# Patient Record
Sex: Female | Born: 1955
Health system: Southern US, Community
[De-identification: ages and names within clinical notes are randomized; demographics above are authoritative.]

## PROBLEM LIST (undated history)

## (undated) DIAGNOSIS — N200 Calculus of kidney: Secondary | ICD-10-CM

## (undated) DIAGNOSIS — E785 Hyperlipidemia, unspecified: Secondary | ICD-10-CM

## (undated) DIAGNOSIS — I10 Essential (primary) hypertension: Secondary | ICD-10-CM

## (undated) DIAGNOSIS — G8929 Other chronic pain: Secondary | ICD-10-CM

## (undated) DIAGNOSIS — F329 Major depressive disorder, single episode, unspecified: Secondary | ICD-10-CM

## (undated) DIAGNOSIS — G4733 Obstructive sleep apnea (adult) (pediatric): Secondary | ICD-10-CM

## (undated) DIAGNOSIS — I4892 Unspecified atrial flutter: Secondary | ICD-10-CM

## (undated) DIAGNOSIS — M5136 Other intervertebral disc degeneration, lumbar region: Secondary | ICD-10-CM

## (undated) HISTORY — DX: Other intervertebral disc degeneration, lumbar region: M51.36

## (undated) HISTORY — DX: Hyperlipidemia, unspecified: E78.5

## (undated) HISTORY — DX: Essential (primary) hypertension: I10

## (undated) HISTORY — DX: Calculus of kidney: N20.0

## (undated) HISTORY — PX: APPENDECTOMY: SHX54

## (undated) HISTORY — PX: FOOT SURGERY: SHX648

## (undated) HISTORY — PX: ABDOMINAL HYSTERECTOMY: SHX81

## (undated) HISTORY — PX: KNEE ARTHROSCOPY: SUR90

## (undated) HISTORY — DX: Obstructive sleep apnea (adult) (pediatric): G47.33

## (undated) HISTORY — DX: Major depressive disorder, single episode, unspecified: F32.9

## (undated) HISTORY — PX: SHOULDER SURGERY: SHX246

## (undated) HISTORY — DX: Other chronic pain: G89.29

## (undated) HISTORY — DX: Unspecified atrial flutter: I48.92

## (undated) HISTORY — PX: CHOLECYSTECTOMY: SHX55

## (undated) HISTORY — DX: Morbid (severe) obesity due to excess calories: E66.01

---

## 2009-12-05 ENCOUNTER — Ambulatory Visit: Payer: Self-pay | Admitting: Family Medicine

## 2009-12-05 DIAGNOSIS — E559 Vitamin D deficiency, unspecified: Secondary | ICD-10-CM | POA: Insufficient documentation

## 2009-12-05 DIAGNOSIS — N951 Menopausal and female climacteric states: Secondary | ICD-10-CM | POA: Insufficient documentation

## 2009-12-05 DIAGNOSIS — E669 Obesity, unspecified: Secondary | ICD-10-CM | POA: Insufficient documentation

## 2009-12-05 DIAGNOSIS — R5381 Other malaise: Secondary | ICD-10-CM | POA: Insufficient documentation

## 2009-12-05 DIAGNOSIS — R5383 Other fatigue: Secondary | ICD-10-CM | POA: Insufficient documentation

## 2009-12-06 LAB — CONVERTED CEMR LAB
AST: 25 units/L (ref 0–37)
Albumin: 4.2 g/dL (ref 3.5–5.2)
BUN: 19 mg/dL (ref 6–23)
Calcium: 10.7 mg/dL — ABNORMAL HIGH (ref 8.4–10.5)
Chloride: 104 meq/L (ref 96–112)
Cholesterol: 197 mg/dL (ref 0–200)
Creatinine, Ser: 0.91 mg/dL (ref 0.40–1.20)
Glucose, Bld: 107 mg/dL — ABNORMAL HIGH (ref 70–99)
HCT: 44.4 % (ref 36.0–46.0)
HDL: 40 mg/dL (ref >39)
Hemoglobin: 14.4 g/dL (ref 12.0–15.0)
MCHC: 32.4 g/dL (ref 30.0–36.0)
RBC: 4.67 M/uL (ref 3.87–5.11)
RDW: 13.1 % (ref 11.5–15.5)
TSH: 2.794 microintl units/mL (ref 0.350–4.500)
Total CHOL/HDL Ratio: 4.9
Triglycerides: 141 mg/dL (ref <150)

## 2009-12-11 ENCOUNTER — Ambulatory Visit: Payer: Self-pay | Admitting: Family Medicine

## 2009-12-11 DIAGNOSIS — E538 Deficiency of other specified B group vitamins: Secondary | ICD-10-CM | POA: Insufficient documentation

## 2009-12-13 ENCOUNTER — Encounter: Payer: Self-pay | Admitting: Family Medicine

## 2009-12-14 ENCOUNTER — Encounter: Payer: Self-pay | Admitting: Family Medicine

## 2009-12-28 ENCOUNTER — Encounter: Payer: Self-pay | Admitting: Family Medicine

## 2010-01-10 ENCOUNTER — Ambulatory Visit: Payer: Self-pay | Admitting: Family Medicine

## 2010-01-17 ENCOUNTER — Ambulatory Visit
Admission: RE | Admit: 2010-01-17 | Discharge: 2010-01-17 | Payer: Self-pay | Source: Home / Self Care | Attending: Family Medicine | Admitting: Family Medicine

## 2010-01-17 ENCOUNTER — Encounter: Payer: Self-pay | Admitting: Family Medicine

## 2010-01-17 DIAGNOSIS — Z780 Asymptomatic menopausal state: Secondary | ICD-10-CM | POA: Insufficient documentation

## 2010-01-18 ENCOUNTER — Encounter: Payer: Self-pay | Admitting: Family Medicine

## 2010-01-29 DIAGNOSIS — E210 Primary hyperparathyroidism: Secondary | ICD-10-CM | POA: Insufficient documentation

## 2010-01-29 LAB — CONVERTED CEMR LAB
Calcium, Total (PTH): 10.5 mg/dL (ref 8.4–10.5)
PTH: 120.4 pg/mL — ABNORMAL HIGH (ref 14.0–72.0)

## 2010-02-06 ENCOUNTER — Encounter: Payer: Self-pay | Admitting: Family Medicine

## 2010-02-12 ENCOUNTER — Ambulatory Visit
Admission: RE | Admit: 2010-02-12 | Discharge: 2010-02-12 | Payer: Self-pay | Source: Home / Self Care | Attending: Family Medicine | Admitting: Family Medicine

## 2010-02-12 ENCOUNTER — Encounter: Payer: Self-pay | Admitting: Endocrinology

## 2010-02-12 ENCOUNTER — Ambulatory Visit
Admission: RE | Admit: 2010-02-12 | Discharge: 2010-02-12 | Payer: Self-pay | Source: Home / Self Care | Attending: Endocrinology | Admitting: Endocrinology

## 2010-02-12 LAB — CONVERTED CEMR LAB
Calcium, Total (PTH): 10.9 mg/dL — ABNORMAL HIGH (ref 8.4–10.5)
PTH: 163.8 pg/mL — ABNORMAL HIGH (ref 14.0–72.0)
Vit D, 25-Hydroxy: 24 ng/mL — ABNORMAL LOW (ref 30–89)

## 2010-02-13 NOTE — Assessment & Plan Note (Signed)
Summary: NOV menopausal syndrome   Vital Signs:  Patient profile:   55 year old female Height:      67.5 inches Weight:      254 pounds BMI:     39.34 O2 Sat:      96 % on Room air Temp:     98.3 degrees F oral Pulse rate:   80 / minute BP sitting:   141 / 91  (left arm) Cuff size:   large  Vitals Entered By: Payton Spark CMA (December 05, 2009 10:26 AM)  O2 Flow:  Room air CC: New to est.    Primary Care Provider:  Seymour Bars DO  CC:  New to est. .  History of Present Illness: 55 yo WF presents for NOV.  She is getting over a cold currently but o/w feels well.  She has been struggling with her weight since her hysterectomy with oophorectomy at age 55 for fibroids.  She has a fair diet and has tried 'diets' in the past and lost wt but every time, has regained.  She is currently not exercising.  Her mood has been up and down following a move here this year from [place] with her 3 children.  She has been on Estradiol x 5 yrs but continues to have hot flashes.  She ran out about 2 mos ago.  She is due for fasting labs, a mammogram, DEXA, etc.  Current Medications (verified): 1)  Estradiol 1 Mg Tabs (Estradiol) .... Take 1 Tab By Mouth Once Daily  Allergies (verified): 1)  ! Aleve (Naproxen Sodium) 2)  ! Diflucan (Fluconazole)  Past History:  Past Medical History: obesity kidney stones  Past Surgical History: appendectomy 05/29/76 arthroscopic knee surgery TAH with BSO for fibroids at age 55 cholecystectomy RTC repair (L ) 05-30-2007  Family History: mother died in her 52s, CHF, DM, CVA, HTN father died in his 75s ?  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  Has 3 daughters. Never smoked. Denies ETOH. No exercise.  Fair diet.  Review of Systems       no fevers/sweats/weakness, unexplained wt loss/gain, no change in vision, no difficulty hearing, ringing in ears, no hay fever/allergies, no CP/discomfort, no  palpitations, no breast lump/nipple discharge, no cough/wheeze, no blood in stool, no N/V/D, no nocturia, no leaking urine, no unusual vag bleeding, no vaginal/penile discharge, no muscle/joint pain, no rash, no new/changing mole, no HA, no memory loss, no anxiety, no sleep problem, no depression, no unexplained lumps, no easy bruising/bleeding, no concern with sexual function   Physical Exam  General:  alert, well-developed, well-nourished, and well-hydrated.  obese Head:  normocephalic and atraumatic.   Eyes:  conjunctiva clear Mouth:  pharynx pink and moist.  o/p injected Neck:  no masses.   Lungs:  Normal respiratory effort, chest expands symmetrically. Lungs are clear to auscultation, no crackles or wheezes. Heart:  Normal rate and regular rhythm. S1 and S2 normal without gallop, murmur, click, rub or other extra sounds. Skin:  color normal.   Cervical Nodes:  No lymphadenopathy noted Psych:  good eye contact, not anxious appearing, and flat affect.     Impression & Recommendations:  Problem # 1:  MENOPAUSAL SYNDROME (ICD-627.2) On HRT x 5 yrs following a TAH with BSO for fibroids.  Off Estradiol x 2 mos.  We discussed options and I think she would do better with bioidentical hormones.  Info given re: Med Environmental education officer.  Will update her labs today and will schedule a CPE in 6 wks and plan to do DEXA/ Mammo/ Colonoscopy/ immunizations if needed/ EKG.    Her updated medication list for this problem includes:    Estradiol 1 Mg Tabs (Estradiol) .[name]... Take 1 tab by mouth once daily  Complete Medication List: 1)  Estradiol 1 Mg Tabs (Estradiol) .... Take 1 tab by mouth once daily  Other Orders: T-CBC No Diff (16109-60454) T-TSH (09811-91478) T-Lipid Profile (29562-13086) T-Comprehensive Metabolic Panel [phone]) T-Vitamin B12 [id number]) T-Vitamin D (25-Hydroxy) (02725-36644)  Patient Instructions: 1)  Fasting labs today. 2)  Will call you w/ results  tomorrow. 3)  Check out bioidentical hormone testing/ replacement at [facility] in [place]. 4)  Call me if you have any questions. 5)  Set up a PHYSICAL in 6 wks.   Orders Added: 1)  T-CBC No Diff [[id number]] 2)  T-TSH [03474-25956] 3)  T-Lipid Profile [38756-43329] 4)  T-Comprehensive Metabolic Panel [80053-22900] 5)  T-Vitamin B12 [82607-23330] 6)  T-Vitamin D (25-Hydroxy) [51884-16606] 7)  New Patient Level III [30160]

## 2010-02-13 NOTE — Assessment & Plan Note (Signed)
Summary: B12 shot  Nurse Visit   Vitals Entered By: Payton Spark CMA (December 11, 2009 9:58 AM)  Allergies: 1)  ! Aleve (Naproxen Sodium) 2)  ! Diflucan (Fluconazole)  Medication Administration  Injection # 1:    Medication: Vit B12 1000 mcg    Diagnosis: UNSPECIFIED VITAMIN D DEFICIENCY (ICD-268.9)    Route: IM    Site: R deltoid    Exp Date: 09/2011    Lot #: [id number]    Patient tolerated injection without complications    Given by: Payton Spark CMA (December 11, 2009 9:58 AM)  Orders Added: 1)  Vit B12 1000 mcg [J3420] 2)  Admin of Therapeutic Inj  intramuscular or subcutaneous [96372] Prescriptions: ESTRADIOL 1 MG TABS (ESTRADIOL) Take 1 tab by mouth once daily  #30 x 1   Entered by:   Payton Spark CMA   Authorized by:   Seymour Bars DO   Signed by:   Payton Spark CMA on 12/11/2009   Method used:   Electronically to        [facility] [phone]* (retail)       [address] [place], [place]  [address]       Ph: [phone]       Fax: [phone]   RxID:   [id number]    Medication Administration  Injection # 1:    Medication: Vit B12 1000 mcg    Diagnosis: UNSPECIFIED VITAMIN D DEFICIENCY (ICD-268.9)    Route: IM    Site: R deltoid    Exp Date: 09/2011    Lot #: [id number]    Patient tolerated injection without complications    Given by: Payton Spark CMA (December 11, 2009 9:58 AM)  Orders Added: 1)  Vit B12 1000 mcg [J3420] 2)  Admin of Therapeutic Inj  intramuscular or subcutaneous [27253]

## 2010-02-15 NOTE — Medication Information (Signed)
Summary: Estradiol & Progesterone/[facility]  Estradiol & Progesterone/[facility]   Imported By: Lanelle Bal 01/12/2010 12:14:20  _____________________________________________________________________  External Attachment:    Type:   Image     Comment:   External Document

## 2010-02-15 NOTE — Assessment & Plan Note (Signed)
Summary: B-12 SHO  Nurse Visit   Vitals Entered By: Payton Spark CMA (January 10, 2010 10:06 AM)  Allergies: 1)  ! Aleve (Naproxen Sodium) 2)  ! Diflucan (Fluconazole)  Medication Administration  Injection # 1:    Medication: Vit B12 1000 mcg    Diagnosis: B12 DEFICIENCY (ICD-266.2)    Route: IM    Site: L deltoid    Exp Date: 08/2011    Lot #: [id number]    Patient tolerated injection without complications    Given by: Payton Spark CMA (January 10, 2010 10:07 AM)  Orders Added: 1)  Vit B12 1000 mcg [J3420] 2)  Admin of Therapeutic Inj  intramuscular or subcutaneous [96372]   Medication Administration  Injection # 1:    Medication: Vit B12 1000 mcg    Diagnosis: B12 DEFICIENCY (ICD-266.2)    Route: IM    Site: L deltoid    Exp Date: 08/2011    Lot #: [id number]    Patient tolerated injection without complications    Given by: Payton Spark CMA (January 10, 2010 10:07 AM)  Orders Added: 1)  Vit B12 1000 mcg [J3420] 2)  Admin of Therapeutic Inj  intramuscular or subcutaneous [[id number]]

## 2010-02-15 NOTE — Miscellaneous (Signed)
Summary: colonoscopy  Clinical Lists Changes  Observations: Added new observation of COLONRECACT: Further recommendations pending biopsy results.   (02/01/2010 12:55) Added new observation of COLONOSCOPY: Location:  [facility].    polyps in the sigmoid colon ( polypectomy) internal hemorrhoids o/w normal to distal 5 cm of TI  (02/01/2010 12:55)      Colonoscopy  Procedure date:  02/01/2010  Findings:      Location:  [facility].    polyps in the sigmoid colon ( polypectomy) internal hemorrhoids o/w normal to distal 5 cm of TI   Comments:      Further recommendations pending biopsy results.     Colonoscopy  Procedure date:  02/01/2010  Findings:      Location:  [facility].    polyps in the sigmoid colon ( polypectomy) internal hemorrhoids o/w normal to distal 5 cm of TI   Comments:      Further recommendations pending biopsy results.

## 2010-02-15 NOTE — Letter (Signed)
Summary: New Patient Info/[facility]  New Patient Info/[facility]   Imported By: Lanelle Bal 01/12/2010 12:11:11  _____________________________________________________________________  External Attachment:    Type:   Image     Comment:   External Document

## 2010-02-15 NOTE — Assessment & Plan Note (Signed)
Summary: CPE   Vital Signs:  Patient profile:   55 year old female Menstrual status:  hysterectomy Height:      67.5 inches Weight:      257 pounds BMI:     39.80 O2 Sat:      100 % on Room air Pulse rate:   67 / minute BP sitting:   150 / 77  (left arm) Cuff size:   large  Vitals Entered By: Payton Spark CMA (January 17, 2010 10:21 AM)  O2 Flow:  Room air CC: CPE      Menstrual Status hysterectomy   Primary Care Provider:  Seymour Bars DO  CC:  CPE .  History of Present Illness: 55 yo WF presents for CPE.    She is s/p TAH with oophorectomy for fibroids.  Recently replaced her oral estradiol withh compounded topical estrogen and progesterone creams thru [facility].  She still c/o fatigue.  She started B12 injections 2 mos ago for low B12.  Her TSH was normal.  Her Vit D was low.  Cholesterol OK other than a low HDL and she had a mildly elevated calcium, due to recheck today with an intact PTH.  She is due for her mammogram, DEXA and a screening colonoscopy. She had fasting labs in Nov. Denies fam hx of premature CVD, breast or colon cancer.  She continues to struggle with her weight along with daytime sleepiness, snoring and no exercise.    Current Medications (verified): 1)  Vitamin D 50,000 Iu Capsules .Marland KitchenMarland Kitchen. 1 Capsule By Mouth Once A Week 2)  Estradiol Cream .... Use As Directed 3)  Progesterone Cream .... Use As Directed  Allergies (verified): 1)  ! Aleve (Naproxen Sodium) 2)  ! Diflucan (Fluconazole)  Past History:  Past Medical History: Reviewed history from 12/05/2009 and no changes required. obesity kidney stones  Past Surgical History: Reviewed history from 12/05/2009 and no changes required. appendectomy 1978 arthroscopic knee surgery TAH with BSO for fibroids at age 67 cholecystectomy RTC repair (L ) 2009  Family History: Reviewed history from 12/05/2009 and no changes required. mother died in her 68s, CHF, DM, CVA, HTN father died in  his 74s ?  brother died in his 53s- DM 2 brothers Alive  Social History: Reviewed history from 12/05/2009 and no changes required. 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  Has 3 daughters. Never smoked. Denies ETOH. No exercise.  Fair diet.  Review of Systems       The patient complains of weight gain and headaches.  The patient denies anorexia, fever, weight loss, vision loss, decreased hearing, hoarseness, chest pain, syncope, dyspnea on exertion, peripheral edema, prolonged cough, hemoptysis, abdominal pain, melena, hematochezia, severe indigestion/heartburn, hematuria, incontinence, genital sores, muscle weakness, suspicious skin lesions, transient blindness, difficulty walking, depression, unusual weight change, abnormal bleeding, enlarged lymph nodes, angioedema, breast masses, and testicular masses.    Physical Exam  General:  alert, well-developed, well-nourished, and well-hydrated.  obese Head:  normocephalic and atraumatic.   Eyes:  pupils equal, pupils round, and pupils reactive to light.  wears glasses Ears:  no external deformities.   Nose:  no nasal discharge.   Mouth:  good dentition and pharynx pink and moist.  low hanging uvula with mild underbite Neck:  supple and no masses.   Breasts:  No mass, nodules, thickening, tenderness, bulging, retraction, inflamation, nipple discharge or skin changes noted.   Lungs:  Normal respiratory effort, chest expands symmetrically. Lungs are clear to auscultation, no crackles or wheezes.  Heart:  Normal rate and regular rhythm. S1 and S2 normal without gallop, murmur, click, rub or other extra sounds. Abdomen:  Bowel sounds positive,abdomen soft and non-tender without masses, organomegaly or hernias noted. Pulses:  2+ radial and pedal pulses no AA bruits Extremities:  no LE edema Skin:  color normal and no suspicious lesions.   Cervical Nodes:  No lymphadenopathy noted Psych:  good eye contact, not anxious appearing, and not depressed appearing.      Impression & Recommendations:  Problem # 1:  HEALTH MAINTENANCE EXAM (ICD-V70.0) Keeping healthy checklist for women reviewed. BP high -- she will check at home and call me with home readings. Update mammogram and DEXA. Update Tdap and flu shot today. Repeat Calcium with iPTH (hypercalcemia on labs in Nov). BMI high at 39.8 c/w class II obesity.  Given obesity, fatigue --> will screen for OSA with a sleep study. Continue B12 injections + RX Vit D + MVI daily.  Repeat B12 and Vit D levels at 2 mos f/u visit. Work on [employer], regular exercise, wt loss.   Complete Medication List: 1)  Vitamin D 50,000 Iu Capsules  .[name].. 1 capsule by mouth once a week 2)  Estradiol Cream  .... Use as directed 3)  Progesterone Cream  .... Use as directed  Other Orders: Sleep Study (Sleep Study) Gastroenterology Referral (GI) T-Mammography Bilateral Screening (16109) T-Dual DXA Bone Density/ Axial (60454) T-DXA Bone Density/ Appendicular (09811)  Patient Instructions: 1)  Will set up Sleep study and colonoscopy. 2)  Tetanus vaccine updated today. 3)  Repeat calcium level today. 4)  Will call you w/ result tomorrow. 5)  Update mammogram and DEXA downstairs.   6)  Return for follow up fatigue/ WT/ BP in 2 mos.   Orders Added: 1)  Sleep Study [Sleep Study] 2)  Gastroenterology Referral [GI] 3)  T-Mammography Bilateral Screening [77057] 4)  T-Dual DXA Bone Density/ Axial [77080] 5)  T-DXA Bone Density/ Appendicular [77081] 6)  Est. Patient age 28-64 [64]  Appended Document: CPE     Allergies: 1)  ! Aleve (Naproxen Sodium) 2)  ! Diflucan (Fluconazole)   Complete Medication List: 1)  Vitamin D 50,000 Iu Capsules  .[name].. 1 capsule by mouth once a week 2)  Estradiol Cream  .... Use as directed 3)  Progesterone Cream  .... Use as directed  Other Orders: Tdap => 38yrs IM (91478) Admin 1st Vaccine (29562) Admin 1st Vaccine (13086) Flu Vaccine 88yrs + [id number])   Orders Added: 1)   Tdap => 30yrs IM [90715] 2)  Admin 1st Vaccine [90471] 3)  Admin 1st Vaccine [90471] 4)  Flu Vaccine 51yrs + [96295]   Immunizations Administered:  Tetanus Vaccine:    Vaccine Type: Tdap    Site: right deltoid    Dose: 0.5 ml    Route: IM    Given by: Payton Spark CMA    Exp. Date: 11/03/2011    Lot #: [id number]    VIS given: 12/02/07 version given January 17, 2010.   Immunizations Administered:  Tetanus Vaccine:    Vaccine Type: Tdap    Site: right deltoid    Dose: 0.5 ml    Route: IM    Given by: Payton Spark CMA    Exp. Date: 11/03/2011    Lot #: [id number]    VIS given: 12/02/07 version given January 17, 2010. Flu Vaccine Consent Questions     Do you have a history of severe allergic reactions to this vaccine? no  Any prior history of allergic reactions to egg and/or gelatin? no    Do you have a sensitivity to the preservative Thimersol? no    Do you have a past history of Guillan-Barre Syndrome? no    Do you currently have an acute febrile illness? no    Have you ever had a severe reaction to latex? no    Vaccine information given and explained to patient? yes    Are you currently pregnant? no    Lot Number:[id number]   Exp Date:07/14/2010   Site Given  Left Deltoid [id number]    VIS given: 12/02/07 version given January 17, 2010.     Marland Kitchenlbflu

## 2010-02-19 ENCOUNTER — Encounter (HOSPITAL_BASED_OUTPATIENT_CLINIC_OR_DEPARTMENT_OTHER): Payer: Self-pay

## 2010-02-19 ENCOUNTER — Encounter: Payer: Self-pay | Admitting: Family Medicine

## 2010-02-21 NOTE — Assessment & Plan Note (Signed)
Summary: B-2 SHOT-VEW  Nurse Visit   Vitals Entered By: Payton Spark CMA (February 12, 2010 10:00 AM)  Allergies: 1)  ! Aleve (Naproxen Sodium) 2)  ! Diflucan (Fluconazole)  Medication Administration  Injection # 1:    Medication: Vit B12 1000 mcg    Diagnosis: B12 DEFICIENCY (ICD-266.2)    Route: IM    Site: R deltoid    Exp Date: 10/2011    Lot #: [id number]    Patient tolerated injection without complications    Given by: Payton Spark CMA (February 12, 2010 10:00 AM)  Orders Added: 1)  Vit B12 1000 mcg [J3420] 2)  Admin of Therapeutic Inj  intramuscular or subcutaneous [96372]   Medication Administration  Injection # 1:    Medication: Vit B12 1000 mcg    Diagnosis: B12 DEFICIENCY (ICD-266.2)    Route: IM    Site: R deltoid    Exp Date: 10/2011    Lot #: [id number]    Patient tolerated injection without complications    Given by: Payton Spark CMA (February 12, 2010 10:00 AM)  Orders Added: 1)  Vit B12 1000 mcg [J3420] 2)  Admin of Therapeutic Inj  intramuscular or subcutaneous [66440]

## 2010-03-01 NOTE — Assessment & Plan Note (Signed)
Summary: NEW [facility]-HYPERPARATHY-HYPERCALCEMIA-JENNIFER/DR BOWEN-#-PKG-STC   Vital Signs:  Patient profile:   55 year old female Menstrual status:  hysterectomy Height:      67.5 inches (171.45 cm) Weight:      259.13 pounds (117.79 kg) BMI:     40.13 O2 Sat:      93 % on Room air Temp:     98.6 degrees F (37.00 degrees C) oral Pulse rate:   67 / minute BP sitting:   114 / 80  (left arm) Cuff size:   large  Vitals Entered By: Brenton Grills CMA Duncan Dull) (February 12, 2010 3:45 PM)  O2 Flow:  Room air CC: New Endo Consult/Hyperparathyroid/Hypercalcemia/aj Is Patient Diabetic? No   Referring Provider:  Seymour Bars DO Primary Provider:  Seymour Bars DO  CC:  New Endo Consult/Hyperparathyroid/Hypercalcemia/aj.  History of Present Illness: pt states few weeks of moderate gerd sxs in  the chest, and assoc fatigue.  she was noted to have low b-12, low vit-d, high pth, and hypercalcemia.   she had urolithiasis 4 years--she has eswl (in [place]).   no records are available, but pt believes her ca++ was normal as recently as 1 year ago.    Current Medications (verified): 1)  Vitamin D 50,000 Iu Capsules .Marland KitchenMarland Kitchen. 1 Capsule By Mouth Once A Week 2)  Estradiol Cream .... Use As Directed 3)  Progesterone Cream .... Use As Directed 4)  Vitamin D 1000 Unit Tabs (Cholecalciferol) .[name].. 1 Tablet By Mouth Once Daily 5)  Cyanocobalamin 1000 Mcg/ml Soln (Cyanocobalamin) .[name].. 1 Injection Im Once Monthly  Allergies (verified): 1)  ! Aleve (Naproxen Sodium) 2)  ! Diflucan (Fluconazole)  Past History:  Past Medical History: PRIMARY HYPERPARATHYROIDISM (ICD-252.01) HEALTH MAINTENANCE EXAM (ICD-V70.0) OTHER SCREENING MAMMOGRAM (ICD-V76.12) POSTMENOPAUSAL STATUS (ICD-V49.81) SPECIAL SCREENING FOR MALIGNANT NEOPLASMS COLON (ICD-V76.51) B12 DEFICIENCY (ICD-266.2) HYPERCALCEMIA (ICD-275.42) OBESITY, UNSPECIFIED (ICD-278.00) MENOPAUSAL SYNDROME (ICD-627.2) OTH&UNSPEC ENDOCRN NUTRIT METAB&IMMUNITY D/O  (ICD-V77.99) SCREENING FOR LIPOID DISORDERS (ICD-V77.91) UNSPECIFIED VITAMIN D DEFICIENCY (ICD-268.9) FATIGUE (ICD-780.79)  Past Surgical History: appendectomy 1978 arthroscopic knee surgery-1997 TAH with BSO for fibroids at age 14 cholecystectomy-2007 RTC repair (L ) 2009 Hysterectomy-2003  Family History: Reviewed history from 12/05/2009 and no changes required. mother died in her 38s, CHF, DM, CVA, HTN father died in his 72s ?  brother died in his 46s- DM 2 brothers Alive.   neg for parathyroid probs.  Social History: Reviewed history from 12/05/2009 and no changes required. 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 Never smoked. Denies ETOH. No exercise.  Fair diet.  Review of Systems  The patient denies headaches.         denies weight loss, galactorrhea, hematuria, memory loss, numbness, abdominal pain, urinary frequency, hypoglycemia, skin rash, visual loss, sob, constipation, rhinorrhea,and depression.  she reports snoring (sleep study is scheduled).  she has no menses since tah at age 26.  she has a few diffuse arthralgias, easy bruising, and muscle weakness.   Physical Exam  General:  obese.  no distress  Head:  head: no deformity eyes: no periorbital swelling, no proptosis external nose and ears are normal mouth: no lesion seen Neck:  Supple without thyroid enlargement or tenderness.  Lungs:  Clear to auscultation bilaterally. Normal respiratory effort.  Heart:  Regular rate and rhythm without murmurs or gallops noted. Normal S1,S2.   Abdomen:  abdomen is soft, nontender.  no hepatosplenomegaly.   not distended.  no hernia  Msk:  muscle bulk and strength are grossly normal.  no obvious joint  swelling.  gait is normal and steady  Extremities:  no deformity.  no ulcer on the feet.  feet are of normal color and temp.   trace right pedal edema and trace left pedal edema.   Neurologic:  cn 2-12 grossly intact.   readily moves all 4's.   sensation is  intact to touch on the feet  Skin:  normal texture and temp.  no rash.  not diaphoretic  Cervical Nodes:  No significant adenopathy.  Psych:  Alert and cooperative; normal mood and affect; normal attention span and concentration.     Impression & Recommendations:  Problem # 1:  PRIMARY HYPERPARATHYROIDISM (ICD-252.01) in view of young age and urolithiasis, she needs surgery.  Problem # 2:  UNSPECIFIED VITAMIN D DEFICIENCY (ICD-268.9) she should continue supplement, despite the fact that it could exac hypecalcemia.  Problem # 3:  gerd sxs could be exac by #1  Medications Added to Medication List This Visit: 1)  Vitamin D 1000 Unit Tabs (Cholecalciferol) .[name].. 1 tablet by mouth once daily 2)  Cyanocobalamin 1000 Mcg/ml Soln (Cyanocobalamin) .[name].. 1 injection im once monthly  Other Orders: T-Parathyroid Hormone, Intact w/ Calcium (95284-13244) T-Vitamin D (25-Hydroxy) (01027-25366) Surgical Referral (Surgery) Consultation Level IV (44034)  Patient Instructions: 1)  blood tests are being ordered for you today.  please call [phone] to hear your test results. 2)  refer to surgery.  you will be called with a day and time for an appointment. 3)  (update: i left message on phone-tree:  rx as we discussed.  continue vit-d supplement)   Orders Added: 1)  T-Parathyroid Hormone, Intact w/ Calcium [38756-43329] 2)  T-Vitamin D (25-Hydroxy) [51884-16606] 3)  Surgical Referral [Surgery] 4)  Consultation Level IV [30160]

## 2010-03-07 NOTE — Letter (Signed)
Summary: Letter with Path Results/[facility]  Letter with Path Results/[facility]   Imported By: Lanelle Bal 02/27/2010 12:41:30  _____________________________________________________________________  External Attachment:    Type:   Image     Comment:   External Document

## 2010-03-12 ENCOUNTER — Ambulatory Visit: Payer: Self-pay | Admitting: Family Medicine

## 2010-03-12 ENCOUNTER — Ambulatory Visit (INDEPENDENT_AMBULATORY_CARE_PROVIDER_SITE_OTHER): Payer: Commercial Managed Care - PPO

## 2010-03-12 ENCOUNTER — Encounter: Payer: Self-pay | Admitting: Family Medicine

## 2010-03-12 DIAGNOSIS — E538 Deficiency of other specified B group vitamins: Secondary | ICD-10-CM

## 2010-03-16 ENCOUNTER — Ambulatory Visit: Payer: Self-pay | Admitting: Family Medicine

## 2010-03-16 ENCOUNTER — Encounter: Payer: Self-pay | Admitting: Endocrinology

## 2010-03-16 ENCOUNTER — Other Ambulatory Visit (HOSPITAL_COMMUNITY): Payer: Self-pay | Admitting: Surgery

## 2010-03-16 DIAGNOSIS — E213 Hyperparathyroidism, unspecified: Secondary | ICD-10-CM

## 2010-03-22 NOTE — Assessment & Plan Note (Signed)
Summary: B12/dt  Nurse Visit   Vitals Entered By: Payton Spark CMA (March 12, 2010 10:16 AM)  Allergies: 1)  ! Aleve (Naproxen Sodium) 2)  ! Diflucan (Fluconazole)  Medication Administration  Injection # 1:    Medication: Vit B12 1000 mcg    Diagnosis: B12 DEFICIENCY (ICD-266.2)    Route: IM    Site: R deltoid    Patient tolerated injection without complications    Given by: Payton Spark CMA (March 12, 2010 10:17 AM)  Orders Added: 1)  Vit B12 1000 mcg [J3420] 2)  Admin of Therapeutic Inj  intramuscular or subcutaneous [96372]   Medication Administration  Injection # 1:    Medication: Vit B12 1000 mcg    Diagnosis: B12 DEFICIENCY (ICD-266.2)    Route: IM    Site: R deltoid    Patient tolerated injection without complications    Given by: Payton Spark CMA (March 12, 2010 10:17 AM)  Orders Added: 1)  Vit B12 1000 mcg [J3420] 2)  Admin of Therapeutic Inj  intramuscular or subcutaneous [16109]

## 2010-03-22 NOTE — Letter (Signed)
Summary: Patient Cancellation/[facility]  Patient Cancellation/[facility]   Imported By: Lanelle Bal 03/15/2010 12:36:13  _____________________________________________________________________  External Attachment:    Type:   Image     Comment:   External Document

## 2010-03-23 ENCOUNTER — Encounter: Payer: Self-pay | Admitting: Family Medicine

## 2010-03-23 ENCOUNTER — Other Ambulatory Visit: Payer: Self-pay | Admitting: Family Medicine

## 2010-03-23 ENCOUNTER — Ambulatory Visit (INDEPENDENT_AMBULATORY_CARE_PROVIDER_SITE_OTHER): Payer: Commercial Managed Care - PPO | Admitting: Family Medicine

## 2010-03-23 DIAGNOSIS — E210 Primary hyperparathyroidism: Secondary | ICD-10-CM

## 2010-03-23 DIAGNOSIS — E0590 Thyrotoxicosis, unspecified without thyrotoxic crisis or storm: Secondary | ICD-10-CM

## 2010-03-23 DIAGNOSIS — Z1231 Encounter for screening mammogram for malignant neoplasm of breast: Secondary | ICD-10-CM

## 2010-03-23 DIAGNOSIS — E669 Obesity, unspecified: Secondary | ICD-10-CM

## 2010-03-27 NOTE — Letter (Signed)
Summary: Dr Gerrit Friends Visit Note   Dr Gerrit Friends Visit Note   Imported By: Kassie Mends 03/23/2010 09:36:36  _____________________________________________________________________  External Attachment:    Type:   Image     Comment:   External Document

## 2010-03-27 NOTE — Assessment & Plan Note (Signed)
Summary: f/u hyperparathyroidism/ DEXA   Vital Signs:  Patient profile:   55 year old female Menstrual status:  hysterectomy Height:      67.5 inches Weight:      259 pounds BMI:     40.11 O2 Sat:      100 % on Room air Pulse rate:   62 / minute BP sitting:   128 / 78  (left arm) Cuff size:   large  Vitals Entered By: Payton Spark CMA (March 23, 2010 10:28 AM)  O2 Flow:  Room air CC: F/U fatigue, weight and BP   Primary Care Provider:  Seymour Bars DO  CC:  F/U fatigue and weight and BP.  History of Present Illness: 55 yo WF presents for f/u visit.  She has seen Dr Everardo All and then Dr Gerrit Friends for findings of primary hyperparathyroidism.  She continues to feel tired.  Her BP looks good.  She is set up for a nuclear test next wk and she did complete a 24 hr urine for calcium.  She is due for bone density scan.  Needs to RF her RX vitamin D.  She has yet to start exercise due to low energy level.  Has started to make dietary changes.     Current Medications (verified): 1)  Vitamin D 50,000 Iu Capsules .Marland KitchenMarland Kitchen. 1 Capsule By Mouth Once A Week 2)  Estradiol Cream .... Use As Directed 3)  Progesterone Cream .... Use As Directed 4)  Vitamin D 1000 Unit Tabs (Cholecalciferol) .[name].. 1 Tablet By Mouth Once Daily 5)  Cyanocobalamin 1000 Mcg/ml Soln (Cyanocobalamin) .[name].. 1 Injection Im Once Monthly  Allergies (verified): 1)  ! Aleve (Naproxen Sodium) 2)  ! Diflucan (Fluconazole)  Past History:  Past Medical History: PRIMARY HYPERPARATHYROIDISM (ICD-252.01) POSTMENOPAUSAL STATUS (ICD-V49.81) B12 DEFICIENCY (ICD-266.2) HYPERCALCEMIA (ICD-275.42) OBESITY, UNSPECIFIED (ICD-278.00) UNSPECIFIED VITAMIN D DEFICIENCY (ICD-268.9) FATIGUE (ICD-780.79)  Past Surgical History: Reviewed history from 02/12/2010 and no changes required. appendectomy 1978 arthroscopic knee surgery-1997 TAH with BSO for fibroids at age 55 cholecystectomy-2007 RTC repair (L ) 2009 Hysterectomy-2003  Social  History: Reviewed history from 12/05/2009 and no changes required. SAHM. Married to [place].  Has 3 daughters. Never smoked. Denies ETOH. No exercise.  Fair diet.  Review of Systems      See HPI  Physical Exam  General:  alert, well-developed, well-nourished, well-hydrated, and overweight-appearing.   Head:  normocephalic and atraumatic.   Mouth:  pharynx pink and moist.   Neck:  no masses and no thyromegaly.   Lungs:  Normal respiratory effort, chest expands symmetrically. Lungs are clear to auscultation, no crackles or wheezes. Heart:  Normal rate and regular rhythm. S1 and S2 normal without gallop, murmur, click, rub or other extra sounds. Msk:  no joint swelling, no joint warmth, and no redness over joints.   Extremities:  trace LE edema bilat Neurologic:  no tremor Skin:  color normal.   Cervical Nodes:  No lymphadenopathy noted Psych:  good eye contact, not anxious appearing, and not depressed appearing.     Impression & Recommendations:  Problem # 1:  PRIMARY HYPERPARATHYROIDISM (ICD-252.01) REviewed notes from Dr Everardo All and Dr Gerrit Friends.  Awaiting results from her 24 hr urine for calcium and is scheduled for a nuclear test next wk.  Will go ahead and order her DEXA scan and renew her RX vit D.  Plan to recheck her Vit D and B12 level at f/u visit. Orders: T-DXA Bone Density/ Appendicular ([address]) T-Dual DXA Bone Density/ Axial ([address])  Problem # 2:  OBESITY, UNSPECIFIED (ICD-278.00) Will see her back in 2-3 mos and hopefully once her energy level has improv3ed, she is more motivated to work on diet and exercise.  Complete Medication List: 1)  Vitamin D 50,000 Iu Capsules  .[name].. 1 capsule by mouth once a week 2)  Estradiol Cream  .... Use as directed 3)  Progesterone Cream  .... Use as directed 4)  Vitamin D 1000 Unit Tabs (Cholecalciferol) .[name].. 1 tablet by mouth once daily 5)  Cyanocobalamin 1000 Mcg/ml Soln (Cyanocobalamin) .[name].. 1 injection im once monthly  Other  Orders: T-Mammography Bilateral Screening (45409)  Patient Instructions: 1)  Will update your mammogram and DEXA downstairs. 2)  Stay on once a wk RX vitamin D. 3)  Return for f/u in 3 mos. Prescriptions: VITAMIN D 50,000 IU CAPSULES 1 capsule by mouth once a week  #12 x 0   Entered and Authorized by:   Seymour Bars DO   Signed by:   Seymour Bars DO on 03/23/2010   Method used:   Printed then faxed to ...       [facility] [address] [facility] [phone]* (retail)       [address] [place], [place]  [address]       Ph: [phone]       Fax: [phone]   RxID:   [id number]    Orders Added: 1)  T-Mammography Bilateral Screening [77057] 2)  T-DXA Bone Density/ Appendicular [77081] 3)  T-Dual DXA Bone Density/ Axial [77080] 4)  Est. Patient Level III [64403]

## 2010-03-28 ENCOUNTER — Ambulatory Visit (HOSPITAL_COMMUNITY): Payer: Commercial Managed Care - PPO

## 2010-03-28 ENCOUNTER — Encounter (HOSPITAL_COMMUNITY)
Admission: RE | Admit: 2010-03-28 | Discharge: 2010-03-28 | Disposition: A | Discharge status: Home / Self Care | Payer: Commercial Managed Care - PPO | Source: Ambulatory Visit | Attending: Surgery | Admitting: Surgery

## 2010-03-28 ENCOUNTER — Encounter (HOSPITAL_COMMUNITY): Payer: Self-pay

## 2010-03-28 DIAGNOSIS — E213 Hyperparathyroidism, unspecified: Secondary | ICD-10-CM

## 2010-03-28 MED ORDER — TECHNETIUM TC 99M SESTAMIBI - CARDIOLITE
24.8000 | Freq: Once | INTRAVENOUS | Status: AC | PRN
  Administered 2010-03-28: 08:00:00 24.8 via INTRAVENOUS

## 2010-04-03 ENCOUNTER — Ambulatory Visit
Admission: RE | Admit: 2010-04-03 | Discharge: 2010-04-03 | Disposition: A | Discharge status: Home / Self Care | Payer: Commercial Managed Care - PPO | Source: Ambulatory Visit | Attending: Family Medicine | Admitting: Family Medicine

## 2010-04-03 DIAGNOSIS — Z1231 Encounter for screening mammogram for malignant neoplasm of breast: Secondary | ICD-10-CM

## 2010-04-03 DIAGNOSIS — E0590 Thyrotoxicosis, unspecified without thyrotoxic crisis or storm: Secondary | ICD-10-CM

## 2010-04-09 ENCOUNTER — Ambulatory Visit (INDEPENDENT_AMBULATORY_CARE_PROVIDER_SITE_OTHER): Payer: 59 | Admitting: Family Medicine

## 2010-04-09 DIAGNOSIS — E538 Deficiency of other specified B group vitamins: Secondary | ICD-10-CM

## 2010-04-09 MED ORDER — CYANOCOBALAMIN 1000 MCG/ML IJ SOLN
1000.0000 ug | INTRAMUSCULAR | Status: DC
  Administered 2010-04-09: 1000 ug via INTRAMUSCULAR

## 2010-04-11 ENCOUNTER — Encounter (HOSPITAL_COMMUNITY): Payer: 59 | Attending: Surgery

## 2010-04-11 ENCOUNTER — Other Ambulatory Visit: Payer: Self-pay | Admitting: Surgery

## 2010-04-11 DIAGNOSIS — Z01812 Encounter for preprocedural laboratory examination: Secondary | ICD-10-CM | POA: Insufficient documentation

## 2010-04-11 DIAGNOSIS — Z01810 Encounter for preprocedural cardiovascular examination: Secondary | ICD-10-CM | POA: Insufficient documentation

## 2010-04-11 LAB — BASIC METABOLIC PANEL
BUN: 11 mg/dL (ref 6–23)
CO2: 28 mEq/L (ref 19–32)
Calcium: 10.5 mg/dL (ref 8.4–10.5)
Chloride: 105 mEq/L (ref 96–112)
Creatinine, Ser: 0.77 mg/dL (ref 0.4–1.2)
GFR calc Af Amer: >60 mL/min (ref >60)
GFR calc non Af Amer: >60 mL/min (ref >60)
Glucose, Bld: 102 mg/dL — ABNORMAL HIGH (ref 70–99)
Potassium: 4.3 mEq/L (ref 3.5–5.1)
Sodium: 140 mEq/L (ref 135–145)

## 2010-04-11 LAB — URINALYSIS, ROUTINE W REFLEX MICROSCOPIC
Bilirubin Urine: NEGATIVE
Glucose, UA: NEGATIVE mg/dL
Hgb urine dipstick: NEGATIVE
Ketones, ur: NEGATIVE mg/dL
Nitrite: NEGATIVE
Protein, ur: NEGATIVE mg/dL
Specific Gravity, Urine: 1.026 (ref 1.005–1.030)
Urobilinogen, UA: 0.2 mg/dL (ref 0.0–1.0)
pH: 5.5 (ref 5.0–8.0)

## 2010-04-11 LAB — CBC
MCHC: 31.7 g/dL (ref 30.0–36.0)
MCV: 93.9 fL (ref 78.0–100.0)
Platelets: 244 10*3/uL (ref 150–400)
RDW: 12.4 % (ref 11.5–15.5)
WBC: 7.7 10*3/uL (ref 4.0–10.5)

## 2010-04-11 LAB — DIFFERENTIAL
Basophils Absolute: 0 10*3/uL (ref 0.0–0.1)
Eosinophils Absolute: 0.1 10*3/uL (ref 0.0–0.7)
Eosinophils Relative: 1 % (ref 0–5)
Monocytes Absolute: 0.4 10*3/uL (ref 0.1–1.0)

## 2010-04-11 LAB — PROTIME-INR: INR: 0.98 (ref 0.00–1.49)

## 2010-04-11 LAB — SURGICAL PCR SCREEN: MRSA, PCR: NEGATIVE

## 2010-04-15 HISTORY — PX: PARATHYROIDECTOMY: SHX19

## 2010-04-17 ENCOUNTER — Other Ambulatory Visit: Payer: Self-pay | Admitting: Surgery

## 2010-04-17 ENCOUNTER — Ambulatory Visit (HOSPITAL_COMMUNITY)
Admission: RE | Admit: 2010-04-17 | Discharge: 2010-04-17 | Disposition: A | Discharge status: Home / Self Care | Payer: 59 | Source: Ambulatory Visit | Attending: Surgery | Admitting: Surgery

## 2010-04-17 DIAGNOSIS — Z9089 Acquired absence of other organs: Secondary | ICD-10-CM | POA: Insufficient documentation

## 2010-04-17 DIAGNOSIS — E210 Primary hyperparathyroidism: Secondary | ICD-10-CM | POA: Insufficient documentation

## 2010-04-17 DIAGNOSIS — Z90710 Acquired absence of both cervix and uterus: Secondary | ICD-10-CM | POA: Insufficient documentation

## 2010-04-17 DIAGNOSIS — Z79899 Other long term (current) drug therapy: Secondary | ICD-10-CM | POA: Insufficient documentation

## 2010-05-04 NOTE — Op Note (Signed)
NAMESTEPHONIE, Alicia Davies                 ACCOUNT NO.:  [id number]  MEDICAL RECORD NO.:  [id number]           PATIENT TYPE:  O  LOCATION:  DAYL                         FACILITY:  [facility]  PHYSICIAN:  Velora Heckler, MD      DATE OF BIRTH:  1955/03/26  DATE OF PROCEDURE:  04/17/2010                               OPERATIVE REPORT   PREOPERATIVE DIAGNOSIS:  Primary hyperparathyroidism.  POSTOPERATIVE DIAGNOSIS:  Primary hyperparathyroidism.  PROCEDURE:  Right superior minimally invasive parathyroidectomy.  SURGEON:  Velora Heckler, MD, FACS  ANESTHESIA:  General.  ESTIMATED BLOOD LOSS:  Minimal.  PREPARATION:  ChloraPrep.  COMPLICATIONS:  None.  INDICATIONS:  [name] is a 55 year old white female from [place], [place].  The patient had been evaluated by her primary care physician and noted to have hypercalcemia.  Calcium level was 10.9 and an intact PTH level was found to be 163.8.  She underwent nuclear medicine sestamibi scanning at my request.  This demonstrated increased activity in the right superior position consistent with parathyroid adenoma.  The patient now comes to surgery for parathyroidectomy.  DESCRIPTION OF PROCEDURE:  Procedure was done in OR #3 at the [facility].  The patient was brought to the operating room, placed in supine position on the operating room table.  Following administration of general anesthesia, the patient was positioned and then prepped and draped in the usual strict aseptic fashion.  After ascertaining that an adequate level of anesthesia had been achieved, a right neck incision was made a #15 blade.  Dissection was carried through subcutaneous tissues and platysma.  External jugular vein was divided between hemostats and ligated with 2-0 silk ties.  Skin flaps were elevated circumferentially.  A Weitlaner retractor was placed for exposure.  Strap muscles were incised in the midline and reflected to the right  exposing the right thyroid lobe.  Lobe was gently mobilized and venous tributaries divided between Ligaclips.  Posterior to the right thyroid lobe alongside the lateral edge of the esophagus was an abnormally enlarged parathyroid gland.  This was in the superior position.  It was gently dissected off of the esophagus and the thyroid capsule.  Vascular tributaries were divided between small Ligaclips. Gland was completely excised.  It measured approximately 2 cm in length. It was submitted to pathology fresh where frozen section biopsy by Dr. Laureen Ochs confirmed parathyroid tissue consistent with parathyroid adenoma.  Neck was irrigated with warm saline.  Good hemostasis was achieved. Surgicel was placed in the operative field.  Strap muscles were reapproximated in the midline with interrupted 3-0 Vicryl sutures. Platysma was closed with interrupted 3-0 Vicryl sutures.  Skin was anesthetized with local anesthetic.  Skin edges were reapproximated with a running 4-0 Monocryl subcuticular suture.  Wound was washed and dried and Benzoin and Steri-Strips were applied.  Sterile dressings were applied.  The patient was awakened from anesthesia and brought to the recovery room.  The patient tolerated the procedure well.   Velora Heckler, MD, FACS     TMG/MEDQ  D:  04/17/2010  T:  04/17/2010  Job:  [id number]  cc:   Sean A. Everardo All, MD [address] [place] [address]  Seymour Bars, D.O. [facility]. [address] [place], [place] [address]  Electronically Signed by Darnell Level MD on 05/04/2010 06:55:46 AM

## 2010-05-10 ENCOUNTER — Ambulatory Visit (INDEPENDENT_AMBULATORY_CARE_PROVIDER_SITE_OTHER): Payer: 59 | Admitting: Family Medicine

## 2010-05-10 DIAGNOSIS — E538 Deficiency of other specified B group vitamins: Secondary | ICD-10-CM

## 2010-05-10 MED ORDER — CYANOCOBALAMIN 1000 MCG/ML IJ SOLN
1000.0000 ug | INTRAMUSCULAR | Status: DC
  Administered 2010-05-10: 1000 ug via INTRAMUSCULAR

## 2010-06-21 ENCOUNTER — Encounter: Payer: Self-pay | Admitting: Family Medicine

## 2010-06-21 DIAGNOSIS — E210 Primary hyperparathyroidism: Secondary | ICD-10-CM

## 2010-06-26 ENCOUNTER — Ambulatory Visit: Payer: Commercial Managed Care - PPO | Admitting: Family Medicine

## 2010-08-24 ENCOUNTER — Encounter: Payer: Self-pay | Admitting: Family Medicine

## 2010-08-24 ENCOUNTER — Ambulatory Visit (INDEPENDENT_AMBULATORY_CARE_PROVIDER_SITE_OTHER): Payer: 59 | Admitting: Family Medicine

## 2010-08-24 ENCOUNTER — Ambulatory Visit
Admission: RE | Admit: 2010-08-24 | Discharge: 2010-08-24 | Disposition: A | Discharge status: Home / Self Care | Payer: Federal, State, Local not specified - PPO | Source: Ambulatory Visit | Attending: Family Medicine | Admitting: Family Medicine

## 2010-08-24 ENCOUNTER — Telehealth: Payer: Self-pay | Admitting: Family Medicine

## 2010-08-24 VITALS — BP 138/87 | HR 79 | Ht 65.0 in | Wt 261.0 lb

## 2010-08-24 DIAGNOSIS — M25569 Pain in unspecified knee: Secondary | ICD-10-CM

## 2010-08-24 DIAGNOSIS — M25561 Pain in right knee: Secondary | ICD-10-CM

## 2010-08-24 NOTE — Progress Notes (Signed)
  Subjective:    Patient ID: Alicia Davies, female    DOB: 1955/01/24, 55 y.o.   MRN: [id number]  HPI  55 yo WF presents for a fall that occurred about 6 wks.  She slid on the garage floor and hit the lateral side of her R knee.  It is not getting better.  She feels that her R calf has gotten bigger.  She has pain with full flexion and with leg abduction.  She is limping.  She has night pain sometimes.  She is taking ibuprofen which helps some.  Use iced it at the begging.  She feels like it is going to give way.  She has never had an injury or surgery to the R knee.    BP 138/87  Pulse 79  Ht 5\' 5"  (1.651 m)  Wt 261 lb (118.389 kg)  BMI 43.43 kg/m2  SpO2 98%   Review of Systems  Musculoskeletal: Positive for joint swelling and gait problem.       Objective:   Physical Exam  Constitutional: She appears well-developed and well-nourished.  Cardiovascular: Normal rate, regular rhythm and normal heart sounds.   Pulmonary/Chest: Effort normal and breath sounds normal.  Musculoskeletal: She exhibits no edema.       Full passive R knee ROM, pain with full flexion.  Lateral > medial joint line tenderness.  No effusion or Bakers cyst.  No bruising or redness.  Tender over R IT Band.  Neg McMurrray test,  No laxity with varus / valgus testing.  Neg Lachmans.  Neurological: She has normal reflexes.  Skin: Skin is warm and dry.          Assessment & Plan:

## 2010-08-24 NOTE — Telephone Encounter (Signed)
Pt aware.

## 2010-08-24 NOTE — Telephone Encounter (Signed)
Pls let pt know that her knee xray is normal.  Fax copy of results to orthopedic specialists.

## 2010-08-24 NOTE — Patient Instructions (Signed)
Xray R knee downstairs today.  Will call you w/ results this afternoon.  ACE wrap for comfort.  Use Ice 15 min on and off 4 x a day. Take Advil 3 tabs with breakfast, lunch and dinner for the next wk.  Will get you back in with [facility]

## 2010-08-24 NOTE — Assessment & Plan Note (Signed)
r knee injury 6 wks ago, failing to improve with continued limp.  Concern for internal derrangement.  At risk for DJD given her obesity.  ACE wrapped today.  Start Ice and regular use of Advil for the next wk.  Xray today and set up f/u with [facility].

## 2010-11-05 ENCOUNTER — Inpatient Hospital Stay (INDEPENDENT_AMBULATORY_CARE_PROVIDER_SITE_OTHER)
Admission: RE | Admit: 2010-11-05 | Discharge: 2010-11-05 | Disposition: A | Discharge status: Home / Self Care | Payer: Federal, State, Local not specified - PPO | Source: Ambulatory Visit | Attending: Family Medicine | Admitting: Family Medicine

## 2010-11-05 ENCOUNTER — Encounter: Payer: Self-pay | Admitting: Family Medicine

## 2010-11-05 DIAGNOSIS — R1031 Right lower quadrant pain: Secondary | ICD-10-CM

## 2010-11-05 LAB — CONVERTED CEMR LAB
Bilirubin Urine: NEGATIVE
Blood in Urine, dipstick: NEGATIVE
Nitrite: NEGATIVE
Specific Gravity, Urine: >=1.03
pH: 5.5

## 2010-11-06 ENCOUNTER — Ambulatory Visit
Admission: RE | Admit: 2010-11-06 | Discharge: 2010-11-06 | Disposition: A | Discharge status: Home / Self Care | Payer: Federal, State, Local not specified - PPO | Source: Ambulatory Visit | Attending: Family Medicine | Admitting: Family Medicine

## 2010-11-06 ENCOUNTER — Other Ambulatory Visit: Payer: Self-pay | Admitting: Family Medicine

## 2010-11-06 DIAGNOSIS — M545 Low back pain, unspecified: Secondary | ICD-10-CM

## 2010-11-06 DIAGNOSIS — R1032 Left lower quadrant pain: Secondary | ICD-10-CM

## 2010-11-06 DIAGNOSIS — R350 Frequency of micturition: Secondary | ICD-10-CM

## 2010-11-06 MED ORDER — IOHEXOL 300 MG/ML  SOLN: 125.0000 mL | Freq: Once | INTRAMUSCULAR | Status: AC | PRN

## 2010-11-07 ENCOUNTER — Encounter: Payer: Self-pay | Admitting: Family Medicine

## 2010-11-14 ENCOUNTER — Inpatient Hospital Stay (INDEPENDENT_AMBULATORY_CARE_PROVIDER_SITE_OTHER)
Admission: RE | Admit: 2010-11-14 | Discharge: 2010-11-14 | Disposition: A | Discharge status: Home / Self Care | Payer: Federal, State, Local not specified - PPO | Source: Ambulatory Visit | Attending: Family Medicine | Admitting: Family Medicine

## 2010-11-14 ENCOUNTER — Encounter: Payer: Self-pay | Admitting: Family Medicine

## 2010-11-14 DIAGNOSIS — R1031 Right lower quadrant pain: Secondary | ICD-10-CM

## 2010-11-14 DIAGNOSIS — L259 Unspecified contact dermatitis, unspecified cause: Secondary | ICD-10-CM

## 2010-11-14 LAB — CONVERTED CEMR LAB
Bilirubin Urine: NEGATIVE
Glucose, Urine, Semiquant: NEGATIVE
Ketones, urine, test strip: NEGATIVE
Specific Gravity, Urine: 1.025
Urobilinogen, UA: 0.2

## 2010-12-17 NOTE — Progress Notes (Signed)
Summary: Fever/headache/rash in last week (rm 4)   Vital Signs:  Patient Profile:   55 Years Old Female CC:      fever, chills, HA, rash x last night Height:     67.5 inches (171.45 cm) O2 Sat:      98 % O2 treatment:    Room Air Temp:     98.5 degrees F oral Pulse rate:   90 / minute Resp:     18 per minute BP sitting:   105 / 75  (right arm) Cuff size:   large  Vitals Entered By: Lajean Saver RN (November 14, 2010 10:05 AM)                  Updated Prior Medication List: * VITAMIN D 50,000 IU CAPSULES 1 capsule by mouth once a week * ESTRADIOL CREAM Use as directed * PROGESTERONE CREAM Use as directed VITAMIN D 1000 UNIT TABS (CHOLECALCIFEROL) 1 tablet by mouth once daily CYANOCOBALAMIN 1000 MCG/ML SOLN (CYANOCOBALAMIN) 1 injection IM once monthly CIPROFLOXACIN HCL 750 MG TABS (CIPROFLOXACIN HCL) One by mouth two times a day METRONIDAZOLE 500 MG TABS (METRONIDAZOLE) One by mouth q6hr LORTAB 5 5-500 MG TABS (HYDROCODONE-ACETAMINOPHEN) One by mouth q4 to 6hr as needed pain  Current Allergies (reviewed today): ! ALEVE (NAPROXEN SODIUM) ! DIFLUCAN (FLUCONAZOLE)History of Present Illness Chief Complaint: fever, chills, HA, rash x last night History of Present Illness:  Subjective:  Patient reports that her abdominal discomfort has essentially resolved after one week of Flagyl and Augmentin.  Last night she developed chills and fever to 101.  She also developed a generalized non-pruritic rash.  Today she had a mild headache, mild dizziness, and mild nausea without vomiting.  No respiratory or GU symptoms.  She notes that she has an appt with gastroenterologist on November 8.  REVIEW OF SYSTEMS Constitutional Symptoms       Complains of fever and chills.     Denies night sweats, weight loss, weight gain, and fatigue.  Eyes       Denies change in vision, eye pain, eye discharge, glasses, contact lenses, and eye surgery. Ear/Nose/Throat/Mouth       Denies hearing loss/aids,  change in hearing, ear pain, ear discharge, dizziness, frequent runny nose, frequent nose bleeds, sinus problems, sore throat, hoarseness, and tooth pain or bleeding.  Respiratory       Denies dry cough, productive cough, wheezing, shortness of breath, asthma, bronchitis, and emphysema/COPD.  Cardiovascular       Denies murmurs, chest pain, and tires easily with exhertion.    Gastrointestinal       Denies stomach pain, nausea/vomiting, diarrhea, constipation, blood in bowel movements, and indigestion. Genitourniary       Denies painful urination, kidney stones, and loss of urinary control. Neurological       Complains of headaches.      Denies paralysis, seizures, and fainting/blackouts. Musculoskeletal       Denies muscle pain, joint pain, joint stiffness, decreased range of motion, redness, swelling, muscle weakness, and gout.  Skin       Denies bruising, unusual mles/lumps or sores, and hair/skin or nail changes.      Comments: arms, legs, abdomen Psych       Denies mood changes, temper/anger issues, anxiety/stress, speech problems, depression, and sleep problems. Other Comments: Patient developed symptoms last night. Her back pain for last visit is improving. Taken benadryl and tylenol @ 0630 this AM. Still taking cipro.    Past History:  Past Medical History: Reviewed history from 11/05/2010 and no changes required. PRIMARY HYPERPARATHYROIDISM (ICD-252.01) POSTMENOPAUSAL STATUS (ICD-V49.81) B12 DEFICIENCY (ICD-266.2) HYPERCALCEMIA (ICD-275.42) OBESITY, UNSPECIFIED (ICD-278.00) UNSPECIFIED VITAMIN D DEFICIENCY (ICD-268.9) FATIGUE (ICD-780.79) kidney stones  Past Surgical History: Reviewed history from 11/05/2010 and no changes required. appendectomy 1978 arthroscopic knee surgery-1997 TAH with BSO for fibroids at age 55 cholecystectomy-2007 RTC repair (L ) 2009 Hysterectomy-2003 lithotripsy  Family History: Reviewed history from 02/12/2010 and no changes  required. mother died in her 53s, CHF, DM, CVA, HTN father died in his 34s ?  brother died in his 65s- DM 2 brothers Alive.   neg for parathyroid probs.  Social History: Reviewed history from 12/05/2009 and no changes required. 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  Has 3 daughters. Never smoked. Denies ETOH. No exercise.  Fair diet.   Objective:  No acute distress; alert and oriented  Eyes:  Pupils are equal, round, and reactive to light and accomodation.  Extraocular movement is intact.  Conjunctivae are not inflamed.  Mouth:  No lesions Pharynx:  Normal  Neck:  Supple.  No adenopathy is present.  Lungs:  Clear to auscultation.  Breath sounds are equal.  Heart:  Regular rate and rhythm without murmurs, rubs, or gallops.  Abdomen:   Vague mild tenderness in the left lower quadrant without masses or hepatosplenomegaly.  Bowel sounds are present.  No CVA or flank tenderness.  Extremities:  No edema. Skin:  Diffuse irregular macular erythema on face, trunk, and extremities. CBC:  WBC 8.4 ; LY 11.3, MO 4.6, GR 84.1; Hgb 14.4 urinalysis (dipstick):  negative except  trace blood  Assessment  Assessed ABDOMINAL PAIN, RIGHT LOWER QUADRANT as improved - Donna Christen MD New Problems: DERMATITIS (ICD-692.9)  SUSPECT ADVERSE DRUG REACTION TO CIPRO OR FLAGYL  Plan New Medications/Changes: PREDNISONE 10 MG TABS (PREDNISONE) 2 PO BID for 3 days, then 1 BID for 2 days, then 1 daily for 2 days.  Take PC  #18 x 0, 11/14/2010, Donna Christen MD  New Orders: CBC w/Diff [[id number]] Urinalysis [CPT-81003] Est. Patient Level IV [09811] Planning Comments:   Recommend discontinuing antibiotics Begin tapering course of prednisone.  May take Benadryl as needed Return for worsening symptoms.   Follow-up with GI as scheduled.   The patient and/or caregiver has been counseled thoroughly with regard to medications prescribed including dosage, schedule, interactions, rationale for use, and possible side  effects and they verbalize understanding.  Diagnoses and expected course of recovery discussed and will return if not improved as expected or if the condition worsens. Patient and/or caregiver verbalized understanding.  Prescriptions: PREDNISONE 10 MG TABS (PREDNISONE) 2 PO BID for 3 days, then 1 BID for 2 days, then 1 daily for 2 days.  Take PC  #18 x 0   Entered and Authorized by:   Donna Christen MD   Signed by:   Donna Christen MD on 11/14/2010   Method used:   Print then Give to Patient   RxID:   [id number]   Orders Added: 1)  CBC w/Diff [[id number]] 2)  Urinalysis [CPT-81003] 3)  Est. Patient Level IV [84132]    Laboratory Results   Urine Tests  Date/Time Received: November 14, 2010 11:07 AM  Date/Time Reported: November 14, 2010 11:07 AM   Routine Urinalysis   Color: yellow Appearance: Cloudy Glucose: negative   (Normal Range: Negative) Bilirubin: negative   (Normal Range: Negative) Ketone: negative   (Normal Range: Negative) Spec. Gravity: 1.025   (Normal Range: 1.003-1.035) Blood: trace-intact   (Normal  Range: Negative) pH: 5.5   (Normal Range: 5.0-8.0) Protein: negative   (Normal Range: Negative) Urobilinogen: 0.2   (Normal Range: 0-1) Nitrite: negative   (Normal Range: Negative) Leukocyte Esterace: negative   (Normal Range: Negative)

## 2010-12-17 NOTE — Progress Notes (Signed)
Summary: low back pain (rm 3)   Vital Signs:  Patient Profile:   55 Years Old Female CC:      low back pain, left lower abd pain, urinary frequency Height:     67.5 inches (171.45 cm) Weight:      263 pounds O2 Sat:      100 % O2 treatment:    Room Air Temp:     97.5 degrees F oral Pulse rate:   71 / minute Resp:     16 per minute BP sitting:   137 / 81  (left arm) Cuff size:   large  Vitals Entered By: Lajean Saver RN (November 05, 2010 2:39 PM)                  Updated Prior Medication List: * VITAMIN D 50,000 IU CAPSULES 1 capsule by mouth once a week * ESTRADIOL CREAM Use as directed * PROGESTERONE CREAM Use as directed VITAMIN D 1000 UNIT TABS (CHOLECALCIFEROL) 1 tablet by mouth once daily CYANOCOBALAMIN 1000 MCG/ML SOLN (CYANOCOBALAMIN) 1 injection IM once monthly  Current Allergies (reviewed today): ! ALEVE (NAPROXEN SODIUM) ! DIFLUCAN (FLUCONAZOLE)History of Present Illness Chief Complaint: low back pain, left lower abd pain, urinary frequency History of Present Illness:  Subjective:  Patient complains of one week history of urinary frequency without dysuria, hesitancy, or urgency.  During the past two days she has had intermittent pain in her left lower quadrant, radiating to the left back.  She has had chills for 3 to 4 days, and sweats at night.  Her bowel movements have not been quite normal during the past 3 to 4 days, and somewhat loose today.  She has been mildly constipated.  No melena or hematochezia.  No nausea/vomiting.  No respiratory symptoms. She has a history of total hysterectomy. She had a colonoscopy 02/01/10 that revealed polyps in the sigmoid colon.  Pathology diagnosis:  benign hyperplastic polyp.  REVIEW OF SYSTEMS Constitutional Symptoms      Denies fever, chills, night sweats, weight loss, weight gain, and fatigue.  Eyes       Denies change in vision, eye pain, eye discharge, glasses, contact lenses, and eye  surgery. Ear/Nose/Throat/Mouth       Denies hearing loss/aids, change in hearing, ear pain, ear discharge, dizziness, frequent runny nose, frequent nose bleeds, sinus problems, sore throat, hoarseness, and tooth pain or bleeding.  Respiratory       Denies dry cough, productive cough, wheezing, shortness of breath, asthma, bronchitis, and emphysema/COPD.  Cardiovascular       Denies murmurs, chest pain, and tires easily with exhertion.    Gastrointestinal       Denies stomach pain, nausea/vomiting, diarrhea, constipation, blood in bowel movements, and indigestion. Genitourniary       Denies painful urination, kidney stones, and loss of urinary control.      Comments: urinary frequency Neurological       Denies paralysis, seizures, and fainting/blackouts. Musculoskeletal       Denies muscle pain, joint pain, joint stiffness, decreased range of motion, redness, swelling, muscle weakness, and gout.  Skin       Denies bruising, unusual mles/lumps or sores, and hair/skin or nail changes.  Psych       Denies mood changes, temper/anger issues, anxiety/stress, speech problems, depression, and sleep problems. Other Comments: Patient c/o urinary frequency x 6 days and lower left abdominal pain and lower back pain x 2 days.    Past History:  Past Medical  History: PRIMARY HYPERPARATHYROIDISM (ICD-252.01) POSTMENOPAUSAL STATUS (ICD-V49.81) B12 DEFICIENCY (ICD-266.2) HYPERCALCEMIA (ICD-275.42) OBESITY, UNSPECIFIED (ICD-278.00) UNSPECIFIED VITAMIN D DEFICIENCY (ICD-268.9) FATIGUE (ICD-780.79) kidney stones  Past Surgical History: appendectomy 1978 arthroscopic knee surgery-1997 TAH with BSO for fibroids at age 87 cholecystectomy-2007 RTC repair (L ) 2009 Hysterectomy-2003 lithotripsy  Family History: Reviewed history from 02/12/2010 and no changes required. mother died in her 77s, CHF, DM, CVA, HTN father died in his 4s ?  brother died in his 61s- DM 2 brothers Alive.   neg for  parathyroid probs.  Social History: Reviewed history from 12/05/2009 and no changes required. SAHM. Married to [place].  Has 3 daughters. Never smoked. Denies ETOH. No exercise.  Fair diet.   Objective:  No acute distress;alert and oriented  Eyes:  Pupils are equal, round, and reactive to light and accomodation.  Extraocular movement is intact.  Conjunctivae are not inflamed.  Mouth/pharynx:  moist mucous membranes  Neck:  Supple.  No adenopathy is present.   Lungs:  Clear to auscultation.  Breath sounds are equal.  Heart:  Regular rate and rhythm without murmurs, rubs, or gallops.  Abdomen: Tenderness in the left lower qudrant without masses or hepatosplenomegaly.  Bowel sounds are present.  No CVA or flank tenderness.  Back:  Nontender Extremities:  No edema.   Skin:  No rash urinalysis (dipstick): negative CBC:  WBC 10.6 ; LY 23.6, MO 4.1, GR 72.3; Hgb 14.1  Assessment New Problems: ABDOMINAL PAIN, RIGHT LOWER QUADRANT (ICD-789.03)  ? DIVERTICULITIS  Plan New Medications/Changes: LORTAB 5 5-500 MG TABS (HYDROCODONE-ACETAMINOPHEN) One by mouth q4 to 6hr as needed pain  #15 (fifteen) x 0, 11/05/2010, Donna Christen MD METRONIDAZOLE 500 MG TABS (METRONIDAZOLE) One by mouth q6hr  #28 x 0, 11/05/2010, Donna Christen MD CIPROFLOXACIN HCL 750 MG TABS (CIPROFLOXACIN HCL) One by mouth two times a day  #14 x 0, 11/05/2010, Donna Christen MD  New Orders: Urinalysis [81003-65000] T-Culture, Urine [[id number]] T-CT Abdomen/pelvis w [74177] CBC w/Diff [[id number]] New Patient Level V [99205] Planning Comments:   Chart reviewed.   Will empirically begin Cipro and Flagyl to cover possible diverticulitis.  Lortab for pain.  Begin clear liquid diet until tomorrow, then slowly advance. Schedule CT scan abdomen/pelvis with contrast.  Although urinalysis (dipstick) negative, will send urine culture. Follow-up with PCP If symptoms become significantly worse during the night,  proceed to  the local emergency room.   The patient and/or caregiver has been counseled thoroughly with regard to medications prescribed including dosage, schedule, interactions, rationale for use, and possible side effects and they verbalize understanding.  Diagnoses and expected course of recovery discussed and will return if not improved as expected or if the condition worsens. Patient and/or caregiver verbalized understanding.  Prescriptions: LORTAB 5 5-500 MG TABS (HYDROCODONE-ACETAMINOPHEN) One by mouth q4 to 6hr as needed pain  #15 (fifteen) x 0   Entered and Authorized by:   Donna Christen MD   Signed by:   Donna Christen MD on 11/05/2010   Method used:   Print then Give to Patient   RxID:   [id number] METRONIDAZOLE 500 MG TABS (METRONIDAZOLE) One by mouth q6hr  #28 x 0   Entered and Authorized by:   Donna Christen MD   Signed by:   Donna Christen MD on 11/05/2010   Method used:   Print then Give to Patient   RxID:   [id number] CIPROFLOXACIN HCL 750 MG TABS (CIPROFLOXACIN HCL) One by mouth two times a day  #14 x 0   Entered  and Authorized by:   Donna Christen MD   Signed by:   Donna Christen MD on 11/05/2010   Method used:   Print then Give to Patient   RxID:   [id number]   Orders Added: 1)  Urinalysis [81003-65000] 2)  T-Culture, Urine [[id number]] 3)  T-CT Abdomen/pelvis w [74177] 4)  CBC w/Diff [[id number]] 5)  New Patient Level V [99205]    Laboratory Results   Urine Tests  Date/Time Received: November 05, 2010 2:42 PM  Date/Time Reported: November 05, 2010 2:42 PM   Routine Urinalysis   Color: yellow Appearance: Clear Glucose: negative   (Normal Range: Negative) Bilirubin: negative   (Normal Range: Negative) Ketone: negative   (Normal Range: Negative) Spec. Gravity: >=1.030   (Normal Range: 1.003-1.035) Blood: negative   (Normal Range: Negative) pH: 5.5   (Normal Range: 5.0-8.0) Protein: negative   (Normal Range: Negative) Urobilinogen: 0.2   (Normal  Range: 0-1) Nitrite: negative   (Normal Range: Negative) Leukocyte Esterace: negative   (Normal Range: Negative)       Appended Document: low back pain (rm 3) Diagnosis Correction:  Should be:  "Abdominal Pain, Left Lower Quadrant"

## 2010-12-17 NOTE — Progress Notes (Signed)
Summary: Followup Call  Followup call to patient:  she feels better today; less abdominal discomfort. Discussed results of CT abdomen/pelvis:  ? epiploic appendagitis. Recommend that she follow-up with her gastroenterologist. Donna Christen MD  November 07, 2010 10:41 AM

## 2011-04-18 ENCOUNTER — Encounter: Payer: Self-pay | Admitting: *Deleted

## 2011-04-18 ENCOUNTER — Emergency Department
Admission: EM | Admit: 2011-04-18 | Discharge: 2011-04-18 | Disposition: A | Discharge status: Home / Self Care | Payer: Self-pay | Source: Home / Self Care | Attending: Emergency Medicine | Admitting: Emergency Medicine

## 2011-04-18 DIAGNOSIS — J069 Acute upper respiratory infection, unspecified: Secondary | ICD-10-CM

## 2011-04-18 DIAGNOSIS — R059 Cough, unspecified: Secondary | ICD-10-CM

## 2011-04-18 DIAGNOSIS — R05 Cough: Secondary | ICD-10-CM

## 2011-04-18 LAB — POCT RAPID STREP A (OFFICE): Rapid Strep A Screen: NEGATIVE

## 2011-04-18 MED ORDER — GUAIFENESIN-CODEINE 100-10 MG/5ML PO SYRP: 5.0000 mL | ORAL_SOLUTION | Freq: Four times a day (QID) | ORAL | Status: AC | PRN

## 2011-04-18 MED ORDER — AMOXICILLIN 875 MG PO TABS: 875.0000 mg | ORAL_TABLET | Freq: Two times a day (BID) | ORAL | Status: AC

## 2011-04-18 MED ORDER — AZITHROMYCIN 250 MG PO TABS: ORAL_TABLET | ORAL | Status: AC

## 2011-04-18 NOTE — ED Notes (Signed)
Pt c/o productive cough, runny nose, and HA x 3 wks. She has taken Mucinex and Motrin.

## 2011-04-18 NOTE — ED Provider Notes (Signed)
History     CSN: [id number]  Arrival date & time 04/18/11  1038   None     Chief Complaint  Patient presents with  . Cough    (Consider location/radiation/quality/duration/timing/severity/associated sxs/prior treatment) HPI Peta is a 56 y.o. female who complains of onset of cold symptoms for 2 weeks, improving then worsening last few days.  The symptoms are constant and mild-moderate in severity. No sore throat + cough No pleuritic pain No wheezing + nasal congestion + post-nasal drainage + sinus pain/pressure No chest congestion No itchy/red eyes No earache No hemoptysis No SOB + chills/sweats No fever + nausea/ vomiting (resolved last week) No abdominal pain No diarrhea No skin rashes + fatigue No myalgias + headache    Past Medical History  Diagnosis Date  . Hyperparathyroidism     Past Surgical History  Procedure Date  . Parathyroidectomy 04/2010    done by Dr Gerrit Friends    No family history on file.  History  Substance Use Topics  . Smoking status: Never Smoker   . Smokeless tobacco: Not on file  . Alcohol Use: Not on file    OB History    Grav Para Term Preterm Abortions TAB SAB Ect Mult Living                  Review of Systems  All other systems reviewed and are negative.    Allergies  Fluconazole and Naproxen sodium  Home Medications  No current outpatient prescriptions on file.  There were no vitals taken for this visit.  Physical Exam  Nursing note and vitals reviewed. Constitutional: She is oriented to person, place, and time. She appears well-developed and well-nourished.  HENT:  Head: Normocephalic and atraumatic.  Right Ear: Tympanic membrane, external ear and ear canal normal.  Left Ear: Tympanic membrane, external ear and ear canal normal.  Nose: Mucosal edema and rhinorrhea present.  Mouth/Throat: Posterior oropharyngeal erythema present. No oropharyngeal exudate or posterior oropharyngeal edema.  Eyes: No scleral  icterus.  Neck: Neck supple.  Cardiovascular: Regular rhythm and normal heart sounds.   Pulmonary/Chest: Effort normal and breath sounds normal. No respiratory distress.  Neurological: She is alert and oriented to person, place, and time.  Skin: Skin is warm and dry.  Psychiatric: She has a normal mood and affect. Her speech is normal.    ED Course  Procedures (including critical care time)  Labs Reviewed - No data to display No results found.   1. Acute upper respiratory infections of unspecified site   2. Cough       MDM  1)  Take the prescribed antibiotic as instructed.  Rapid strep negative. 2)  Use nasal saline solution (over the counter) at least 3 times a day. 3)  Use over the counter decongestants like Zyrtec-D every 12 hours as needed to help with congestion.  If you have hypertension, do not take medicines with sudafed.  4)  Can take tylenol every 6 hours or motrin every 8 hours for pain or fever. 5)  Follow up with your primary doctor if no improvement in 5-7 days, sooner if increasing pain, fever, or new symptoms.     Originally give her a Z-Pak, however she is concerned with the current warnings on it so we switched her to amoxicillin.  Marlaine Hind, MD 04/18/11 1122

## 2011-05-17 ENCOUNTER — Ambulatory Visit (INDEPENDENT_AMBULATORY_CARE_PROVIDER_SITE_OTHER): Payer: BC Managed Care – HMO | Admitting: Physician Assistant

## 2011-05-17 ENCOUNTER — Encounter: Payer: Self-pay | Admitting: Physician Assistant

## 2011-05-17 VITALS — BP 128/68 | HR 67 | Temp 97.7°F | Ht 69.0 in | Wt 259.0 lb

## 2011-05-17 DIAGNOSIS — F411 Generalized anxiety disorder: Secondary | ICD-10-CM

## 2011-05-17 DIAGNOSIS — F419 Anxiety disorder, unspecified: Secondary | ICD-10-CM

## 2011-05-17 MED ORDER — SERTRALINE HCL 50 MG PO TABS: 50.0000 mg | ORAL_TABLET | Freq: Every day | ORAL | Status: DC

## 2011-05-17 NOTE — Patient Instructions (Signed)
Will recheck vitamin D in one to 2 months since she just recently started back on vitamin D therapy. In 2 months she can call office and we can send lab ordered down to the lab and you can get labs drawn directly. Start on Zoloft 50mg   one half tab daily for 7 days then increase to 1 tab daily. Consider thinking positive and realistic thoughts when your mom begins to wonder and think negatively. Try to focus on others and giving of your time, instead of the death of her husband and worrying about your kids health. Recheck in 6 weeks. If he experience any worsening of depression please call office immediately and stop Zoloft.

## 2011-05-17 NOTE — Progress Notes (Signed)
  Subjective:    Patient ID: Alicia Davies, female    DOB: 1955-02-03, 56 y.o.   MRN: [id number]  HPI Patient presents to the clinic with worsening anxiety since the death of her husband.She denies depression but constantly worries about dying herself and leaving her 3 girls. She lays in bed at night thinking of all the bad things that might happen to her or her girls. She denies any panic attacks. She has never taken anything for depression or anxiety.    Review of Systems     Objective:   Physical Exam  Constitutional: She is oriented to person, place, and time. She appears well-developed and well-nourished.       Obese.  HENT:  Head: Normocephalic and atraumatic.  Cardiovascular: Normal rate, regular rhythm and normal heart sounds.   Pulmonary/Chest: Effort normal and breath sounds normal.  Neurological: She is alert and oriented to person, place, and time.  Skin: Skin is warm and dry.  Psychiatric:       Very emotional in the room and while talking.          Assessment & Plan:  Anxiety-GAD-7 was 9 and PHQ-9 was 3. Will recheck vitamin D in one to 2 months since she just recently started back on vitamin D therapy. In 2 months she can call office and we can send lab ordered down to the lab and you can get labs drawn directly. Start on Zoloft 50mg   one half tab daily for 7 days then increase to 1 tab daily. Consider thinking positive and realistic thoughts when your mom begins to wonder and think negatively. Try to focus on others and giving of your time, instead of the death of her husband and worrying about your kids health. Recheck in 6 weeks. If he experience any worsening of depression please call office immediately and stop Zoloft.

## 2011-06-14 ENCOUNTER — Other Ambulatory Visit: Payer: Self-pay | Admitting: Physician Assistant

## 2011-06-18 ENCOUNTER — Other Ambulatory Visit: Payer: Self-pay | Admitting: *Deleted

## 2011-06-18 MED ORDER — SERTRALINE HCL 50 MG PO TABS: 50.0000 mg | ORAL_TABLET | Freq: Every day | ORAL | Status: DC

## 2011-06-21 ENCOUNTER — Encounter: Payer: Self-pay | Admitting: Physician Assistant

## 2011-06-21 ENCOUNTER — Ambulatory Visit (INDEPENDENT_AMBULATORY_CARE_PROVIDER_SITE_OTHER): Payer: BC Managed Care – HMO | Admitting: Physician Assistant

## 2011-06-21 VITALS — BP 137/74 | HR 76 | Ht 69.0 in | Wt 265.0 lb

## 2011-06-21 DIAGNOSIS — Z79899 Other long term (current) drug therapy: Secondary | ICD-10-CM

## 2011-06-21 DIAGNOSIS — M25569 Pain in unspecified knee: Secondary | ICD-10-CM

## 2011-06-21 DIAGNOSIS — F419 Anxiety disorder, unspecified: Secondary | ICD-10-CM

## 2011-06-21 DIAGNOSIS — Z131 Encounter for screening for diabetes mellitus: Secondary | ICD-10-CM

## 2011-06-21 DIAGNOSIS — F411 Generalized anxiety disorder: Secondary | ICD-10-CM

## 2011-06-21 DIAGNOSIS — F3289 Other specified depressive episodes: Secondary | ICD-10-CM

## 2011-06-21 DIAGNOSIS — R635 Abnormal weight gain: Secondary | ICD-10-CM

## 2011-06-21 DIAGNOSIS — F329 Major depressive disorder, single episode, unspecified: Secondary | ICD-10-CM

## 2011-06-21 DIAGNOSIS — M25562 Pain in left knee: Secondary | ICD-10-CM

## 2011-06-21 DIAGNOSIS — Z13220 Encounter for screening for lipoid disorders: Secondary | ICD-10-CM

## 2011-06-21 DIAGNOSIS — F32A Depression, unspecified: Secondary | ICD-10-CM

## 2011-06-21 MED ORDER — HYDROCHLOROTHIAZIDE 12.5 MG PO CAPS: 12.5000 mg | ORAL_CAPSULE | Freq: Every day | ORAL | Status: DC

## 2011-06-21 MED ORDER — SERTRALINE HCL 50 MG PO TABS: 50.0000 mg | ORAL_TABLET | Freq: Every day | ORAL | Status: DC

## 2011-06-21 NOTE — Patient Instructions (Addendum)
Rest, ICE, Compression, Elevation. Motrin for pain and swelling can take up to 800mg  three times a day. Tylenol 1000mg  up to three times a day. Will call with MRI results. Consider a knee brace at drug store for stability. If not heard from [place] in 1 weeks definitely give [place] a call to check on it.   Refilled zoloft for 6 months. Will recheck then. Get lipid and CMP at your convince.   Meniscus Tear with Phase I Rehab The meniscus is a C-shaped cartilage structure, located in the knee joint between the thigh bone (femur) and the shinbone (tibia). Two menisci are located in each knee joint: the inner and outer meniscus. The meniscus acts as an adapter between the thigh bone and shinbone, allowing them to fit properly together. It also functions as a shock absorber, to reduce the stress placed on the knee joint and to help supply nutrients to the knee joint cartilage. As people age, the meniscus begins to harden and become more vulnerable to injury. Meniscus tears are a common injury, especially in older athletes. Inner meniscus tears are more common than outer meniscus tears.  SYMPTOMS   Pain in the knee, especially with standing or squatting with the affected leg.   Tenderness along the joint line.   Swelling in the knee joint (effusion), usually starting 1 to 2 days after injury.   Locking or catching of the knee joint, causing inability to straighten the knee completely.   Giving way or buckling of the knee.  CAUSES  A meniscus tear occurs when a force is placed on the meniscus that is greater than it can handle. Common causes of injury include:  Direct hit (trauma) to the knee.   Twisting, pivoting, or cutting (rapidly changing direction while running), kneeling or squatting.   Without injury, due to aging.  RISK INCREASES WITH:  Contact sports (football, rugby).   Sports in which cleats are used with pivoting (soccer, lacrosse) or sports in which good shoe grip and sudden change in  direction are required (racquetball, basketball, squash).   Previous knee injury.   Associated knee injury, particularly ligament injuries.   Poor strength and flexibility.  PREVENTION  Warm up and stretch properly before activity.   Maintain physical fitness:   Strength, flexibility, and endurance.   Cardiovascular fitness.   Protect the knee with a brace or elastic bandage.   Wear properly fitted protective equipment (proper cleats for the surface).  PROGNOSIS  Sometimes, meniscus tears heal on their own. However, definitive treatment requires surgery, followed by at least 6 weeks of recovery.  RELATED COMPLICATIONS   Recurring symptoms that result in a chronic problem.   Repeated knee injury, especially if sports are resumed too soon after injury or surgery.   Progression of the tear (the tear gets larger), if untreated.   Arthritis of the knee in later years (with or without surgery).   Complications of surgery, including infection, bleeding, injury to nerves (numbness, weakness, paralysis) continued pain, giving way, locking, nonhealing of meniscus (if repaired), need for further surgery, and knee stiffness (loss of motion).  TREATMENT  Treatment first involves the use of ice and medicine, to reduce pain and inflammation. You may find using crutches to walk more comfortable. However, it is okay to bear weight on the injured knee, if the pain will allow it. Surgery is often advised as a definitive treatment. Surgery is performed through an incision near the joint (arthroscopically). The torn piece of the meniscus is removed,  and if possible the joint cartilage is repaired. After surgery, the joint must be restrained. After restraint, it is important to perform strengthening and stretching exercises to help regain strength and a full range of motion. These exercises may be completed at home or with a therapist.  MEDICATION  If pain medicine is needed, nonsteroidal  anti-inflammatory medicines (aspirin and ibuprofen), or other minor pain relievers (acetaminophen), are often advised.   Do not take pain medicine for 7 days before surgery.   Prescription pain relievers may be given, if your caregiver thinks they are needed. Use only as directed and only as much as you need.  HEAT AND COLD  Cold treatment (icing) should be applied for 10 to 15 minutes every 2 to 3 hours for inflammation and pain, and immediately after activity that aggravates your symptoms. Use ice packs or an ice massage.   Heat treatment may be used before performing stretching and strengthening activities prescribed by your caregiver, physical therapist, or athletic trainer. Use a heat pack or a warm water soak.  SEEK MEDICAL CARE IF:   Symptoms get worse or do not improve in 2 weeks, despite treatment.   New, unexplained symptoms develop. (Drugs used in treatment may produce side effects.)  EXERCISES RANGE OF MOTION (ROM) AND STRETCHING EXERCISES - Meniscus Tear, Non-operative, Phase I These are some of the initial exercises with which you may start your rehabilitation program, until you see your caregiver again or until your symptoms are resolved. Remember:   These initial exercises are intended to be gentle. They will help you restore motion without increasing any swelling.   Completing these exercises allows less painful movement and prepares you for the more aggressive strengthening exercises in Phase II.   An effective stretch should be held for at least 30 seconds.   A stretch should never be painful. You should only feel a gentle lengthening or release in the stretched tissue.  RANGE OF MOTION - Knee Flexion, Active  Lie on your back with both knees straight. (If this causes back discomfort, bend your healthy knee, placing your foot flat on the floor.)   Slowly slide your heel back toward your buttocks until you feel a gentle stretch in the front of your knee or thigh.    Hold for __________ seconds. Slowly slide your heel back to the starting position.  Repeat __________ times. Complete this exercise __________ times per day.  RANGE OF MOTION - Knee Flexion and Extension, Active-Assisted  Sit on the edge of a table or chair with your thighs firmly supported. It may be helpful to place a folded towel under the end of your right / left thigh.   Flexion (bending): Place the ankle of your healthy leg on top of the other ankle. Use your healthy leg to gently bend your right / left knee until you feel a mild tension across the top of your knee.   Hold for __________ seconds.   Extension (straightening): Switch your ankles so your right / left leg is on top. Use your healthy leg to straighten your right / left knee until you feel a mild tension on the backside of your knee.   Hold for __________ seconds.  Repeat __________ times. Complete __________ times per day. STRETCH - Knee Flexion, Supine  Lie on the floor with your right / left heel and foot lightly touching the wall. (Place both feet on the wall if you do not use a door frame.)   Without using any  effort, allow gravity to slide your foot down the wall slowly until you feel a gentle stretch in the front of your right / left knee.   Hold this stretch for __________ seconds. Then return the leg to the starting position, using your healthy leg for help, if needed.  Repeat __________ times. Complete this stretch __________ times per day.  STRETCH - Knee Extension Sitting  Sit with your right / left leg/heel propped on another chair, coffee table, or foot stool.   Allow your leg muscles to relax, letting gravity straighten out your knee.*   You should feel a stretch behind your right / left knee. Hold this position for __________ seconds.  Repeat __________ times. Complete this stretch __________ times per day.  *Your physician, physical therapist or athletic trainer may instruct you place a __________  weight on your thigh, just above your kneecap, to deepen the stretch.  STRENGTHENING EXERCISES - Meniscus Tear, Non-operative, Phase I These exercises may help you when beginning to rehabilitate your injury. They may resolve your symptoms with or without further involvement from your physician, physical therapist or athletic trainer. While completing these exercises, remember:   Muscles can gain both the endurance and the strength needed for everyday activities through controlled exercises.   Complete these exercises as instructed by your physician, physical therapist or athletic trainer. Progress the resistance and repetitions only as guided.  STRENGTH - Quadriceps, Isometrics  Lie on your back with your right / left leg extended and your opposite knee bent.   Gradually tense the muscles in the front of your right / left thigh. You should see either your knee cap slide up toward your hip or increased dimpling just above the knee. This motion will push the back of the knee down toward the floor, mat, or bed on which you are lying.   Hold the muscle as tight as you can, without increasing your pain, for __________ seconds.   Relax the muscles slowly and completely between each repetition.  Repeat __________ times. Complete this exercise __________ times per day.  STRENGTH - Quadriceps, Short Arcs   Lie on your back. Place a __________ inch towel roll under your right / left knee, so that the knee bends slightly.   Raise only your lower leg by tightening the muscles in the front of your thigh. Do not allow your thigh to rise.   Hold this position for __________ seconds.  Repeat __________ times. Complete this exercise __________ times per day.  OPTIONAL ANKLE WEIGHTS: Begin with ____________________, but DO NOT exceed ____________________. Increase in 1 pound/0.5 kilogram increments. STRENGTH - Quadriceps, Straight Leg Raises  Quality counts! Watch for signs that the quadriceps muscle is  working, to be sure you are strengthening the correct muscles and not "cheating" by substituting with healthier muscles.  Lay on your back with your right / left leg extended and your opposite knee bent.   Tense the muscles in the front of your right / left thigh. You should see either your knee cap slide up or increased dimpling just above the knee. Your thigh may even shake a bit.   Tighten these muscles even more and raise your leg 4 to 6 inches off the floor. Hold for __________ seconds.   Keeping these muscles tense, lower your leg.   Relax the muscles slowly and completely in between each repetition.  Repeat __________ times. Complete this exercise __________ times per day.  STRENGTH - Hamstring, Curls   Lay on your  stomach with your legs extended. (If you lay on a bed, your feet may hang over the edge.)   Tighten the muscles in the back of your thigh to bend your right / left knee up to 90 degrees. Keep your hips flat on the bed.   Hold this position for __________ seconds.   Slowly lower your leg back to the starting position.  Repeat __________ times. Complete this exercise __________ times per day.  STRENGTH - Quadriceps, Squats  Stand in a door frame so that your feet and knees are in line with the frame.   Use your hands for balance, not support, on the frame.   Slowly lower your weight, bending at the hips and knees. Keep your lower legs upright so that they are parallel with the door frame. Squat only within the range that does not increase your knee pain. Never let your hips drop below your knees.   Slowly return upright, pushing with your legs, not pulling with your hands.  Repeat __________ times. Complete this exercise __________ times per day.  STRENGTH - Quad/VMO, Isometric   Sit in a chair with your right / left knee slightly bent. With your fingertips, feel the VMO muscle just above the inside of your knee. The VMO is important in controlling the position of  your kneecap.   Keeping your fingertips on this muscle. Without actually moving your leg, attempt to drive your knee down as if straightening your leg. You should feel your VMO tense. If you have a difficult time, you may wish to try the same exercise on your healthy knee first.   Tense this muscle as hard as you can without increasing any knee pain.   Hold for __________ seconds. Relax the muscles slowly and completely in between each repetition.  Repeat __________ times. Complete exercise __________ times per day.  Document Released: 01/14/1998 Document Revised: 12/20/2010 Document Reviewed: 04/14/2008 [facility] Patient Information 2012 [place], [place].

## 2011-06-21 NOTE — Progress Notes (Signed)
  Subjective:    Patient ID: Toleen Lachapelle, female    DOB: 15-Apr-1955, 56 y.o.   MRN: [id number]  HPI Patient presents to clinic for left medial and lateral knee pain for 2 weeks and to follow up on depression.  Knee pain started 2 weeks ago after going up stairs and falling/twisting on left lateral side of knee. It has been hurtful since. She has tried icing some and taking occasional ibuprofen. She thought it would get better on it's on. Left knee continues to swell and it is painful to bear weight on left side. She feels like her knee "catches" while she walks or tries to have full ROM. She has had meniscal tears on right knee before due to injury. Reports pain to be 7/10 while walking or trying to move it; otherwise, it is not very painful at rest.   Patient has experienced a lot of benefit from Zoloft. She is able to handle her stress much better. Her depression is occasional but she has had a lot going on trying to raise 3 daughters on her on without her husband. Her anxiety has decreased all together. She doesn't obsessively worry any more. She did have some GI symptoms the first week but they resolved and now doing great. Continues to gain weight and have problems getting it off. Recent injury has made it difficult to work out at all.    Does need CPE and labs to further evaluate health.   Review of Systems     Objective:   Physical Exam  Constitutional: She is oriented to person, place, and time. She appears well-developed and well-nourished.  HENT:  Head: Normocephalic and atraumatic.  Cardiovascular: Normal rate, regular rhythm and normal heart sounds.   Pulmonary/Chest: Effort normal and breath sounds normal. She has no wheezes.  Musculoskeletal:       Full ROM of left knee but when trying to flex at knee patient had to passively assist because it caught mid flexion. Pain with McMurray's on left knee both lateral and medial but no click or pop heard.   Swelling noted all around  left knee. + joint tenderness with palpation left knee both lateral and medial.   Patient does walk with a slight limp not wanting to bear weight on left leg. Pain also elicited when asked to do twisting motion with feet planted.   Neurological: She is alert and oriented to person, place, and time.  Skin: Skin is warm and dry.  Psychiatric: She has a normal mood and affect. Her behavior is normal.          Assessment & Plan:  Left lateral and medial knee pain-Suspect meniscal tear from history and exam. Will get MRI of left knee. Rest, ICE, Compression, Elevation. Motrin for pain and swelling can take up to 800mg  three times a day. Tylenol 1000mg  up to three times a day. Will call with MRI results. Consider a knee brace at drug store for stability. If not heard from [place] in 1 weeks definitely give [place] a call to check on it. Gave handout on exercises to do to help knee rehab.   Depression/Anxiety-GAD-7 was 0 and PHQ-9 was 2. Patient doing great.Refilled zoloft for 6 months. Will recheck then.   Get lipid and CMP at your convince as well as schedule a CPE.

## 2011-07-03 ENCOUNTER — Telehealth: Payer: Self-pay | Admitting: *Deleted

## 2011-07-03 DIAGNOSIS — M25569 Pain in unspecified knee: Secondary | ICD-10-CM

## 2011-07-03 NOTE — Telephone Encounter (Signed)
They have called stating that they have been asked by [employer] to review in regards to an MRI LE. States they don't show there has been improvement seen by 4 weeks of PT and meds. They are requesting a peer to peer review and would like for you to call by 4pm Central time.

## 2011-07-04 NOTE — Telephone Encounter (Signed)
Referral placed.

## 2011-07-04 NOTE — Telephone Encounter (Signed)
Spoke with pt and she is in agreement to do physical therapy for 4 weeks and will continue taking the meds for the knee pain. KG LPN

## 2011-07-04 NOTE — Telephone Encounter (Signed)
I did receive a note from her insurance company. They will not cover the MRI unless she's had 4 weeks of physical therapy and taken medication. Currently she is using the naproxen or ibuprofen. After 4 weeks of PT she can make follow appt.. If she's making significant progress and will hold off on the MRI that if she is not improving then we will be able to move forward with an MRI. If she is okay with the physical therapy let me know and I can certainly put in an order.

## 2011-07-08 ENCOUNTER — Ambulatory Visit: Payer: BC Managed Care – HMO | Attending: Family Medicine | Admitting: Physical Therapy

## 2011-07-08 ENCOUNTER — Other Ambulatory Visit: Payer: BC Managed Care – HMO

## 2011-07-08 DIAGNOSIS — M25569 Pain in unspecified knee: Secondary | ICD-10-CM | POA: Insufficient documentation

## 2011-07-08 DIAGNOSIS — R262 Difficulty in walking, not elsewhere classified: Secondary | ICD-10-CM | POA: Insufficient documentation

## 2011-07-08 DIAGNOSIS — IMO0001 Reserved for inherently not codable concepts without codable children: Secondary | ICD-10-CM | POA: Insufficient documentation

## 2011-07-08 DIAGNOSIS — M25669 Stiffness of unspecified knee, not elsewhere classified: Secondary | ICD-10-CM | POA: Insufficient documentation

## 2011-07-10 ENCOUNTER — Ambulatory Visit: Payer: BC Managed Care – HMO | Admitting: Physical Therapy

## 2011-07-12 ENCOUNTER — Ambulatory Visit: Payer: BC Managed Care – HMO | Admitting: Physical Therapy

## 2011-07-15 ENCOUNTER — Ambulatory Visit: Payer: BC Managed Care – HMO | Attending: Family Medicine | Admitting: Physical Therapy

## 2011-07-15 DIAGNOSIS — M25669 Stiffness of unspecified knee, not elsewhere classified: Secondary | ICD-10-CM | POA: Insufficient documentation

## 2011-07-15 DIAGNOSIS — M25569 Pain in unspecified knee: Secondary | ICD-10-CM | POA: Insufficient documentation

## 2011-07-15 DIAGNOSIS — IMO0001 Reserved for inherently not codable concepts without codable children: Secondary | ICD-10-CM | POA: Insufficient documentation

## 2011-07-15 DIAGNOSIS — R262 Difficulty in walking, not elsewhere classified: Secondary | ICD-10-CM | POA: Insufficient documentation

## 2011-07-17 ENCOUNTER — Ambulatory Visit: Payer: BC Managed Care – HMO | Admitting: Physical Therapy

## 2011-07-19 ENCOUNTER — Ambulatory Visit: Payer: BC Managed Care – HMO | Admitting: Physical Therapy

## 2011-07-22 ENCOUNTER — Ambulatory Visit (INDEPENDENT_AMBULATORY_CARE_PROVIDER_SITE_OTHER): Payer: BC Managed Care – HMO | Admitting: Physician Assistant

## 2011-07-22 ENCOUNTER — Encounter: Payer: Self-pay | Admitting: Physician Assistant

## 2011-07-22 ENCOUNTER — Ambulatory Visit: Payer: BC Managed Care – HMO | Admitting: Physical Therapy

## 2011-07-22 VITALS — BP 138/77 | HR 79 | Ht 69.0 in | Wt 264.0 lb

## 2011-07-22 DIAGNOSIS — M25569 Pain in unspecified knee: Secondary | ICD-10-CM

## 2011-07-22 DIAGNOSIS — M25562 Pain in left knee: Secondary | ICD-10-CM

## 2011-07-22 MED ORDER — SERTRALINE HCL 50 MG PO TABS: 50.0000 mg | ORAL_TABLET | Freq: Every day | ORAL | Status: DC

## 2011-07-22 MED ORDER — TRAMADOL HCL 50 MG PO TABS: 50.0000 mg | ORAL_TABLET | Freq: Three times a day (TID) | ORAL | Status: AC | PRN

## 2011-07-22 NOTE — Patient Instructions (Addendum)
Will refer to orthopedic for evaluation.  Ibuprofen 800mg  three times a day.   [facility] or [facility] and see if they have bigger braces for knee.

## 2011-07-22 NOTE — Progress Notes (Signed)
  Subjective:    Patient ID: Alicia Davies, female    DOB: [birth year], 56 y.o.   MRN: [id number]  HPI Pt presents to talk about next step in getting knee evaluated. She has completed 3 weeks of PT b/c ins will not pay unless PT was completed. She was supposed to complete 4 weeks. Patient was discharged from PT this week b/c there has been no improvement in function and pain of leg. It has actually gotten worse. Pt needs help to get up. The pain is 6/10 when moving leg at all. Hard to bear weight not left leg. Her leg has given way multiple times a day and she often feels like she is going to fall again.    Review of Systems     Objective:   Physical Exam  Constitutional: She appears well-developed and well-nourished.       Obese.  HENT:  Head: Normocephalic and atraumatic.  Musculoskeletal:       Left lateral knee unchanged. Walks with limp not able to bear weight on left knee. Joint tenderness more laterally than medially. ROM limited by size of leg. Able to fully extend but not flex. Lateral knee pain with McMurrays. Not able to do any twisting motion on leg.           Assessment & Plan:  Left lateral knee pain- Will refer to [facility] specialist. Dr. Providence Lanius. Offered pain medication for prn usage. Pt declined and will stick with Ibuprofen 800mg  TID. Pt could not find a brace to fit her leg at drug store. Encouraged pt to call [facility].

## 2011-07-24 ENCOUNTER — Encounter: Payer: BC Managed Care – HMO | Admitting: Physical Therapy

## 2011-07-26 ENCOUNTER — Encounter: Payer: BC Managed Care – HMO | Admitting: Physical Therapy

## 2011-09-18 ENCOUNTER — Other Ambulatory Visit: Payer: Self-pay | Admitting: Physician Assistant

## 2011-10-04 ENCOUNTER — Encounter: Payer: Self-pay | Admitting: Physician Assistant

## 2011-10-04 ENCOUNTER — Ambulatory Visit (INDEPENDENT_AMBULATORY_CARE_PROVIDER_SITE_OTHER): Payer: BC Managed Care – HMO | Admitting: Physician Assistant

## 2011-10-04 VITALS — BP 122/75 | HR 69 | Ht 69.0 in | Wt 268.0 lb

## 2011-10-04 DIAGNOSIS — Z13220 Encounter for screening for lipoid disorders: Secondary | ICD-10-CM

## 2011-10-04 DIAGNOSIS — Z131 Encounter for screening for diabetes mellitus: Secondary | ICD-10-CM

## 2011-10-04 DIAGNOSIS — Z23 Encounter for immunization: Secondary | ICD-10-CM

## 2011-10-04 DIAGNOSIS — Z01818 Encounter for other preprocedural examination: Secondary | ICD-10-CM

## 2011-10-04 DIAGNOSIS — R5383 Other fatigue: Secondary | ICD-10-CM

## 2011-10-04 DIAGNOSIS — R5381 Other malaise: Secondary | ICD-10-CM

## 2011-10-04 LAB — COMPREHENSIVE METABOLIC PANEL
ALT: 12 U/L (ref 0–35)
Albumin: 3.9 g/dL (ref 3.5–5.2)
CO2: 28 mEq/L (ref 19–32)
Calcium: 9.1 mg/dL (ref 8.4–10.5)
Chloride: 105 mEq/L (ref 96–112)
Glucose, Bld: 100 mg/dL — ABNORMAL HIGH (ref 70–99)
Potassium: 4.1 mEq/L (ref 3.5–5.3)
Sodium: 140 mEq/L (ref 135–145)
Total Protein: 6.6 g/dL (ref 6.0–8.3)

## 2011-10-04 LAB — CBC WITH DIFFERENTIAL/PLATELET
Basophils Relative: 0 % (ref 0–1)
Eosinophils Absolute: 0.2 10*3/uL (ref 0.0–0.7)
Eosinophils Relative: 3 % (ref 0–5)
HCT: 39.3 % (ref 36.0–46.0)
Hemoglobin: 12.8 g/dL (ref 12.0–15.0)
Lymphs Abs: 2.1 10*3/uL (ref 0.7–4.0)
MCH: 30.5 pg (ref 26.0–34.0)
MCHC: 32.6 g/dL (ref 30.0–36.0)
MCV: 93.8 fL (ref 78.0–100.0)
Monocytes Absolute: 0.3 10*3/uL (ref 0.1–1.0)
Monocytes Relative: 4 % (ref 3–12)

## 2011-10-04 LAB — TSH: TSH: 2.167 u[IU]/mL (ref 0.350–4.500)

## 2011-10-04 LAB — LIPID PANEL
Cholesterol: 205 mg/dL — ABNORMAL HIGH (ref 0–200)
Triglycerides: 120 mg/dL (ref <150)
VLDL: 24 mg/dL (ref 0–40)

## 2011-10-04 NOTE — Progress Notes (Signed)
  Subjective:    Patient ID: Alicia Davies, female    DOB: November 27, 1955, 56 y.o.   MRN: [id number]  HPI Patient is a 56 yo woman who comes to the clinic for surgical clearance for left total knee replacement. She is doing well expect for pain from knee. She is struggling to walk. She is taking a lot of NSAIDs daily to minimize the pain. It is helping. She denies any CP, SOB, palpitations, headaches, Dizziness. PMH is negative for any cardiac problems.   Patient never got fasting labs done for routine maintence and fatigue will draw today.   Review of Systems     Objective:   Physical Exam  Constitutional: She is oriented to person, place, and time. She appears well-developed and well-nourished.       Obese  HENT:  Head: Normocephalic and atraumatic.  Right Ear: External ear normal.  Left Ear: External ear normal.  Mouth/Throat: Oropharynx is clear and moist.  Eyes: Conjunctivae normal are normal.  Neck: Normal range of motion. Neck supple. No thyromegaly present.  Cardiovascular: Normal rate, regular rhythm, normal heart sounds and intact distal pulses.   Pulmonary/Chest: Effort normal and breath sounds normal. She has no wheezes.  Lymphadenopathy:    She has no cervical adenopathy.  Neurological: She is alert and oriented to person, place, and time.  Skin: Skin is warm and dry.  Psychiatric: She has a normal mood and affect. Her behavior is normal.          Assessment & Plan:  Surgical Clearance- EKG- NSR/Normal axis/good r wave progression/no ST changes. Rechecked BP 122/75 elevation earlier likely due to pain. Will check kidney,liver, CBC for any signs of infection. Will send paperwork once lab results are received. Pt will have pre op visit with surgeon but reminded to go off of NSAID 5-7 days before surgery.   Flu shot given today.

## 2011-10-04 NOTE — Patient Instructions (Addendum)
Will call with labs. Will send off form.

## 2011-10-07 ENCOUNTER — Other Ambulatory Visit: Payer: Self-pay | Admitting: Physician Assistant

## 2011-10-07 MED ORDER — PRAVASTATIN SODIUM 40 MG PO TABS: 40.0000 mg | ORAL_TABLET | Freq: Every day | ORAL | Status: DC

## 2011-10-08 ENCOUNTER — Encounter: Payer: Self-pay | Admitting: *Deleted

## 2012-02-06 ENCOUNTER — Ambulatory Visit (INDEPENDENT_AMBULATORY_CARE_PROVIDER_SITE_OTHER): Payer: BC Managed Care – HMO | Admitting: Family Medicine

## 2012-02-06 ENCOUNTER — Encounter: Payer: Self-pay | Admitting: Family Medicine

## 2012-02-06 VITALS — BP 121/75 | HR 73 | Temp 97.9°F | Resp 16 | Wt 271.0 lb

## 2012-02-06 DIAGNOSIS — E559 Vitamin D deficiency, unspecified: Secondary | ICD-10-CM

## 2012-02-06 DIAGNOSIS — E210 Primary hyperparathyroidism: Secondary | ICD-10-CM

## 2012-02-06 DIAGNOSIS — E876 Hypokalemia: Secondary | ICD-10-CM

## 2012-02-06 DIAGNOSIS — E785 Hyperlipidemia, unspecified: Secondary | ICD-10-CM

## 2012-02-06 NOTE — Progress Notes (Signed)
  Subjective:    Patient ID: Alicia Davies, female    DOB: 02/21/1955, 57 y.o.   MRN: [id number]  HPI  Was recently in the emergency department at [facility]. She was diagnosed with a kidney stone. She's had these before. She was told it was approximately 6 mm stone. I think has vomited her bladder. She has followed up with urology since then. She sees Dr. Renato Gails at [facility]. They also found that she was hypokalemic. She received potassium to her IV. She was then discharged on oral potassium.  Now on Potassium BID for almost 2 weeks.  Using fluid pill about 3 times a week when necessary swelling.  Not use for BP.  She's never had problems with potassium in the past. In fact she had a normal one in September here in our office. She has no new medications. She does take hydrochlorothiazide.  She's getting ready to have knee replacement on her left knee. She's excited to finally get this done soon. She was scheduled for last year but unfortunately her insurance to clear to a pre-existing condition and she was unable to have it. She is hoping to reschedule for this spring. Review of Systems     Objective:   Physical Exam  Constitutional: She is oriented to person, place, and time. She appears well-developed and well-nourished.  HENT:  Head: Normocephalic and atraumatic.  Cardiovascular: Normal rate, regular rhythm and normal heart sounds.   Pulmonary/Chest: Effort normal and breath sounds normal.  Neurological: She is alert and oriented to person, place, and time.  Skin: Skin is warm and dry.  Psychiatric: She has a normal mood and affect. Her behavior is normal.          Assessment & Plan:  Hypokalemia - will recheck levels today.  If potassium looks normal then she can take it only on a day that she takes her hydrochlorothiazide. Then recheck potassium in 2 weeks.  Kidney stones.  Currently being followed by urology.  Vitamin D deficiency-she's been taking her supplements as  directed. She's due to recheck her lab works we will do so today.  Hyperlipidemia-she's tolerating her statin well without any myalgias or side effects. She has not had her levels rechecked since starting it. We will do so today since she is fasting.  Primary hyperparathyroidism-she's done well since she's had her parathyroids removed. She is due to have a calcium level checked. We'll check this again today.

## 2012-02-06 NOTE — Patient Instructions (Signed)
If potassium looks normal then she can take it only on a day that she takes her hydrochlorothiazide. Then recheck potassium in 2 weeks.

## 2012-02-07 LAB — BASIC METABOLIC PANEL WITH GFR
BUN: 18 mg/dL (ref 6–23)
CO2: 29 mEq/L (ref 19–32)
Calcium: 9.5 mg/dL (ref 8.4–10.5)
GFR, Est African American: >89 mL/min
Glucose, Bld: 108 mg/dL — ABNORMAL HIGH (ref 70–99)

## 2012-02-07 LAB — LIPID PANEL
LDL Cholesterol: 108 mg/dL — ABNORMAL HIGH (ref 0–99)
Total CHOL/HDL Ratio: 4.1 Ratio
VLDL: 16 mg/dL (ref 0–40)

## 2012-03-25 ENCOUNTER — Encounter (INDEPENDENT_AMBULATORY_CARE_PROVIDER_SITE_OTHER): Payer: Self-pay | Admitting: Ophthalmology

## 2012-04-08 ENCOUNTER — Other Ambulatory Visit: Payer: Self-pay | Admitting: *Deleted

## 2012-04-08 MED ORDER — PRAVASTATIN SODIUM 40 MG PO TABS: 40.0000 mg | ORAL_TABLET | Freq: Every day | ORAL | Status: DC

## 2012-04-08 NOTE — Telephone Encounter (Signed)
Pravastatin refilled.  

## 2012-08-12 ENCOUNTER — Other Ambulatory Visit: Payer: Self-pay | Admitting: Physician Assistant

## 2012-08-27 ENCOUNTER — Other Ambulatory Visit: Payer: Self-pay | Admitting: Physician Assistant

## 2012-10-01 ENCOUNTER — Other Ambulatory Visit: Payer: Self-pay | Admitting: Family Medicine

## 2012-10-01 NOTE — Telephone Encounter (Signed)
Needs appt

## 2012-10-07 ENCOUNTER — Encounter: Payer: Self-pay | Admitting: Physician Assistant

## 2012-10-07 ENCOUNTER — Ambulatory Visit (INDEPENDENT_AMBULATORY_CARE_PROVIDER_SITE_OTHER): Payer: Self-pay | Admitting: Physician Assistant

## 2012-10-07 VITALS — BP 147/84 | HR 61 | Wt 295.0 lb

## 2012-10-07 DIAGNOSIS — Z23 Encounter for immunization: Secondary | ICD-10-CM

## 2012-10-07 DIAGNOSIS — E210 Primary hyperparathyroidism: Secondary | ICD-10-CM

## 2012-10-07 DIAGNOSIS — Z1239 Encounter for other screening for malignant neoplasm of breast: Secondary | ICD-10-CM

## 2012-10-07 DIAGNOSIS — R600 Localized edema: Secondary | ICD-10-CM

## 2012-10-07 DIAGNOSIS — R609 Edema, unspecified: Secondary | ICD-10-CM

## 2012-10-07 MED ORDER — SERTRALINE HCL 50 MG PO TABS: 50.0000 mg | ORAL_TABLET | Freq: Every day | ORAL | Status: DC

## 2012-10-07 MED ORDER — PRAVASTATIN SODIUM 40 MG PO TABS: 40.0000 mg | ORAL_TABLET | Freq: Every day | ORAL | Status: DC

## 2012-10-07 NOTE — Addendum Note (Signed)
Addended by: Jomarie Longs on: 10/07/2012 11:41 AM   Modules accepted: Orders

## 2012-10-07 NOTE — Progress Notes (Addendum)
  Subjective:    Patient ID: Alicia Davies, female    DOB: [birth year], 57 y.o.   MRN: [id number]  HPI Patient is a 57 year old female who presents to the clinic to refill her HCTZ for peripheral edema. She's been out of her diuretic for the last week or so. Swelling of her bilateral legs, bilateral hands, and even under her eyes has been worsening over the past month but definitely increased more in the last week being out of her diuretic. She has started using the CPAP and is doing really well. She has a lot more energy. Patient denies any shortness of breath or leg pain.  Review of Systems     Objective:   Physical Exam  Constitutional: She is oriented to person, place, and time. She appears well-developed and well-nourished.  Obese.   HENT:  Head: Normocephalic and atraumatic.  Neck: Normal range of motion. Neck supple. No JVD present. No thyromegaly present.  Cardiovascular: Normal rate, regular rhythm, normal heart sounds and intact distal pulses.   Pulmonary/Chest: Effort normal and breath sounds normal. She has no wheezes. She has no rales.  Lymphadenopathy:    She has no cervical adenopathy.  Neurological: She is alert and oriented to person, place, and time.  Skin:  1+bilateral leg edema.   Psychiatric: She has a normal mood and affect. Her behavior is normal.          Assessment & Plan:  Lower extremity edema- will check kidney, liver and electrolytes. If everything in check will increase HCTZ to 25mg  along with potassium to help with swelling. Will get a BNP to make sure overall fluid accumulation is normal. Discussed compression stockings, low salt diet, keeping feet elevated. Gave handout. Follow up as needed or in 6 months. Reassured pt that I do not thin cPAP is causing fluid accumulation.   Primary hyperparathyroidism- her last her to over 6 months ago and were little elevated but seemed stable viewing previous labs. Dr. Glade Lloyd suggested recheck to make sure continuing  to be stable.  Flu shot given today without complications.

## 2012-10-07 NOTE — Patient Instructions (Signed)

## 2012-10-08 LAB — COMPLETE METABOLIC PANEL WITH GFR
ALT: 17 U/L (ref 0–35)
Alkaline Phosphatase: 81 U/L (ref 39–117)
CO2: 31 mEq/L (ref 19–32)
Creat: 0.94 mg/dL (ref 0.50–1.10)
GFR, Est African American: 80 mL/min
Total Bilirubin: 0.7 mg/dL (ref 0.3–1.2)

## 2012-10-08 LAB — BRAIN NATRIURETIC PEPTIDE: Brain Natriuretic Peptide: 30.5 pg/mL (ref 0.0–100.0)

## 2012-10-09 ENCOUNTER — Other Ambulatory Visit: Payer: Self-pay | Admitting: Physician Assistant

## 2012-10-09 LAB — PTH, INTACT AND CALCIUM
Calcium: 9.8 mg/dL (ref 8.4–10.5)
PTH: 39.6 pg/mL (ref 14.0–72.0)

## 2012-10-09 MED ORDER — HYDROCHLOROTHIAZIDE 25 MG PO TABS: 25.0000 mg | ORAL_TABLET | Freq: Every day | ORAL | Status: DC

## 2012-10-09 MED ORDER — POTASSIUM CHLORIDE ER 10 MEQ PO TBCR: 10.0000 meq | EXTENDED_RELEASE_TABLET | Freq: Two times a day (BID) | ORAL | Status: DC

## 2012-10-12 ENCOUNTER — Encounter: Payer: Self-pay | Admitting: Family Medicine

## 2012-10-12 DIAGNOSIS — G4733 Obstructive sleep apnea (adult) (pediatric): Secondary | ICD-10-CM | POA: Insufficient documentation

## 2012-10-13 ENCOUNTER — Ambulatory Visit (INDEPENDENT_AMBULATORY_CARE_PROVIDER_SITE_OTHER): Payer: BC Managed Care – PPO

## 2012-10-13 DIAGNOSIS — Z1239 Encounter for other screening for malignant neoplasm of breast: Secondary | ICD-10-CM

## 2012-10-13 DIAGNOSIS — R928 Other abnormal and inconclusive findings on diagnostic imaging of breast: Secondary | ICD-10-CM

## 2012-10-13 DIAGNOSIS — Z1231 Encounter for screening mammogram for malignant neoplasm of breast: Secondary | ICD-10-CM

## 2012-10-15 ENCOUNTER — Other Ambulatory Visit: Payer: Self-pay | Admitting: Physician Assistant

## 2012-10-15 DIAGNOSIS — R928 Other abnormal and inconclusive findings on diagnostic imaging of breast: Secondary | ICD-10-CM

## 2012-10-28 ENCOUNTER — Ambulatory Visit
Admission: RE | Admit: 2012-10-28 | Discharge: 2012-10-28 | Disposition: A | Discharge status: Home / Self Care | Payer: BC Managed Care – PPO | Source: Ambulatory Visit | Attending: Physician Assistant | Admitting: Physician Assistant

## 2012-10-28 ENCOUNTER — Encounter: Payer: Self-pay | Admitting: Physician Assistant

## 2012-10-28 DIAGNOSIS — N6002 Solitary cyst of left breast: Secondary | ICD-10-CM | POA: Insufficient documentation

## 2012-10-28 DIAGNOSIS — R928 Other abnormal and inconclusive findings on diagnostic imaging of breast: Secondary | ICD-10-CM

## 2013-05-05 ENCOUNTER — Ambulatory Visit (INDEPENDENT_AMBULATORY_CARE_PROVIDER_SITE_OTHER): Payer: BC Managed Care – PPO | Admitting: Family Medicine

## 2013-05-05 ENCOUNTER — Encounter: Payer: Self-pay | Admitting: Family Medicine

## 2013-05-05 VITALS — BP 135/82 | HR 64 | Ht 69.0 in | Wt 311.0 lb

## 2013-05-05 DIAGNOSIS — Z01818 Encounter for other preprocedural examination: Secondary | ICD-10-CM

## 2013-05-05 DIAGNOSIS — E210 Primary hyperparathyroidism: Secondary | ICD-10-CM

## 2013-05-05 NOTE — Progress Notes (Signed)
CC: Pattricia BossDebra Levandowski is a 58 y.o. female is here for surgical clearance   Subjective: HPI:  Preoperative clearance visit for left knee revision scheduled for early May with [facility].  Patient carries a history of well-controlled hypertension, no history of diabetes, no family history of heart disease, no personal history of heart disease.  She denies tobacco use recently or remotely.  She can climb a flight of stairs without shortness of breath, limb claudication, nor chest pain. She denies any recent or remote limb claudication or chest pain. Denies any irregular heartbeat.  She takes hydrochlorothiazide and potassium supplementation for blood pressure and peripheral edema which she believes has been well-controlled. She believes she is in her regular state of health other than chronic left knee pain. Denies fevers, chills, flushing, abdominal pain, nausea, vomiting, motor or sensory disturbances, cough nor wheezing.   Review Of Systems Outlined In HPI  Past Medical History  Diagnosis Date  . Hyperparathyroidism   . Kidney stones     Past Surgical History  Procedure Laterality Date  . Parathyroidectomy  04/2010    done by Dr Gerrit FriendsGerkin  . Appendectomy    . Abdominal hysterectomy    . Cholecystectomy    . Knee arthroscopy      Right and lft.   . Foot surgery    . Shoulder surgery     Family History  Problem Relation Age of Onset  . Diabetes Mother   . Heart failure Mother   . Diabetes Brother   . Heart failure Brother     History   Social History  . Marital Status: Married    Spouse Name: N/A    Number of Children: N/A  . Years of Education: N/A   Occupational History  . Not on file.   Social History Main Topics  . Smoking status: Never Smoker   . Smokeless tobacco: Not on file  . Alcohol Use: No  . Drug Use: No  . Sexual Activity: Not on file   Other Topics Concern  . Not on file   Social History Narrative  . No narrative on file     Objective: BP 135/82   Pulse 64  Ht 5\' 9"  (1.753 m)  Wt 311 lb (141.069 kg)  BMI 45.91 kg/m2  General: Alert and Oriented, No Acute Distress HEENT: Pupils equal, round, reactive to light. Conjunctivae clear.  External ears unremarkable, canals clear with intact TMs with appropriate landmarks.  Middle ear appears open without effusion. Pink inferior turbinates.  Moist mucous membranes, pharynx without inflammation nor lesions.  Neck supple without palpable lymphadenopathy nor abnormal masses. Lungs: Clear to auscultation bilaterally, no wheezing/ronchi/rales.  Comfortable work of breathing. Good air movement. Cardiac: Regular rate and rhythm. Normal S1/S2.  No murmurs, rubs, nor gallops.   Extremities: No peripheral edema.  Strong peripheral pulses.  Mental Status: No depression, anxiety, nor agitation. Skin: Warm and dry.  Assessment & Plan: Stanton KidneyDebra was seen today for surgical clearance.  Diagnoses and associated orders for this visit:  Preoperative clearance - COMPLETE METABOLIC PANEL WITH GFR - PR ELECTROCARDIOGRAM, COMPLETE  Hypercalcemia - COMPLETE METABOLIC PANEL WITH GFR - PR ELECTROCARDIOGRAM, COMPLETE  Primary hyperparathyroidism - COMPLETE METABOLIC PANEL WITH GFR - PR ELECTROCARDIOGRAM, COMPLETE    I believe she is low risk for a cardiovascular event with her upcoming elective left knee revision.  Due to history of hypercalcemia and the use of hydrochlorothiazide with potassium we'll check a complete metabolic panel today to look for any renal  abnormalities that could contribute to conduction abnormalities..  EKG was obtained showing normal sinus rhythm, left axis deviation, normal intervals, no ST elevation or depression, no pathologic Q waves, normal T-wave morphology.  25 minutes spent face-to-face during visit today of which at least 50% was counseling or coordinating care regarding: 1. Preoperative clearance   2. Hypercalcemia   3. Primary hyperparathyroidism      Return if symptoms  worsen or fail to improve.

## 2013-05-06 LAB — COMPLETE METABOLIC PANEL WITH GFR
ALK PHOS: 86 U/L (ref 39–117)
ALT: 27 U/L (ref 0–35)
AST: 23 U/L (ref 0–37)
Albumin: 4.2 g/dL (ref 3.5–5.2)
BILIRUBIN TOTAL: 0.7 mg/dL (ref 0.2–1.2)
BUN: 18 mg/dL (ref 6–23)
CO2: 28 mEq/L (ref 19–32)
CREATININE: 0.74 mg/dL (ref 0.50–1.10)
Calcium: 9.4 mg/dL (ref 8.4–10.5)
Chloride: 103 mEq/L (ref 96–112)
GFR, Est African American: >89 mL/min
GFR, Est Non African American: >89 mL/min
Glucose, Bld: 94 mg/dL (ref 70–99)
Potassium: 4.2 mEq/L (ref 3.5–5.3)
Sodium: 137 mEq/L (ref 135–145)
Total Protein: 6.9 g/dL (ref 6.0–8.3)

## 2013-05-07 ENCOUNTER — Other Ambulatory Visit: Payer: Self-pay | Admitting: Physician Assistant

## 2013-05-07 ENCOUNTER — Encounter: Payer: Self-pay | Admitting: *Deleted

## 2013-06-14 ENCOUNTER — Other Ambulatory Visit: Payer: Self-pay | Admitting: Physician Assistant

## 2013-07-08 ENCOUNTER — Other Ambulatory Visit: Payer: Self-pay | Admitting: Physician Assistant

## 2013-07-21 ENCOUNTER — Ambulatory Visit (INDEPENDENT_AMBULATORY_CARE_PROVIDER_SITE_OTHER): Payer: BC Managed Care – PPO | Admitting: Physician Assistant

## 2013-07-21 ENCOUNTER — Encounter: Payer: Self-pay | Admitting: Physician Assistant

## 2013-07-21 VITALS — BP 144/78 | HR 61 | Ht 69.0 in | Wt 317.0 lb

## 2013-07-21 DIAGNOSIS — F32A Depression, unspecified: Secondary | ICD-10-CM

## 2013-07-21 DIAGNOSIS — F329 Major depressive disorder, single episode, unspecified: Secondary | ICD-10-CM

## 2013-07-21 DIAGNOSIS — F3289 Other specified depressive episodes: Secondary | ICD-10-CM

## 2013-07-21 DIAGNOSIS — E785 Hyperlipidemia, unspecified: Secondary | ICD-10-CM

## 2013-07-21 DIAGNOSIS — I10 Essential (primary) hypertension: Secondary | ICD-10-CM

## 2013-07-21 DIAGNOSIS — M7989 Other specified soft tissue disorders: Secondary | ICD-10-CM

## 2013-07-21 HISTORY — DX: Depression, unspecified: F32.A

## 2013-07-21 HISTORY — DX: Essential (primary) hypertension: I10

## 2013-07-21 HISTORY — DX: Hyperlipidemia, unspecified: E78.5

## 2013-07-21 MED ORDER — PRAVASTATIN SODIUM 40 MG PO TABS: 40.0000 mg | ORAL_TABLET | Freq: Every day | ORAL | Status: DC

## 2013-07-21 MED ORDER — POTASSIUM CHLORIDE ER 10 MEQ PO TBCR: 10.0000 meq | EXTENDED_RELEASE_TABLET | Freq: Two times a day (BID) | ORAL | Status: DC

## 2013-07-21 MED ORDER — HYDROCHLOROTHIAZIDE 25 MG PO TABS: 25.0000 mg | ORAL_TABLET | Freq: Every day | ORAL | Status: DC

## 2013-07-21 MED ORDER — SERTRALINE HCL 50 MG PO TABS: ORAL_TABLET | ORAL | Status: DC

## 2013-07-21 NOTE — Progress Notes (Addendum)
   Subjective:    Patient ID: Alicia Davies, female    DOB: 18-Aug-1955, 58 y.o.   MRN: [id number]  HPI Patient is a 58 year old female who presents to the clinic for followup on depression. Patient also needs refills on other medications today. Patient feels very controlled today on Zoloft 50 mg once a day. She has no complaints or concerns. She denies any side effects. She denies any suicidal or homicidal thoughts.  Hyperlipidemia-needs refill on Pravachol. Takes medication daily.   Hypertension-patient is on a diuretic as well as a potassium tablet daily. She denies any chest pain, palpitations, headache, vision changes.  Obesity-patient has gained weight over the past year due to left knee injury and surgery and depression. She really would like to get this weight off. She has not set up a diet or exercise plan at this time.     Review of Systems  All other systems reviewed and are negative.      Objective:   Physical Exam  Constitutional: She is oriented to person, place, and time. She appears well-developed and well-nourished.  HENT:  Head: Normocephalic and atraumatic.  Cardiovascular: Normal rate, regular rhythm and normal heart sounds.   Pulmonary/Chest: Effort normal and breath sounds normal.  Neurological: She is alert and oriented to person, place, and time.  Skin: Skin is dry.  Psychiatric: She has a normal mood and affect. Her behavior is normal.          Assessment & Plan:  HTN/occasional lower leg extremity edema.- a little elevated today. Controlled previously. Will continue on HCTZ and potassium tablets. Her CMP was recently done at her preop clearance. Will not do lab work today.  Depression - PHQ-9 was 1. Looks great. Refilled zoloft today for 6 months.   Obesity- discussed weight loss drugs options. Pt would like to try diet and exercise at this time. I ok Garcinia Cambodgia OTC but reassured pt that no studies have been done to show effectiveness.  Encouraged patient to keep diet to 15-1800 calories a day as well as daily exercise 30 minutes. Encouraged patient to get out on her smart fallback could encourage her and help her track her calories. Followup in 2 months.  Hyperlipidemia-protocol refill today. Labs are up to date. Will check labs in the next 6 months.

## 2013-07-23 ENCOUNTER — Ambulatory Visit (INDEPENDENT_AMBULATORY_CARE_PROVIDER_SITE_OTHER): Payer: BC Managed Care – PPO | Admitting: Family Medicine

## 2013-07-23 ENCOUNTER — Telehealth: Payer: Self-pay | Admitting: *Deleted

## 2013-07-23 ENCOUNTER — Ambulatory Visit (INDEPENDENT_AMBULATORY_CARE_PROVIDER_SITE_OTHER): Payer: BC Managed Care – PPO

## 2013-07-23 ENCOUNTER — Encounter: Payer: Self-pay | Admitting: Family Medicine

## 2013-07-23 VITALS — BP 136/84 | HR 66 | Temp 98.6°F | Wt 316.0 lb

## 2013-07-23 DIAGNOSIS — R112 Nausea with vomiting, unspecified: Secondary | ICD-10-CM

## 2013-07-23 DIAGNOSIS — N210 Calculus in bladder: Secondary | ICD-10-CM

## 2013-07-23 DIAGNOSIS — K831 Obstruction of bile duct: Secondary | ICD-10-CM

## 2013-07-23 DIAGNOSIS — N200 Calculus of kidney: Secondary | ICD-10-CM

## 2013-07-23 DIAGNOSIS — R109 Unspecified abdominal pain: Secondary | ICD-10-CM

## 2013-07-23 DIAGNOSIS — R10A1 Flank pain, right side: Secondary | ICD-10-CM

## 2013-07-23 LAB — POCT URINALYSIS DIPSTICK
BILIRUBIN UA: NEGATIVE
Glucose, UA: NEGATIVE
KETONES UA: NEGATIVE
Leukocytes, UA: NEGATIVE
Nitrite, UA: NEGATIVE
Protein, UA: NEGATIVE
Spec Grav, UA: 1.02
UROBILINOGEN UA: 0.2
pH, UA: 5.5

## 2013-07-23 MED ORDER — TAMSULOSIN HCL 0.4 MG PO CAPS
0.4000 mg | ORAL_CAPSULE | Freq: Every day | ORAL | Status: DC
Start: 2013-07-23 — End: 2013-11-26

## 2013-07-23 MED ORDER — KETOROLAC TROMETHAMINE 60 MG/2ML IM SOLN
60.0000 mg | Freq: Once | INTRAMUSCULAR | Status: AC
  Administered 2013-07-23: 60 mg via INTRAMUSCULAR

## 2013-07-23 MED ORDER — ONDANSETRON HCL 4 MG PO TABS
8.0000 mg | ORAL_TABLET | Freq: Once | ORAL | Status: AC
  Administered 2013-07-23: 8 mg via ORAL

## 2013-07-23 MED ORDER — ONDANSETRON HCL 4 MG PO TABS: 4.0000 mg | ORAL_TABLET | Freq: Three times a day (TID) | ORAL | Status: DC | PRN

## 2013-07-23 MED ORDER — HYDROCODONE-ACETAMINOPHEN 10-325 MG PO TABS: 0.5000 | ORAL_TABLET | Freq: Four times a day (QID) | ORAL | Status: DC | PRN

## 2013-07-23 NOTE — Telephone Encounter (Signed)
Auth for CT scan [id number] is [id number] valid 07/23/13-08/21/13. Spoke with Dollar GeneralMarissa @ [employer]. Radiology notified. Corliss SkainsJamie Meria Crilly, CMA

## 2013-07-23 NOTE — Progress Notes (Signed)
CC: Alicia Davies is a 58 y.o. female is here for Pyelonephritis   Subjective: HPI:  Complains of awakening due to right flank pain that began at 7 AM this morning. It is described as a sharpness cramping sensation that is radiating into the right groin. Nothing particularly makes it better or worse, she cannot find a position that provides her any discomfort. Has been accompanied by urinary frequency but no other urinary changes specifically urgency hesitancy dysuria nor blood in urine. Pain is ranging from moderate to severe in severity, when severe it is causing vomiting. She denies fevers, chills, chest pain, abdominal pain, GI disturbance. She has a history of kidney stones one requiring lithotripsy.   Review Of Systems Outlined In HPI  Past Medical History  Diagnosis Date  . Hyperparathyroidism   . Kidney stones     Past Surgical History  Procedure Laterality Date  . Parathyroidectomy  04/2010    done by Dr Gerrit FriendsGerkin  . Appendectomy    . Abdominal hysterectomy    . Cholecystectomy    . Knee arthroscopy      Right and lft.   . Foot surgery    . Shoulder surgery     Family History  Problem Relation Age of Onset  . Diabetes Mother   . Heart failure Mother   . Diabetes Brother   . Heart failure Brother     History   Social History  . Marital Status: Married    Spouse Name: N/A    Number of Children: N/A  . Years of Education: N/A   Occupational History  . Not on file.   Social History Main Topics  . Smoking status: Never Smoker   . Smokeless tobacco: Not on file  . Alcohol Use: No  . Drug Use: No  . Sexual Activity: Not on file   Other Topics Concern  . Not on file   Social History Narrative  . No narrative on file     Objective: BP 136/84  Pulse 66  Temp(Src) 98.6 F (37 C) (Oral)  Wt 316 lb (143.337 kg)  General: Alert and Oriented, appears mild to moderately uncomfortable HEENT: Pupils equal, round, reactive to light. Conjunctivae clear.  Moist  mucous members pharynx unremarkable Lungs: Clear to auscultation bilaterally, no wheezing/ronchi/rales.  Comfortable work of breathing. Good air movement. Cardiac: Regular rate and rhythm. Normal S1/S2.  No murmurs, rubs, nor gallops.   Abdomen: Obese soft nontender with no costovertebral angle tenderness, I cannot reproduce her pain with palpation of the right flank Extremities: No peripheral edema.  Strong peripheral pulses.  Mental Status: No depression, anxiety, nor agitation. Skin: Warm and dry. No overlying skin changes at the site of her pain  Assessment & Plan: Stanton KidneyDebra was seen today for pyelonephritis.  Diagnoses and associated orders for this visit:  Right flank pain - CT Abdomen Pelvis Wo Contrast; Future - Urinalysis Dipstick - Urine Culture - ketorolac (TORADOL) injection 60 mg; Inject 2 mLs (60 mg total) into the muscle once.  Non-intractable vomiting with nausea, vomiting of unspecified type - CT Abdomen Pelvis Wo Contrast; Future - Urinalysis Dipstick - Urine Culture - ondansetron (ZOFRAN) tablet 8 mg; Take 2 tablets (8 mg total) by mouth once.  Nephrolithiasis - ondansetron (ZOFRAN) 4 MG tablet; Take 1-2 tablets (4-8 mg total) by mouth every 8 (eight) hours as needed for nausea or vomiting. - HYDROcodone-acetaminophen (NORCO) 10-325 MG per tablet; Take 0.5-1 tablets by mouth every 6 (six) hours as needed for moderate pain. -  tamsulosin (FLOMAX) 0.4 MG CAPS capsule; Take 1 capsule (0.4 mg total) by mouth daily.   \\ Presentation urinalysis suggestive of nephrolithiasis, a CT scan was obtained to determine the size of her suspected stone, I was able to identify what I thought was a 3.5 mm stone in the right ureter at the UVJ. Later radiology identified this as 3 mm and a 2 mm stone in the bladder. Based on size I discussed with patient on optimistic that this will pass with hydrocodone, Flomax and Zofran to keep fluids down in order to stay well-hydrated. She was given a  single dose of Zofran 8 mg in the office and ketorolac for pain control.Signs and symptoms requring emergent/urgent reevaluation were discussed with the patient. Followup with her urologist on Monday if no better  Return if symptoms worsen or fail to improve.

## 2013-07-25 LAB — URINE CULTURE: Colony Count: 5000

## 2013-09-29 ENCOUNTER — Encounter: Payer: Self-pay | Admitting: Physician Assistant

## 2013-09-29 DIAGNOSIS — G4733 Obstructive sleep apnea (adult) (pediatric): Secondary | ICD-10-CM | POA: Insufficient documentation

## 2013-09-29 HISTORY — DX: Obstructive sleep apnea (adult) (pediatric): G47.33

## 2013-10-04 ENCOUNTER — Other Ambulatory Visit: Payer: Self-pay | Admitting: Family Medicine

## 2013-10-04 DIAGNOSIS — Z139 Encounter for screening, unspecified: Secondary | ICD-10-CM

## 2013-10-14 ENCOUNTER — Ambulatory Visit (INDEPENDENT_AMBULATORY_CARE_PROVIDER_SITE_OTHER): Payer: BC Managed Care – PPO

## 2013-10-14 DIAGNOSIS — Z1231 Encounter for screening mammogram for malignant neoplasm of breast: Secondary | ICD-10-CM

## 2013-10-14 DIAGNOSIS — Z139 Encounter for screening, unspecified: Secondary | ICD-10-CM

## 2013-11-26 ENCOUNTER — Encounter: Payer: Self-pay | Admitting: Physician Assistant

## 2013-11-26 ENCOUNTER — Ambulatory Visit (INDEPENDENT_AMBULATORY_CARE_PROVIDER_SITE_OTHER): Payer: BC Managed Care – PPO | Admitting: Physician Assistant

## 2013-11-26 VITALS — BP 136/74 | HR 73 | Ht 69.0 in | Wt 319.0 lb

## 2013-11-26 DIAGNOSIS — Z01818 Encounter for other preprocedural examination: Secondary | ICD-10-CM

## 2013-11-26 NOTE — Progress Notes (Signed)
   Subjective:    Patient ID: Alicia Davies, female    DOB: [birth year], 58 y.o.   MRN: [id number]  HPI Pt is a 58 yo female who presents to the clinic for pre-surgical clearance for right total knee replacement. No concerns or complaints today. No CP, SOB, weakness or fatigued. Taking motrin for knee pain as needed. Left knee done last year with no complications.    Review of Systems  All other systems reviewed and are negative.      Objective:   Physical Exam  Constitutional: She is oriented to person, place, and time. She appears well-developed and well-nourished.  HENT:  Head: Normocephalic and atraumatic.  Right Ear: External ear normal.  Left Ear: External ear normal.  Nose: Nose normal.  Mouth/Throat: Oropharynx is clear and moist.  Eyes: Conjunctivae are normal.  Neck: Normal range of motion. Neck supple.  Cardiovascular: Normal rate, regular rhythm and normal heart sounds.   Pulmonary/Chest: Effort normal and breath sounds normal. She has no wheezes.  Lymphadenopathy:    She has no cervical adenopathy.  Neurological: She is alert and oriented to person, place, and time.  Skin: Skin is dry.  Psychiatric: She has a normal mood and affect. Her behavior is normal.          Assessment & Plan:  Preoperative clearance for right total knee replacement- EKG NSR, no ST elevation, good r wave production. cmp and cbc ordered. Flu shot given. Discussed to stop NSAID 7 days before surgery. Will give clearance pending labs.

## 2013-11-27 LAB — COMPLETE METABOLIC PANEL WITH GFR
ALBUMIN: 3.9 g/dL (ref 3.5–5.2)
ALT: 25 U/L (ref 0–35)
AST: 23 U/L (ref 0–37)
Alkaline Phosphatase: 82 U/L (ref 39–117)
BILIRUBIN TOTAL: 0.7 mg/dL (ref 0.2–1.2)
BUN: 16 mg/dL (ref 6–23)
CO2: 27 meq/L (ref 19–32)
Calcium: 9.2 mg/dL (ref 8.4–10.5)
Chloride: 101 mEq/L (ref 96–112)
Creat: 0.79 mg/dL (ref 0.50–1.10)
GFR, Est African American: >89 mL/min
GFR, Est Non African American: 85 mL/min
Glucose, Bld: 111 mg/dL — ABNORMAL HIGH (ref 70–99)
POTASSIUM: 4.5 meq/L (ref 3.5–5.3)
SODIUM: 140 meq/L (ref 135–145)
Total Protein: 6.7 g/dL (ref 6.0–8.3)

## 2013-11-27 LAB — CBC WITH DIFFERENTIAL/PLATELET
BASOS ABS: 0 10*3/uL (ref 0.0–0.1)
BASOS PCT: 0 % (ref 0–1)
EOS ABS: 0.4 10*3/uL (ref 0.0–0.7)
EOS PCT: 4 % (ref 0–5)
HCT: 41 % (ref 36.0–46.0)
Hemoglobin: 13.8 g/dL (ref 12.0–15.0)
Lymphocytes Relative: 25 % (ref 12–46)
Lymphs Abs: 2.2 10*3/uL (ref 0.7–4.0)
MCH: 30.5 pg (ref 26.0–34.0)
MCHC: 33.7 g/dL (ref 30.0–36.0)
MCV: 90.7 fL (ref 78.0–100.0)
Monocytes Absolute: 0.5 10*3/uL (ref 0.1–1.0)
Monocytes Relative: 6 % (ref 3–12)
Neutro Abs: 5.7 10*3/uL (ref 1.7–7.7)
Neutrophils Relative %: 65 % (ref 43–77)
PLATELETS: 230 10*3/uL (ref 150–400)
RBC: 4.52 MIL/uL (ref 3.87–5.11)
RDW: 13.2 % (ref 11.5–15.5)
WBC: 8.8 10*3/uL (ref 4.0–10.5)

## 2014-01-17 NOTE — Addendum Note (Signed)
Addended by: Chalmers Cater on: 01/17/2014 01:10 PM   Modules accepted: Orders

## 2014-02-28 ENCOUNTER — Encounter: Payer: Self-pay | Admitting: Physician Assistant

## 2014-02-28 DIAGNOSIS — Z9889 Other specified postprocedural states: Secondary | ICD-10-CM | POA: Insufficient documentation

## 2014-03-22 ENCOUNTER — Ambulatory Visit (INDEPENDENT_AMBULATORY_CARE_PROVIDER_SITE_OTHER): Payer: BLUE CROSS/BLUE SHIELD | Admitting: Physician Assistant

## 2014-03-22 ENCOUNTER — Encounter: Payer: Self-pay | Admitting: Physician Assistant

## 2014-03-22 VITALS — BP 135/74 | HR 64 | Ht 69.0 in | Wt 313.0 lb

## 2014-03-22 DIAGNOSIS — M62830 Muscle spasm of back: Secondary | ICD-10-CM | POA: Diagnosis not present

## 2014-03-22 DIAGNOSIS — M62838 Other muscle spasm: Secondary | ICD-10-CM | POA: Insufficient documentation

## 2014-03-22 MED ORDER — CYCLOBENZAPRINE HCL 10 MG PO TABS: 10.0000 mg | ORAL_TABLET | Freq: Three times a day (TID) | ORAL | Status: DC | PRN

## 2014-03-22 MED ORDER — MELOXICAM 15 MG PO TABS: 15.0000 mg | ORAL_TABLET | Freq: Every day | ORAL | Status: DC

## 2014-03-22 MED ORDER — DICLOFENAC SODIUM 2 % TD SOLN: TRANSDERMAL | Status: DC

## 2014-03-22 NOTE — Progress Notes (Signed)
   Subjective:    Patient ID: Alicia Davies, female    DOB: 15-Dec-1955, 59 y.o.   MRN: [id number]  HPI  Alicia Davies presents to the clinic with 2 months of episodes left lower back pain. Alicia Davies recently had right knee surgery and still healing. Still difficult to walk and ambulate. Alicia Davies feels like Alicia Davies back "locks up" at times. When Alicia Davies turns over in bed or tries to get up Alicia Davies back with lock up. vaccuming can also cause trigger. Sometimes there is no pain. During episodes pain is 9/10. Alicia Davies denies any trauma or injury. Tried some occasional ibuprofen. Denies any radiation in legs. No saddle anesthesia, bowel or bladder dysfunction.    Review of Systems  All other systems reviewed and are negative.      Objective:   Physical Exam  Constitutional: Alicia Davies is oriented to person, place, and time. Alicia Davies appears well-developed and well-nourished.  HENT:  Head: Normocephalic and atraumatic.  Cardiovascular: Normal rate, regular rhythm and normal heart sounds.   Pulmonary/Chest: Effort normal and breath sounds normal. Alicia Davies has no wheezes.  Musculoskeletal:  No pain over spine.  Tight left sided paraspinous muscles. No tenderness today.  ROM limited due to body habitus today.  Negative straight leg test, bilaterally.   Neurological: Alicia Davies is alert and oriented to person, place, and time.  Skin: Skin is dry.  Psychiatric: Alicia Davies has a normal mood and affect. Alicia Davies behavior is normal.          Assessment & Plan:  Muscle spasm of lower back- mobic to start daily for next 2 weeks. pennsaid sample to try on affected afrea. Flexeril as needed. Work on good posture and low back stretches. Ice and heat. Try epson salt baths. No red flags will avoid imaging today. Alicia Davies has tramadol for acute pain at home.  Consider massage therapetic and/or Alicia Davies if not improving.

## 2014-03-22 NOTE — Patient Instructions (Addendum)
Consider PT and/or massage.  Consider epson salt baths.   Low Back Sprain with Rehab  A sprain is an injury in which a ligament is torn. The ligaments of the lower back are vulnerable to sprains. However, they are strong and require great force to be injured. These ligaments are important for stabilizing the spinal column. Sprains are classified into three categories. Grade 1 sprains cause pain, but the tendon is not lengthened. Grade 2 sprains include a lengthened ligament, due to the ligament being stretched or partially ruptured. With grade 2 sprains there is still function, although the function may be decreased. Grade 3 sprains involve a complete tear of the tendon or muscle, and function is usually impaired. SYMPTOMS   Severe pain in the lower back.  Sometimes, a feeling of a "pop," "snap," or tear, at the time of injury.  Tenderness and sometimes swelling at the injury site.  Uncommonly, bruising (contusion) within 48 hours of injury.  Muscle spasms in the back. CAUSES  Low back sprains occur when a force is placed on the ligaments that is greater than they can handle. Common causes of injury include:  Performing a stressful act while off-balance.  Repetitive stressful activities that involve movement of the lower back.  Direct hit (trauma) to the lower back. RISK INCREASES WITH:  Contact sports (football, wrestling).  Collisions (major skiing accidents).  Sports that require throwing or lifting (baseball, weightlifting).  Sports involving twisting of the spine (gymnastics, diving, tennis, golf).  Poor strength and flexibility.  Inadequate protection.  Previous back injury or surgery (especially fusion). PREVENTION  Wear properly fitted and padded protective equipment.  Warm up and stretch properly before activity.  Allow for adequate recovery between workouts.  Maintain physical fitness:  Strength, flexibility, and endurance.  Cardiovascular  fitness.  Maintain a healthy body weight. PROGNOSIS  If treated properly, low back sprains usually heal with non-surgical treatment. The length of time for healing depends on the severity of the injury.  RELATED COMPLICATIONS   Recurring symptoms, resulting in a chronic problem.  Chronic inflammation and pain in the low back.  Delayed healing or resolution of symptoms, especially if activity is resumed too soon.  Prolonged impairment.  Unstable or arthritic joints of the low back. TREATMENT  Treatment first involves the use of ice and medicine, to reduce pain and inflammation. The use of strengthening and stretching exercises may help reduce pain with activity. These exercises may be performed at home or with a therapist. Severe injuries may require referral to a therapist for further evaluation and treatment, such as ultrasound. Your caregiver may advise that you wear a back brace or corset, to help reduce pain and discomfort. Often, prolonged bed rest results in greater harm then benefit. Corticosteroid injections may be recommended. However, these should be reserved for the most serious cases. It is important to avoid using your back when lifting objects. At night, sleep on your back on a firm mattress, with a pillow placed under your knees. If non-surgical treatment is unsuccessful, surgery may be needed.  MEDICATION   If pain medicine is needed, nonsteroidal anti-inflammatory medicines (aspirin and ibuprofen), or other minor pain relievers (acetaminophen), are often advised.  Do not take pain medicine for 7 days before surgery.  Prescription pain relievers may be given, if your caregiver thinks they are needed. Use only as directed and only as much as you need.  Ointments applied to the skin may be helpful.  Corticosteroid injections may be given by your  caregiver. These injections should be reserved for the most serious cases, because they may only be given a certain number of  times. HEAT AND COLD  Cold treatment (icing) should be applied for 10 to 15 minutes every 2 to 3 hours for inflammation and pain, and immediately after activity that aggravates your symptoms. Use ice packs or an ice massage.  Heat treatment may be used before performing stretching and strengthening activities prescribed by your caregiver, physical therapist, or athletic trainer. Use a heat pack or a warm water soak. SEEK MEDICAL CARE IF:   Symptoms get worse or do not improve in 2 to 4 weeks, despite treatment.  You develop numbness or weakness in either leg.  You lose bowel or bladder function.  Any of the following occur after surgery: fever, increased pain, swelling, redness, drainage of fluids, or bleeding in the affected area.  New, unexplained symptoms develop. (Drugs used in treatment may produce side effects.) EXERCISES  RANGE OF MOTION (ROM) AND STRETCHING EXERCISES - Low Back Sprain Most people with lower back pain will find that their symptoms get worse with excessive bending forward (flexion) or arching at the lower back (extension). The exercises that will help resolve your symptoms will focus on the opposite motion.  Your physician, physical therapist or athletic trainer will help you determine which exercises will be most helpful to resolve your lower back pain. Do not complete any exercises without first consulting with your caregiver. Discontinue any exercises which make your symptoms worse, until you speak to your caregiver. If you have pain, numbness or tingling which travels down into your buttocks, leg or foot, the goal of the therapy is for these symptoms to move closer to your back and eventually resolve. Sometimes, these leg symptoms will get better, but your lower back pain may worsen. This is often an indication of progress in your rehabilitation. Be very alert to any changes in your symptoms and the activities in which you participated in the 24 hours prior to the  change. Sharing this information with your caregiver will allow him or her to most efficiently treat your condition. These exercises may help you when beginning to rehabilitate your injury. Your symptoms may resolve with or without further involvement from your physician, physical therapist or athletic trainer. While completing these exercises, remember:   Restoring tissue flexibility helps normal motion to return to the joints. This allows healthier, less painful movement and activity.  An effective stretch should be held for at least 30 seconds.  A stretch should never be painful. You should only feel a gentle lengthening or release in the stretched tissue. FLEXION RANGE OF MOTION AND STRETCHING EXERCISES: STRETCH - Flexion, Single Knee to Chest   Lie on a firm bed or floor with both legs extended in front of you.  Keeping one leg in contact with the floor, bring your opposite knee to your chest. Hold your leg in place by either grabbing behind your thigh or at your knee.  Pull until you feel a gentle stretch in your low back. Hold __________ seconds.  Slowly release your grasp and repeat the exercise with the opposite side. Repeat __________ times. Complete this exercise __________ times per day.  STRETCH - Flexion, Double Knee to Chest  Lie on a firm bed or floor with both legs extended in front of you.  Keeping one leg in contact with the floor, bring your opposite knee to your chest.  Tense your stomach muscles to support your back and  then lift your other knee to your chest. Hold your legs in place by either grabbing behind your thighs or at your knees.  Pull both knees toward your chest until you feel a gentle stretch in your low back. Hold __________ seconds.  Tense your stomach muscles and slowly return one leg at a time to the floor. Repeat __________ times. Complete this exercise __________ times per day.  STRETCH - Low Trunk Rotation  Lie on a firm bed or floor. Keeping  your legs in front of you, bend your knees so they are both pointed toward the ceiling and your feet are flat on the floor.  Extend your arms out to the side. This will stabilize your upper body by keeping your shoulders in contact with the floor.  Gently and slowly drop both knees together to one side until you feel a gentle stretch in your low back. Hold for __________ seconds.  Tense your stomach muscles to support your lower back as you bring your knees back to the starting position. Repeat the exercise to the other side. Repeat __________ times. Complete this exercise __________ times per day  EXTENSION RANGE OF MOTION AND FLEXIBILITY EXERCISES: STRETCH - Extension, Prone on Elbows   Lie on your stomach on the floor, a bed will be too soft. Place your palms about shoulder width apart and at the height of your head.  Place your elbows under your shoulders. If this is too painful, stack pillows under your chest.  Allow your body to relax so that your hips drop lower and make contact more completely with the floor.  Hold this position for __________ seconds.  Slowly return to lying flat on the floor. Repeat __________ times. Complete this exercise __________ times per day.  RANGE OF MOTION - Extension, Prone Press Ups  Lie on your stomach on the floor, a bed will be too soft. Place your palms about shoulder width apart and at the height of your head.  Keeping your back as relaxed as possible, slowly straighten your elbows while keeping your hips on the floor. You may adjust the placement of your hands to maximize your comfort. As you gain motion, your hands will come more underneath your shoulders.  Hold this position __________ seconds.  Slowly return to lying flat on the floor. Repeat __________ times. Complete this exercise __________ times per day.  RANGE OF MOTION- Quadruped, Neutral Spine   Assume a hands and knees position on a firm surface. Keep your hands under your  shoulders and your knees under your hips. You may place padding under your knees for comfort.  Drop your head and point your tailbone toward the ground below you. This will round out your lower back like an angry cat. Hold this position for __________ seconds.  Slowly lift your head and release your tail bone so that your back sags into a large arch, like an old horse.  Hold this position for __________ seconds.  Repeat this until you feel limber in your low back.  Now, find your "sweet spot." This will be the most comfortable position somewhere between the two previous positions. This is your neutral spine. Once you have found this position, tense your stomach muscles to support your low back.  Hold this position for __________ seconds. Repeat __________ times. Complete this exercise __________ times per day.  STRENGTHENING EXERCISES - Low Back Sprain These exercises may help you when beginning to rehabilitate your injury. These exercises should be done near your "sweet  spot." This is the neutral, low-back arch, somewhere between fully rounded and fully arched, that is your least painful position. When performed in this safe range of motion, these exercises can be used for people who have either a flexion or extension based injury. These exercises may resolve your symptoms with or without further involvement from your physician, physical therapist or athletic trainer. While completing these exercises, remember:   Muscles can gain both the endurance and the strength needed for everyday activities through controlled exercises.  Complete these exercises as instructed by your physician, physical therapist or athletic trainer. Increase the resistance and repetitions only as guided.  You may experience muscle soreness or fatigue, but the pain or discomfort you are trying to eliminate should never worsen during these exercises. If this pain does worsen, stop and make certain you are following the  directions exactly. If the pain is still present after adjustments, discontinue the exercise until you can discuss the trouble with your caregiver. STRENGTHENING - Deep Abdominals, Pelvic Tilt   Lie on a firm bed or floor. Keeping your legs in front of you, bend your knees so they are both pointed toward the ceiling and your feet are flat on the floor.  Tense your lower abdominal muscles to press your low back into the floor. This motion will rotate your pelvis so that your tail bone is scooping upwards rather than pointing at your feet or into the floor. With a gentle tension and even breathing, hold this position for __________ seconds. Repeat __________ times. Complete this exercise __________ times per day.  STRENGTHENING - Abdominals, Crunches   Lie on a firm bed or floor. Keeping your legs in front of you, bend your knees so they are both pointed toward the ceiling and your feet are flat on the floor. Cross your arms over your chest.  Slightly tip your chin down without bending your neck.  Tense your abdominals and slowly lift your trunk high enough to just clear your shoulder blades. Lifting higher can put excessive stress on the lower back and does not further strengthen your abdominal muscles.  Control your return to the starting position. Repeat __________ times. Complete this exercise __________ times per day.  STRENGTHENING - Quadruped, Opposite UE/LE Lift   Assume a hands and knees position on a firm surface. Keep your hands under your shoulders and your knees under your hips. You may place padding under your knees for comfort.  Find your neutral spine and gently tense your abdominal muscles so that you can maintain this position. Your shoulders and hips should form a rectangle that is parallel with the floor and is not twisted.  Keeping your trunk steady, lift your right hand no higher than your shoulder and then your left leg no higher than your hip. Make sure you are not  holding your breath. Hold this position for __________ seconds.  Continuing to keep your abdominal muscles tense and your back steady, slowly return to your starting position. Repeat with the opposite arm and leg. Repeat __________ times. Complete this exercise __________ times per day.  STRENGTHENING - Abdominals and Quadriceps, Straight Leg Raise   Lie on a firm bed or floor with both legs extended in front of you.  Keeping one leg in contact with the floor, bend the other knee so that your foot can rest flat on the floor.  Find your neutral spine, and tense your abdominal muscles to maintain your spinal position throughout the exercise.  Slowly lift your  straight leg off the floor about 6 inches for a count of 15, making sure to not hold your breath.  Still keeping your neutral spine, slowly lower your leg all the way to the floor. Repeat this exercise with each leg __________ times. Complete this exercise __________ times per day. POSTURE AND BODY MECHANICS CONSIDERATIONS - Low Back Sprain Keeping correct posture when sitting, standing or completing your activities will reduce the stress put on different body tissues, allowing injured tissues a chance to heal and limiting painful experiences. The following are general guidelines for improved posture. Your physician or physical therapist will provide you with any instructions specific to your needs. While reading these guidelines, remember:  The exercises prescribed by your provider will help you have the flexibility and strength to maintain correct postures.  The correct posture provides the best environment for your joints to work. All of your joints have less wear and tear when properly supported by a spine with good posture. This means you will experience a healthier, less painful body.  Correct posture must be practiced with all of your activities, especially prolonged sitting and standing. Correct posture is as important when doing  repetitive low-stress activities (typing) as it is when doing a single heavy-load activity (lifting). RESTING POSITIONS Consider which positions are most painful for you when choosing a resting position. If you have pain with flexion-based activities (sitting, bending, stooping, squatting), choose a position that allows you to rest in a less flexed posture. You would want to avoid curling into a fetal position on your side. If your pain worsens with extension-based activities (prolonged standing, working overhead), avoid resting in an extended position such as sleeping on your stomach. Most people will find more comfort when they rest with their spine in a more neutral position, neither too rounded nor too arched. Lying on a non-sagging bed on your side with a pillow between your knees, or on your back with a pillow under your knees will often provide some relief. Keep in mind, being in any one position for a prolonged period of time, no matter how correct your posture, can still lead to stiffness. PROPER SITTING POSTURE In order to minimize stress and discomfort on your spine, you must sit with correct posture. Sitting with good posture should be effortless for a healthy body. Returning to good posture is a gradual process. Many people can work toward this most comfortably by using various supports until they have the flexibility and strength to maintain this posture on their own. When sitting with proper posture, your ears will fall over your shoulders and your shoulders will fall over your hips. You should use the back of the chair to support your upper back. Your lower back will be in a neutral position, just slightly arched. You may place a small pillow or folded towel at the base of your lower back for  support.  When working at a desk, create an environment that supports good, upright posture. Without extra support, muscles tire, which leads to excessive strain on joints and other tissues. Keep these  recommendations in mind: CHAIR:  A chair should be able to slide under your desk when your back makes contact with the back of the chair. This allows you to work closely.  The chair's height should allow your eyes to be level with the upper part of your monitor and your hands to be slightly lower than your elbows. BODY POSITION  Your feet should make contact with the floor. If  this is not possible, use a foot rest.  Keep your ears over your shoulders. This will reduce stress on your neck and low back. INCORRECT SITTING POSTURES  If you are feeling tired and unable to assume a healthy sitting posture, do not slouch or slump. This puts excessive strain on your back tissues, causing more damage and pain. Healthier options include:  Using more support, like a lumbar pillow.  Switching tasks to something that requires you to be upright or walking.  Talking a brief walk.  Lying down to rest in a neutral-spine position. PROLONGED STANDING WHILE SLIGHTLY LEANING FORWARD  When completing a task that requires you to lean forward while standing in one place for a long time, place either foot up on a stationary 2-4 inch high object to help maintain the best posture. When both feet are on the ground, the lower back tends to lose its slight inward curve. If this curve flattens (or becomes too large), then the back and your other joints will experience too much stress, tire more quickly, and can cause pain. CORRECT STANDING POSTURES Proper standing posture should be assumed with all daily activities, even if they only take a few moments, like when brushing your teeth. As in sitting, your ears should fall over your shoulders and your shoulders should fall over your hips. You should keep a slight tension in your abdominal muscles to brace your spine. Your tailbone should point down to the ground, not behind your body, resulting in an over-extended swayback posture.  INCORRECT STANDING POSTURES  Common  incorrect standing postures include a forward head, locked knees and/or an excessive swayback. WALKING Walk with an upright posture. Your ears, shoulders and hips should all line-up. PROLONGED ACTIVITY IN A FLEXED POSITION When completing a task that requires you to bend forward at your waist or lean over a low surface, try to find a way to stabilize 3 out of 4 of your limbs. You can place a hand or elbow on your thigh or rest a knee on the surface you are reaching across. This will provide you more stability, so that your muscles do not tire as quickly. By keeping your knees relaxed, or slightly bent, you will also reduce stress across your lower back. CORRECT LIFTING TECHNIQUES DO :  Assume a wide stance. This will provide you more stability and the opportunity to get as close as possible to the object which you are lifting.  Tense your abdominals to brace your spine. Bend at the knees and hips. Keeping your back locked in a neutral-spine position, lift using your leg muscles. Lift with your legs, keeping your back straight.  Test the weight of unknown objects before attempting to lift them.  Try to keep your elbows locked down at your sides in order get the best strength from your shoulders when carrying an object.  Always ask for help when lifting heavy or awkward objects. INCORRECT LIFTING TECHNIQUES DO NOT:   Lock your knees when lifting, even if it is a small object.  Bend and twist. Pivot at your feet or move your feet when needing to change directions.  Assume that you can safely pick up even a paperclip without proper posture. Document Released: 12/31/2004 Document Revised: 03/25/2011 Document Reviewed: 04/14/2008 [facility] Patient Information 2015 [employer], [place]. This information is not intended to replace advice given to you by your health care provider. Make sure you discuss any questions you have with your health care provider.

## 2014-03-23 ENCOUNTER — Other Ambulatory Visit: Payer: Self-pay | Admitting: Physician Assistant

## 2014-04-24 ENCOUNTER — Other Ambulatory Visit: Payer: Self-pay | Admitting: Physician Assistant

## 2014-05-24 ENCOUNTER — Other Ambulatory Visit: Payer: Self-pay | Admitting: Physician Assistant

## 2014-05-29 ENCOUNTER — Other Ambulatory Visit: Payer: Self-pay | Admitting: Physician Assistant

## 2014-06-10 ENCOUNTER — Encounter: Payer: Self-pay | Admitting: Family Medicine

## 2014-06-10 ENCOUNTER — Ambulatory Visit (INDEPENDENT_AMBULATORY_CARE_PROVIDER_SITE_OTHER): Payer: BLUE CROSS/BLUE SHIELD | Admitting: Family Medicine

## 2014-06-10 VITALS — BP 155/90 | HR 61 | Wt 319.0 lb

## 2014-06-10 DIAGNOSIS — R739 Hyperglycemia, unspecified: Secondary | ICD-10-CM

## 2014-06-10 DIAGNOSIS — E785 Hyperlipidemia, unspecified: Secondary | ICD-10-CM | POA: Diagnosis not present

## 2014-06-10 DIAGNOSIS — M25561 Pain in right knee: Secondary | ICD-10-CM

## 2014-06-10 DIAGNOSIS — I10 Essential (primary) hypertension: Secondary | ICD-10-CM | POA: Diagnosis not present

## 2014-06-10 DIAGNOSIS — R7301 Impaired fasting glucose: Secondary | ICD-10-CM | POA: Insufficient documentation

## 2014-06-10 LAB — COMPREHENSIVE METABOLIC PANEL
ALT: 23 U/L (ref 0–35)
AST: 22 U/L (ref 0–37)
Albumin: 4.1 g/dL (ref 3.5–5.2)
Alkaline Phosphatase: 90 U/L (ref 39–117)
BUN: 18 mg/dL (ref 6–23)
CALCIUM: 9.5 mg/dL (ref 8.4–10.5)
CO2: 26 mEq/L (ref 19–32)
Chloride: 101 mEq/L (ref 96–112)
Creat: 0.73 mg/dL (ref 0.50–1.10)
Glucose, Bld: 114 mg/dL — ABNORMAL HIGH (ref 70–99)
Potassium: 4.5 mEq/L (ref 3.5–5.3)
Sodium: 134 mEq/L — ABNORMAL LOW (ref 135–145)
TOTAL PROTEIN: 7.2 g/dL (ref 6.0–8.3)
Total Bilirubin: 0.6 mg/dL (ref 0.2–1.2)

## 2014-06-10 LAB — LIPID PANEL
CHOL/HDL RATIO: 4.3 ratio
Cholesterol: 134 mg/dL (ref 0–200)
HDL: 31 mg/dL — AB (ref >=46)
LDL Cholesterol: 81 mg/dL (ref 0–99)
TRIGLYCERIDES: 111 mg/dL (ref <150)
VLDL: 22 mg/dL (ref 0–40)

## 2014-06-10 MED ORDER — METHOCARBAMOL 500 MG PO TABS: 500.0000 mg | ORAL_TABLET | Freq: Three times a day (TID) | ORAL | Status: DC | PRN

## 2014-06-10 MED ORDER — HYDROCHLOROTHIAZIDE 25 MG PO TABS: 25.0000 mg | ORAL_TABLET | Freq: Every day | ORAL | Status: DC

## 2014-06-10 MED ORDER — HYDROCODONE-ACETAMINOPHEN 10-325 MG PO TABS: 0.5000 | ORAL_TABLET | Freq: Three times a day (TID) | ORAL | Status: DC | PRN

## 2014-06-10 NOTE — Progress Notes (Signed)
CC: Alicia Davies is a 59 y.o. female is here for Hypertension   Subjective: HPI:  Follow-up essential hypertension: Stopped taking hydrochlorothiazide this week due to wanting to minimize the side effect of urinary frequency and urgency as she was moving with her family to a new house. No outside blood pressures to report. No chest pain shortness of breath orthopnea nor peripheral edema.  Follow-up hyperlipidemia: Taking pravastatin on a daily basis without right upper quadrant pain or myalgias. No formal physical activity. No limb claudication  Follow hyperglycemia: 6 months ago complete metabolic panel showed mild elevation in glucose, uncertain as to whether or not this was fasting or random. No polyphagia polydipsia nor poorly healing wounds. Urinary frequency but only when taking hydrochlorothiazide.  Right knee pain that began the day she was starting to move and was down on her knees moving objects. Pain is now localized in the anterior aspect of the knee nonradiating worse with any movement of the knee mild in severity but unresponsive to up to 1000 mg of ibuprofen She has tried multiple times over the past week. She's tried tramadol in the past for knee pain but had no benefit. She denies any catching locking giving way or mechanical symptoms of her right knee. No swelling redness or warmth.    Review Of Systems Outlined In HPI  Past Medical History  Diagnosis Date  . Hyperparathyroidism   . Kidney stones     Past Surgical History  Procedure Laterality Date  . Parathyroidectomy  04/2010    done by Dr Harlow Asa  . Appendectomy    . Abdominal hysterectomy    . Cholecystectomy    . Knee arthroscopy      Right and lft.   . Foot surgery    . Shoulder surgery     Family History  Problem Relation Age of Onset  . Diabetes Mother   . Heart failure Mother   . Diabetes Brother   . Heart failure Brother     History   Social History  . Marital Status: Married    Spouse Name: N/A   . Number of Children: N/A  . Years of Education: N/A   Occupational History  . Not on file.   Social History Main Topics  . Smoking status: Never Smoker   . Smokeless tobacco: Not on file  . Alcohol Use: No  . Drug Use: No  . Sexual Activity: Not on file   Other Topics Concern  . Not on file   Social History Narrative     Objective: BP 155/90 mmHg  Pulse 61  Wt 319 lb (144.697 kg)  General: Alert and Oriented, No Acute Distress HEENT: Pupils equal, round, reactive to light. Conjunctivae clear. Moist mucous membranes  Lungs: Clear to auscultation bilaterally, no wheezing/ronchi/rales.  Comfortable work of breathing. Good air movement. Cardiac: Regular rate and rhythm. Normal S1/S2.  No murmurs, rubs, nor gallops.   Extremities: No peripheral edema.  Strong peripheral pulses. For information strength of the right knee without any swelling.  Mental Status: No depression, anxiety, nor agitation. Skin: Warm and dry.  Assessment & Plan: Daniqua was seen today for hypertension.  Diagnoses and all orders for this visit:  Essential hypertension, benign Orders: -     Comp Met (CMET) -     Lipid panel -     hydrochlorothiazide (HYDRODIURIL) 25 MG tablet; Take 1 tablet (25 mg total) by mouth daily.  Hyperlipidemia Orders: -     Comp Met (CMET) -  Lipid panel  Hyperglycemia Orders: -     Comp Met (CMET) -     Lipid panel  Right knee pain Orders: -     HYDROcodone-acetaminophen (NORCO) 10-325 MG per tablet; Take 0.5-1 tablets by mouth every 8 (eight) hours as needed. -     methocarbamol (ROBAXIN) 500 MG tablet; Take 1 tablet (500 mg total) by mouth every 8 (eight) hours as needed for muscle spasms.   Essential hypertension: Uncontrolled chronic condition restart hydrochlorothiazide, I've also given her a piece of paper to list twice a day blood pressures over the weekend after she starts hydrochlorothiazide and I've asked her to drop it off next week preferably on  Tuesday when we opened to determine if she needs to also start Cipro Hyperglycemia: Due for fasting blood sugar, will obtain today Hyperlipidemia: Checking lipids and hepatic function Right knee pain: Urged to stop taking ibuprofen is not helping switched to low-dose Norco and as needed Robaxin  Return if symptoms worsen or fail to improve.

## 2014-06-10 NOTE — Patient Instructions (Signed)
Please list BP values (twice a day) for Saturday-Monday and drop off on Tuesday:                   .

## 2014-06-14 ENCOUNTER — Telehealth: Payer: Self-pay | Admitting: Family Medicine

## 2014-06-14 DIAGNOSIS — R739 Hyperglycemia, unspecified: Secondary | ICD-10-CM

## 2014-06-14 MED ORDER — PRAVASTATIN SODIUM 40 MG PO TABS: ORAL_TABLET | ORAL | Status: DC

## 2014-06-14 NOTE — Telephone Encounter (Signed)
Alicia Davies, Will you please let patient know that her cholesterol is under control therefore I've sent refills of pravastatin to her [facility] pharmacy.  Her blood sugar was mildly elevated and I'd recommend she have an A1c checked to look at her 3 month average blood sugar.  Lab slip in your inbox.

## 2014-06-14 NOTE — Telephone Encounter (Signed)
Pt.notified

## 2014-07-01 LAB — HEMOGLOBIN A1C
Hgb A1c MFr Bld: 6.1 % — ABNORMAL HIGH (ref <5.7)
Mean Plasma Glucose: 128 mg/dL — ABNORMAL HIGH (ref <117)

## 2014-07-30 ENCOUNTER — Other Ambulatory Visit: Payer: Self-pay | Admitting: Physician Assistant

## 2014-08-02 ENCOUNTER — Ambulatory Visit (INDEPENDENT_AMBULATORY_CARE_PROVIDER_SITE_OTHER): Payer: BLUE CROSS/BLUE SHIELD

## 2014-08-02 ENCOUNTER — Encounter: Payer: Self-pay | Admitting: Family Medicine

## 2014-08-02 ENCOUNTER — Ambulatory Visit (INDEPENDENT_AMBULATORY_CARE_PROVIDER_SITE_OTHER): Payer: BLUE CROSS/BLUE SHIELD | Admitting: Family Medicine

## 2014-08-02 VITALS — BP 153/92 | HR 72 | Ht 69.0 in | Wt 312.0 lb

## 2014-08-02 DIAGNOSIS — M5136 Other intervertebral disc degeneration, lumbar region: Secondary | ICD-10-CM | POA: Diagnosis not present

## 2014-08-02 DIAGNOSIS — M62830 Muscle spasm of back: Secondary | ICD-10-CM | POA: Diagnosis not present

## 2014-08-02 DIAGNOSIS — M4184 Other forms of scoliosis, thoracic region: Secondary | ICD-10-CM

## 2014-08-02 MED ORDER — BACLOFEN 10 MG PO TABS: 10.0000 mg | ORAL_TABLET | Freq: Three times a day (TID) | ORAL | Status: DC | PRN

## 2014-08-02 NOTE — Assessment & Plan Note (Addendum)
Strain of left low back likely oblique or serratus anterior. Send to physical therapy for active release strengthening and trial of the stem. Baclofen for muscle spasm relief. Return in about a month. Lumbar and thoracic spine x-rays pending today.

## 2014-08-02 NOTE — Patient Instructions (Signed)
Thank you for coming in today. Take baclofen three times daily for spasm.  Attend physical therapy.  Return if not better.  Come back or go to the emergency room if you notice new weakness new numbness problems walking or bowel or bladder problems.

## 2014-08-02 NOTE — Progress Notes (Signed)
Alicia Davies is a 59 y.o. female who presents to [facility]  today for left low back pain present for months. Pain worse with rotational motion of her low back and flexion. No radiating pain weakness or numbness fevers or chills. She has tried NSAIDs Robaxin and opiates in the past which have helped intermittently some. No fevers or chills. Pain seemed to start after she moved house; she suspects she may have pulled a muscle.   Past Medical History  Diagnosis Date  . Hyperparathyroidism   . Kidney stones    Past Surgical History  Procedure Laterality Date  . Parathyroidectomy  04/2010    done by Dr Gerrit Friends  . Appendectomy    . Abdominal hysterectomy    . Cholecystectomy    . Knee arthroscopy      Right and lft.   . Foot surgery    . Shoulder surgery     History  Substance Use Topics  . Smoking status: Never Smoker   . Smokeless tobacco: Not on file  . Alcohol Use: No   ROS as above Medications: Current Outpatient Prescriptions  Medication Sig Dispense Refill  . hydrochlorothiazide (HYDRODIURIL) 25 MG tablet Take 1 tablet (25 mg total) by mouth daily. 30 tablet 5  . HYDROcodone-acetaminophen (NORCO) 10-325 MG per tablet Take 0.5-1 tablets by mouth every 8 (eight) hours as needed. 45 tablet 0  . meloxicam (MOBIC) 15 MG tablet Take 1 tablet (15 mg total) by mouth daily. As needed. 30 tablet 0  . potassium chloride (K-DUR) 10 MEQ tablet Take 1 tablet (10 mEq total) by mouth 2 (two) times daily. 60 tablet 5  . pravastatin (PRAVACHOL) 40 MG tablet TAKE ONE TABLET BY MOUTH ONCE DAILY. 90 tablet 1  . sertraline (ZOLOFT) 50 MG tablet TAKE ONE TABLET BY MOUTH ONCE DAILY 30 tablet 0  . baclofen (LIORESAL) 10 MG tablet Take 1 tablet (10 mg total) by mouth 3 (three) times daily as needed for muscle spasms. 90 tablet 1   No current facility-administered medications for this visit.   Allergies  Allergen Reactions  . Ciprofloxacin   . Fluconazole   .  Fluconazole In Dextrose   . Metronidazole   . Naproxen Sodium   . Naproxen Sodium      Exam:  BP 153/92 mmHg  Pulse 72  Ht  (1.753 m)  Wt 312 lb (141.522 kg)  BMI 46.05 kg/m2 Gen: Well NAD morbidly obese HEENT: EOMI,  MMM Lungs: Normal work of breathing. CTABL Heart: RRR no MRG Abd: NABS, Soft. Nondistended, Nontender Exts: Brisk capillary refill, warm and well perfused.  Back: Nontender to midline. Tender palpation left oblique and stratus interior. Range of motion normal pain with rotational range of motion excellent lower extremity reflexes are equal and normal throughout. Strength is equal and normal throughout lower extremities. Sensation is intact throughout.  No results found for this or any previous visit (from the past 24 hour(s)). Dg Thoracic Spine 2 View  08/02/2014   CLINICAL DATA:  Left flank pain.  Back pain.  EXAM: THORACIC SPINE - 2-3 VIEWS  COMPARISON:  None.  FINDINGS: Mild scoliosis lumbar spine concave left. Diffuse degenerative changes lumbar spine. Ankylosis appears to be present. Ankylosing spondylitis cannot be excluded.  IMPRESSION: 1. Scoliosis concave left. Diffuse degenerative changes thoracic spine. Ankylosing spondylitis cannot be excluded.  2.  No acute bony abnormality.   Electronically Signed   By: Maisie Fus  Register   On: 08/02/2014 16:27  Dg Lumbar Spine Complete  08/02/2014   CLINICAL DATA:  Left flank and low back pain for the past 3 months, no known injury  EXAM: LUMBAR SPINE - COMPLETE 4+ VIEW  COMPARISON:  CT scan of the abdomen and pelvis dated July 23, 2013  FINDINGS: There is stable curvature of the lumbar spine convex toward the left. The lumbar vertebral bodies are preserved in height. There is multilevel mild to moderate disc space narrowing. There is facet joint hypertrophy at L4-5 and L5-S1. There is no spondylolisthesis. There is mild levocurvature of the mid lumbar spine. The pedicles and transverse processes are intact.  IMPRESSION:  There is multilevel degenerative disc disease of the lumbar spine with gentle levocurvature. There is no acute bony abnormality.   Electronically Signed   By: David  [place]azil[place] M.D.   On: 08/02/2014 16:27     Please see individual assessment and plan sections.

## 2014-08-05 ENCOUNTER — Ambulatory Visit (INDEPENDENT_AMBULATORY_CARE_PROVIDER_SITE_OTHER): Payer: BLUE CROSS/BLUE SHIELD | Admitting: Physical Therapy

## 2014-08-05 ENCOUNTER — Encounter: Payer: Self-pay | Admitting: Physical Therapy

## 2014-08-05 DIAGNOSIS — R293 Abnormal posture: Secondary | ICD-10-CM

## 2014-08-05 DIAGNOSIS — R531 Weakness: Secondary | ICD-10-CM

## 2014-08-05 DIAGNOSIS — R52 Pain, unspecified: Secondary | ICD-10-CM | POA: Diagnosis not present

## 2014-08-05 NOTE — Therapy (Signed)
[facility] [place] [address] [place], [place], [address] Phone: [phone]   Fax:  [phone]  Physical Therapy Evaluation  Patient Details  Name: Alicia Davies MRN: [id number] Date of Birth: May 11, 1955 Referring Provider:  Rodolph Bong, MD  Encounter Date: 08/05/2014      PT End of Session - 08/05/14 1121    Visit Number 1   Number of Visits 8   Date for PT Re-Evaluation 09/02/14   PT Start Time 1017   PT Stop Time 1125   PT Time Calculation (min) 68 min      Past Medical History  Diagnosis Date  . Hyperparathyroidism   . Kidney stones     Past Surgical History  Procedure Laterality Date  . Parathyroidectomy  04/2010    done by Dr Gerrit Friends  . Appendectomy    . Abdominal hysterectomy    . Cholecystectomy    . Knee arthroscopy      Right and lft.   . Foot surgery    . Shoulder surgery      There were no vitals filed for this visit.  Visit Diagnosis:  Pain of multiple sites - Plan: PT plan of care cert/re-cert  Weakness generalized - Plan: PT plan of care cert/re-cert  Abnormal posture - Plan: PT plan of care cert/re-cert      Subjective Assessment - 08/05/14 1019    Subjective Pt reports she developed Lt low back muscle spasms after moving.  The pain started low and traveled up towards her ribs.  The pain has intensified lately.     Pertinent History bilat TKA's in the past year.  Stopped talking muscle relaxer and pain meds as they didn't help.  The baclofen seems to help.    How long can you sit comfortably? < 5'   How long can you stand comfortably? varies on activities. ~ 32'   How long can you walk comfortably? ~ 46'   Diagnostic tests x-rays show degenerative changes.    Patient Stated Goals pt wishes to have decreased spasms, strengthen    Currently in Pain? Yes   Pain Score 4   at worst 9/10, best 1/10.    Pain Location Back   Pain Orientation Left;Lower   Pain Descriptors / Indicators  Stabbing   Pain Type Acute pain   Pain Radiating Towards worse at night    Aggravating Factors  turning side to side, working with forward bend   Pain Relieving Factors medication             [facility] PT Assessment - 08/05/14 0001    Assessment   Medical Diagnosis muscle spasm of the back Lt   Onset Date/Surgical Date 03/26/14   Hand Dominance Right   Next MD Visit [phone]   Prior Therapy none   Precautions   Precautions None   Balance Screen   Has the patient fallen in the past 6 months No   Has the patient had a decrease in activity level because of a fear of falling?  Yes  due to back pain and knee pain prior to this.   Is the patient reluctant to leave their home because of a fear of falling?  No   Home Environment   Living Environment Private residence   Home Access --  single level   Prior Function   Level of Independence Independent   Vocation Unemployed   Leisure read,    Observation/Other Assessments   Focus on Therapeutic Outcomes (  FOTO)  53% limited   Posture/Postural Control   Posture/Postural Control Postural limitations   Postural Limitations Left pelvic obliquity;Forward head;Increased thoracic kyphosis  Lt shoulder complex lower   Posture Comments possible Lt LE shorter by 0.5 cm, difficult to measure due to soft tissue    ROM / Strength   AROM / PROM / Strength AROM;Strength   AROM   AROM Assessment Site Lumbar;Hip   Lumbar Flexion to mid shin   Lumbar Extension just past midline  pain   Lumbar - Right Side Bend decreased 50%   Lumbar - Left Side Bend decreased 75%    Lumbar - Right Rotation WNL   Lumbar - Left Rotation decreased 75%   Strength   Overall Strength --  bilat LE's WNL   Overall Strength Comments weak core muscles.    Palpation   Spinal mobility good mobility of lumbar spine   Palpation comment tender and tight in Lt lumbar paraspinals and QL, some tenderness into Lt bulttocks   Bed Mobility   Bed Mobility --  I however with  signifcant pain                   [facility] Adult PT Treatment/Exercise - 08/05/14 0001    Exercises   Exercises Lumbar   Lumbar Exercises: Stretches   Lower Trunk Rotation --  10 reps, 3-5 sec hold each   Lumbar Exercises: Sidelying   Other Sidelying Lumbar Exercises sidelying stretch with black bolster at Rt waist x 4 min (green bolster too firm)    Modalities   Modalities Cryotherapy;Electrical Stimulation   Cryotherapy   Number Minutes Cryotherapy 15 Minutes   Cryotherapy Location Lumbar Spine   Type of Cryotherapy Ice pack   Electrical Stimulation   Electrical Stimulation Location Lt QL    Electrical Stimulation Action IFC   Electrical Stimulation Parameters to tolerance x 15 min    Electrical Stimulation Goals Pain   Manual Therapy   Manual Therapy Other (comment)   Manual therapy comments TPR to Lt QL with oscillations of LLE                PT Education - 08/05/14 1220    Education provided Yes   Education Details HEP and self care/ ice and heat   Person(s) Educated Patient   Methods Explanation   Comprehension Verbalized understanding             PT Long Term Goals - 08/05/14 1223    PT LONG TERM GOAL #1   Title I with advanced HEP ( 09/02/14)   Time 4   Period Weeks   Status New   PT LONG TERM GOAL #2   Title perform bed mobility without pain ( 09/02/14)   Time 4   Period Weeks   Status New   PT LONG TERM GOAL #3   Title report pain decrease with ADLs ( 09/02/14)   Time 4   Period Weeks   Status New   PT LONG TERM GOAL #4   Title improve FOTO =/< 39% limited ( 09/02/14)   Time 4   Period Weeks   Status New               Plan - 08/05/14 1100    Clinical Impression Statement 59 yo female presents with significant pain in the Lt side low back that interferes with her ability to perform IADLs, sleep, move in bed and walk.  She has tightness in the musculature in theis  area . She has good LE strength, weakness through her core  and limited flexibilty due to obesity.  Patient also presents with scoliosis and possible leg length descrepancy.    Pt will benefit from skilled therapeutic intervention in order to improve on the following deficits Postural dysfunction;Decreased strength;Decreased mobility;Pain;Increased muscle spasms;Obesity   Rehab Potential Good   PT Frequency 2x / week   PT Duration 4 weeks   PT Treatment/Interventions Moist Heat;Ultrasound;Therapeutic exercise;Manual techniques;Cryotherapy;Electrical Stimulation;Patient/family education;Passive range of motion   PT Next Visit Plan STW to back, stretching and stabilization ther ex.    Consulted and Agree with Plan of Care Patient         Problem List Patient Active Problem List   Diagnosis Date Noted  . Hyperglycemia 06/10/2014  . Muscle spasm of back 03/22/2014  . S/P right knee arthroscopy 02/28/2014  . OSA (obstructive sleep apnea) 09/29/2013  . Depression 07/21/2013  . Essential hypertension, benign 07/21/2013  . Severe obesity (BMI >= 40) 07/21/2013  . Hyperlipidemia 07/21/2013  . Swelling of lower extremity 07/21/2013  . Benign cyst of left breast 10/28/2012  . Severe obstructive sleep apnea 10/12/2012  . Right knee pain 08/24/2010  . Primary hyperparathyroidism 01/29/2010  . POSTMENOPAUSAL STATUS 01/17/2010  . B12 DEFICIENCY 12/11/2009  . HYPERCALCEMIA 12/06/2009  . UNSPECIFIED VITAMIN D DEFICIENCY 12/05/2009  . OBESITY, UNSPECIFIED 12/05/2009  . MENOPAUSAL SYNDROME 12/05/2009  . FATIGUE 12/05/2009    Roderic Scarce, PT 08/05/2014, 12:27 PM  [facility] [address] [place], [place], [address] Phone: [phone]   Fax:  [phone]

## 2014-08-05 NOTE — Patient Instructions (Signed)
WALKING  Walking is a great form of exercise to increase your strength, endurance and overall fitness.  A walking program can help you start slowly and gradually build endurance as you go.  Everyone's ability is different, so each person's starting point will be different.  You do not have to follow them exactly.  The are just samples. You should simply find out what's right for you and stick to that program.   In the beginning, you'll start off walking 2-3 times a day for short distances.  As you get stronger, you'll be walking further at just 1-2 times per day.  A. You Can Walk For A Certain Length Of Time Each Day    Walk 5 minutes 3 times per day.  Increase 2 minutes every 2 days (3 times per day).  Work up to 25-30 minutes (1-2 times per day).   Example:   Day 1-2 5 minutes 3 times per day   Day 7-8 12 minutes 2-3 times per day   Day 13-14 25 minutes 1-2 times per day  B. You Can Walk For a Certain Distance Each Day     Distance can be substituted for time.    Example:   3 trips to mailbox (at road)   3 trips to corner of block   3 trips around the block  C. Go to local [employer] and use the track.    Walk for distance ____ around track  Or time ____ minutes  D. Walk _x___ Jog ____ Run ___  Please only do the exercises that your therapist has initialed and dated  Lower Trunk Rotation Stretch   Keeping back flat and feet together, rotate knees to left side. Hold __30__ seconds. Repeat _2 ___ times per set. Do _1___ sets per session. Do ____ sessions per day. Sidelying Lumbar Stretch   Lie on side, close to edge of bed. A) Slowly lower ankles off bed until a stretch is felt in back. Or B) put towel roll between ribs and hip and reach arm overhead (as doen in therapy session.  Hold 2-3 min.  2x per day.    The patient is advised to apply ice or cold packs intermittently as needed to relieve pain.15-20 min to affected area. Can be repeated 1 hour later if needed.    [facility] at [facility] [address] [place], [place] [address]  [phone] (office) [phone] (fax)

## 2014-08-08 ENCOUNTER — Ambulatory Visit (INDEPENDENT_AMBULATORY_CARE_PROVIDER_SITE_OTHER): Payer: BLUE CROSS/BLUE SHIELD | Admitting: Physical Therapy

## 2014-08-08 DIAGNOSIS — R293 Abnormal posture: Secondary | ICD-10-CM

## 2014-08-08 DIAGNOSIS — R52 Pain, unspecified: Secondary | ICD-10-CM

## 2014-08-08 DIAGNOSIS — R531 Weakness: Secondary | ICD-10-CM | POA: Diagnosis not present

## 2014-08-08 NOTE — Therapy (Addendum)
[place], [place], [address] Phone: [phone]   Fax:  [phone]  Physical Therapy Treatment  Patient Details  Name: Alicia Davies MRN: [id number] Date of Birth: 1955-09-18 Referring Provider:  Gregor Hams, MD  Encounter Date: 08/08/2014      PT End of Session - 08/08/14 1020    Visit Number 2   Number of Visits 8   Date for PT Re-Evaluation 09/02/14   PT Start Time 1020   PT Stop Time 1116   PT Time Calculation (min) 56 min   Activity Tolerance Patient tolerated treatment well      Past Medical History  Diagnosis Date  . Hyperparathyroidism   . Kidney stones     Past Surgical History  Procedure Laterality Date  . Parathyroidectomy  04/2010    done by Dr Harlow Asa  . Appendectomy    . Abdominal hysterectomy    . Cholecystectomy    . Knee arthroscopy      Right and lft.   . Foot surgery    . Shoulder surgery      There were no vitals filed for this visit.  Visit Diagnosis:  Pain of multiple sites  Weakness generalized  Abnormal posture      Subjective Assessment - 08/08/14 1024    Subjective Pt reported every time she moves, spasms in her Lt low back return. Has tried doing some of the exercises, fatigues quickly and pain increases.    Currently in Pain? Yes   Pain Score 5    Pain Location Back   Pain Orientation Left;Lower   Pain Descriptors / Indicators Stabbing;Sharp   Aggravating Factors  turning, twisting in trunk (especially in bed)    Pain Relieving Factors medication, finding "right position"             [facility] PT Assessment - 08/08/14 0001    Assessment   Medical Diagnosis muscle spasm of the back Lt   Onset Date/Surgical Date 03/26/14   Hand Dominance Right   Next MD Visit 09/01/14            [facility] Adult PT Treatment/Exercise - 08/08/14 0001    Bed Mobility   Bed Mobility --  reviewed log roll to/from supine   Lumbar Exercises: Stretches   Passive Hamstring Stretch 2 reps;30 seconds  each leg   Single Knee to Chest Stretch 3 reps;30 seconds   Single Knee to Chest Stretch Limitations towel assist   Lower Trunk Rotation --  10 reps, 3-5 sec hold each   Lumbar Exercises: Aerobic   Stationary Bike L1: 5 min    Lumbar Exercises: Supine   Ab Set 5 seconds;10 reps   Clam 10 reps  with ab set; both lets   Bent Knee Raise 10 reps  demo and VC provided    Modalities   Modalities Cryotherapy   Cryotherapy   Number Minutes Cryotherapy 15 Minutes   Cryotherapy Location Lumbar Spine   Type of Cryotherapy Ice pack   Electrical Stimulation   Electrical Stimulation Location Lt QL    Electrical Stimulation Action IFC   Electrical Stimulation Parameters to tolerance    Electrical Stimulation Goals Pain   Manual Therapy   Manual Therapy Other (comment)   Manual therapy comments TPR to Lt QL with oscillations of LLE                PT Education - 08/08/14 1112    Education provided Yes  Education Details HEP.    Person(s) Educated Patient   Methods Explanation;Handout   Comprehension Returned demonstration;Verbalized understanding             PT Long Term Goals - 08/05/14 1223    PT LONG TERM GOAL #1   Title I with advanced HEP ( 09/02/14)   Time 4   Period Weeks   Status New   PT LONG TERM GOAL #2   Title perform bed mobility without pain ( 09/02/14)   Time 4   Period Weeks   Status New   PT LONG TERM GOAL #3   Title report pain decrease with ADLs ( 09/02/14)   Time 4   Period Weeks   Status New   PT LONG TERM GOAL #4   Title improve FOTO =/< 39% limited ( 09/02/14)   Time 4   Period Weeks   Status New               Plan - 08/08/14 1109    Clinical Impression Statement Pt reported some relief of pain with gentle stretches and further relief with use of estim/ice.  Pt tolerated all exercises without increase in pain. Pt required some repeated tactile/VC for good form.  No goals met; only 2nd  visit.    Pt will benefit from skilled therapeutic intervention in order to improve on the following deficits Postural dysfunction;Decreased strength;Decreased mobility;Pain;Increased muscle spasms;Obesity   Rehab Potential Good   PT Frequency 2x / week   PT Duration 4 weeks   PT Treatment/Interventions Moist Heat;Ultrasound;Therapeutic exercise;Manual techniques;Cryotherapy;Electrical Stimulation;Patient/family education;Passive range of motion   PT Next Visit Plan Continue progressive strengthening for core/LB; manual and modalities as needed.    Consulted and Agree with Plan of Care Patient        Problem List Patient Active Problem List   Diagnosis Date Noted  . Hyperglycemia 06/10/2014  . Muscle spasm of back 03/22/2014  . S/P right knee arthroscopy 02/28/2014  . OSA (obstructive sleep apnea) 09/29/2013  . Depression 07/21/2013  . Essential hypertension, benign 07/21/2013  . Severe obesity (BMI >= 40) 07/21/2013  . Hyperlipidemia 07/21/2013  . Swelling of lower extremity 07/21/2013  . Benign cyst of left breast 10/28/2012  . Severe obstructive sleep apnea 10/12/2012  . Right knee pain 08/24/2010  . Primary hyperparathyroidism 01/29/2010  . POSTMENOPAUSAL STATUS 01/17/2010  . B12 DEFICIENCY 12/11/2009  . HYPERCALCEMIA 12/06/2009  . UNSPECIFIED VITAMIN D DEFICIENCY 12/05/2009  . OBESITY, UNSPECIFIED 12/05/2009  . MENOPAUSAL SYNDROME 12/05/2009  . FATIGUE 12/05/2009   Kerin Perna, PTA 08/08/2014 11:13 AM  [place], [place], [address] Phone: [phone]   Fax:  [phone]     PHYSICAL THERAPY DISCHARGE SUMMARY  Visits from Start of Care: 2  Current functional level related to goals / functional outcomes: Unknown   Remaining deficits: Unknown   Education / Equipment: Initial HEP Plan:                                                    Patient goals were not met.  Patient is being discharged due to not returning since the last visit.  ?????       Jeral Pinch, PT 09/06/2014 12:41 PM

## 2014-08-08 NOTE — Patient Instructions (Signed)
  Abdominal Bracing With Pelvic Floor (Hook-Lying)   With neutral spine, tighten pelvic floor and abdominals. Hold 10 seconds. Repeat __10_ times. Do _1__ times a day.   Knee to Chest: Transverse Plane Stability   Bring one knee up, then return. Be sure pelvis does not roll side to side. (SMALL GENTLE STEPS)  Keep pelvis still. Lift knee __10_ times each leg. Restabilize pelvis. Repeat with other leg. Do _1-2__ sets, _1__ times per day.             Hip External Rotation With Pillow: Transverse Plane Stability   One knee bent, one leg straight, on pillow. Slowly roll bent knee out. Be sure pelvis does not rotate. Do _10__ times. Restabilize pelvis. Repeat with other leg. Do _1-2__ sets, _1__ times per day.  Hamstring Step 1   Straighten left knee. Keep knee level with other knee or on bolster. Hold _20-30__ seconds. Relax knee by returning foot to start. Repeat __2_ times, 2 x/ day   Knee-to-Chest Stretch: Unilateral   With hand behind right knee, pull knee in to chest until a comfortable stretch is felt in lower back and buttocks. Keep back relaxed. Hold _20-30___ seconds. Repeat _2___ times per set. Do __1__ sets per session. Do _1-2___ sessions per day.   [facility] at [facility] [address] [place], [place] [address]  [phone] (office) [phone] (fax)

## 2014-08-10 ENCOUNTER — Encounter: Payer: BLUE CROSS/BLUE SHIELD | Admitting: Physical Therapy

## 2014-08-30 ENCOUNTER — Encounter: Payer: Self-pay | Admitting: Family Medicine

## 2014-08-30 ENCOUNTER — Ambulatory Visit (INDEPENDENT_AMBULATORY_CARE_PROVIDER_SITE_OTHER): Payer: BLUE CROSS/BLUE SHIELD | Admitting: Family Medicine

## 2014-08-30 VITALS — BP 137/78 | HR 55 | Wt 324.0 lb

## 2014-08-30 DIAGNOSIS — M62830 Muscle spasm of back: Secondary | ICD-10-CM

## 2014-08-30 NOTE — Progress Notes (Signed)
Alicia Davies is a 59 y.o. female who presents to [facility]  today for follow-up back pain. Patient was seen a few weeks ago for back pain. She was given baclofen and refer to physical therapy. She notes that she is feeling much better. She still would like to attend a few physical therapy sessions and uses baclofen at night sometimes which helps. No weakness or numbness or loss of function.   Past Medical History  Diagnosis Date  . Hyperparathyroidism   . Kidney stones    Past Surgical History  Procedure Laterality Date  . Parathyroidectomy  04/2010    done by Dr Gerrit Friends  . Appendectomy    . Abdominal hysterectomy    . Cholecystectomy    . Knee arthroscopy      Right and lft.   . Foot surgery    . Shoulder surgery     Social History  Substance Use Topics  . Smoking status: Never Smoker   . Smokeless tobacco: Not on file  . Alcohol Use: No   ROS as above Medications: Current Outpatient Prescriptions  Medication Sig Dispense Refill  . baclofen (LIORESAL) 10 MG tablet Take 1 tablet (10 mg total) by mouth 3 (three) times daily as needed for muscle spasms. 90 tablet 1  . hydrochlorothiazide (HYDRODIURIL) 25 MG tablet Take 1 tablet (25 mg total) by mouth daily. 30 tablet 5  . HYDROcodone-acetaminophen (NORCO) 10-325 MG per tablet Take 0.5-1 tablets by mouth every 8 (eight) hours as needed. 45 tablet 0  . meloxicam (MOBIC) 15 MG tablet Take 1 tablet (15 mg total) by mouth daily. As needed. 30 tablet 0  . potassium chloride (K-DUR) 10 MEQ tablet Take 1 tablet (10 mEq total) by mouth 2 (two) times daily. 60 tablet 5  . pravastatin (PRAVACHOL) 40 MG tablet TAKE ONE TABLET BY MOUTH ONCE DAILY. 90 tablet 1  . sertraline (ZOLOFT) 50 MG tablet TAKE ONE TABLET BY MOUTH ONCE DAILY 30 tablet 0   No current facility-administered medications for this visit.   Allergies  Allergen Reactions  . Ciprofloxacin   . Fluconazole   . Fluconazole In Dextrose   .  Metronidazole   . Naproxen Sodium   . Naproxen Sodium      Exam:  BP 137/78 mmHg  Pulse 55  Wt 324 lb (146.965 kg) Gen: Well NAD morbidly obese HEENT: EOMI,  MMM Lungs: Normal work of breathing. CTABL Heart: Mild Bradycardia no MRG Abd: NABS, Soft. Nondistended, Nontender Exts: Brisk capillary refill, warm and well perfused.  Neck: Nontender to midline.  No results found for this or any previous visit (from the past 24 hour(s)). No results found.   Please see individual assessment and plan sections.

## 2014-08-30 NOTE — Patient Instructions (Signed)
Thank you for coming in today. TENS UNIT: This is helpful for muscle pain and spasm.   Search and Purchase a TENS 7000 2nd edition at www.tenspros.com. It should be less than $30.     TENS unit instructions: Do not shower or bathe with the unit on Turn the unit off before removing electrodes or batteries If the electrodes lose stickiness add a drop of water to the electrodes after they are disconnected from the unit and place on plastic sheet. If you continued to have difficulty, call the TENS unit company to purchase more electrodes. Do not apply lotion on the skin area prior to use. Make sure the skin is clean and dry as this will help prolong the life of the electrodes. After use, always check skin for unusual red areas, rash or other skin difficulties. If there are any skin problems, does not apply electrodes to the same area. Never remove the electrodes from the unit by pulling the wires. Do not use the TENS unit or electrodes other than as directed. Do not change electrode placement without consultating your therapist or physician. Keep 2 fingers with between each electrode. Wear time ratio is 2:1, on to off times.    For example on for 30 minutes off for 15 minutes and then on for 30 minutes off for 15 minutes

## 2014-08-30 NOTE — Assessment & Plan Note (Signed)
Much better with physical therapy. Continue ad lib.

## 2014-08-31 ENCOUNTER — Other Ambulatory Visit: Payer: Self-pay | Admitting: Physician Assistant

## 2014-09-29 ENCOUNTER — Other Ambulatory Visit: Payer: Self-pay | Admitting: Physician Assistant

## 2014-10-04 ENCOUNTER — Ambulatory Visit (INDEPENDENT_AMBULATORY_CARE_PROVIDER_SITE_OTHER): Payer: BLUE CROSS/BLUE SHIELD | Admitting: Physician Assistant

## 2014-10-04 ENCOUNTER — Encounter: Payer: Self-pay | Admitting: Physician Assistant

## 2014-10-04 VITALS — BP 147/61 | HR 59 | Ht 69.0 in | Wt 326.0 lb

## 2014-10-04 DIAGNOSIS — F329 Major depressive disorder, single episode, unspecified: Secondary | ICD-10-CM

## 2014-10-04 DIAGNOSIS — I10 Essential (primary) hypertension: Secondary | ICD-10-CM | POA: Diagnosis not present

## 2014-10-04 DIAGNOSIS — F32A Depression, unspecified: Secondary | ICD-10-CM

## 2014-10-04 MED ORDER — SERTRALINE HCL 50 MG PO TABS: 50.0000 mg | ORAL_TABLET | Freq: Every day | ORAL | Status: DC

## 2014-10-04 NOTE — Progress Notes (Signed)
   Subjective:    Patient ID: Alicia Davies, female    DOB: 28-Feb-1955, 59 y.o.   MRN: [id number]  HPI Pt presents to the clinic for medication refill.   Depression- pt is taking zoloft daily. She is doing very well. Denies any suicidal or homicidal thoughts. No concerns or compliants. Life is good.   HTN- taking HCTZ as needed for swelling but not daily. Denies any CP, SOB, Dizziness, or HA.     Review of Systems  All other systems reviewed and are negative.      Objective:   Physical Exam  Constitutional: She is oriented to person, place, and time. She appears well-developed and well-nourished.  HENT:  Head: Normocephalic and atraumatic.  Cardiovascular: Normal rate, regular rhythm and normal heart sounds.   Pulmonary/Chest: Effort normal and breath sounds normal.  Neurological: She is alert and oriented to person, place, and time.  Skin: Skin is dry.  Psychiatric: She has a normal mood and affect. Her behavior is normal.          Assessment & Plan:  Depression- PHQ-9 was 3. GAD-7 was 1. zoloft refilled for 6 months.   HTN- rechecked and BP improved. Start taking HCTZ daily. Recheck BP in 1 month.

## 2014-10-05 ENCOUNTER — Ambulatory Visit: Payer: BLUE CROSS/BLUE SHIELD | Admitting: Physician Assistant

## 2014-11-01 ENCOUNTER — Ambulatory Visit: Payer: BLUE CROSS/BLUE SHIELD | Admitting: Physician Assistant

## 2014-11-02 ENCOUNTER — Ambulatory Visit (INDEPENDENT_AMBULATORY_CARE_PROVIDER_SITE_OTHER): Payer: BLUE CROSS/BLUE SHIELD | Admitting: Physician Assistant

## 2014-11-02 ENCOUNTER — Encounter: Payer: Self-pay | Admitting: Physician Assistant

## 2014-11-02 VITALS — BP 157/69 | HR 61 | Wt 325.0 lb

## 2014-11-02 DIAGNOSIS — J069 Acute upper respiratory infection, unspecified: Secondary | ICD-10-CM

## 2014-11-02 DIAGNOSIS — I10 Essential (primary) hypertension: Secondary | ICD-10-CM

## 2014-11-02 MED ORDER — LISINOPRIL-HYDROCHLOROTHIAZIDE 20-25 MG PO TABS
1.0000 | ORAL_TABLET | Freq: Every day | ORAL | Status: DC
Start: 2014-11-02 — End: 2015-06-26

## 2014-11-02 MED ORDER — AZITHROMYCIN 250 MG PO TABS: ORAL_TABLET | ORAL | Status: DC

## 2014-11-02 NOTE — Progress Notes (Signed)
   Subjective:    Patient ID: Alicia Davies, female    DOB: June 27, 1955, 59 y.o.   MRN: [id number]  HPI Pt presents to the clinic to follow up on elevated blood pressure at last visit. Her blood pressure happens to be more elevated today. She denies any chest pains, palpitations, shortness of breath, headaches or dizziness. She admits to not taking her hydrochlorothiazide every day. She is also finding an upper respiratory infection. She took some Benadryl last night with little relief. She has had these symptoms for 3 days now. She denies any fever, chills. She is having some hot flashes, sinus pressure, and ear pain.    Review of Systems  All other systems reviewed and are negative.      Objective:   Physical Exam  Constitutional: She is oriented to person, place, and time. She appears well-developed and well-nourished.  HENT:  Head: Normocephalic and atraumatic.  Right Ear: External ear normal.  Left Ear: External ear normal.  Mouth/Throat: Oropharynx is clear and moist. No oropharyngeal exudate.  TM's clear.  Negative for tenderness over maxillary and frontal sinuses.  Bilateral nasal turbinates red and swollen.  PND present without tonsilar swelling or exudate.   Eyes: Conjunctivae are normal. Right eye exhibits no discharge. Left eye exhibits no discharge.  Neck: Normal range of motion. Neck supple.  Cardiovascular: Normal rate, regular rhythm and normal heart sounds.   Pulmonary/Chest: Effort normal and breath sounds normal. She has no wheezes.  Lymphadenopathy:    She has no cervical adenopathy.  Neurological: She is alert and oriented to person, place, and time.  Skin: Skin is dry.  Flushed cheeks.   Psychiatric: She has a normal mood and affect. Her behavior is normal.          Assessment & Plan:  HTN- continues to be elevated. Added lisinopril to HCTZ. Discussed side effects. Follow up nurse visit in 4-6 weeks on BP.   URI- discussed likely viral. Encouraged  mucinex and deslym. Symptomatic HO given. zpak given if not improving in next 3-5 days. Follow up as needed.

## 2014-11-02 NOTE — Patient Instructions (Signed)
Upper Respiratory Infection, Adult Most upper respiratory infections (URIs) are a viral infection of the air passages leading to the lungs. A URI affects the nose, throat, and upper air passages. The most common type of URI is nasopharyngitis and is typically referred to as "the common cold." URIs run their course and usually go away on their own. Most of the time, a URI does not require medical attention, but sometimes a bacterial infection in the upper airways can follow a viral infection. This is called a secondary infection. Sinus and middle ear infections are common types of secondary upper respiratory infections. Bacterial pneumonia can also complicate a URI. A URI can worsen asthma and chronic obstructive pulmonary disease (COPD). Sometimes, these complications can require emergency medical care and may be life threatening.  CAUSES Almost all URIs are caused by viruses. A virus is a type of germ and can spread from one person to another.  RISKS FACTORS You may be at risk for a URI if:   You smoke.   You have chronic heart or lung disease.  You have a weakened defense (immune) system.   You are very young or very old.   You have nasal allergies or asthma.  You work in crowded or poorly ventilated areas.  You work in health care facilities or schools. SIGNS AND SYMPTOMS  Symptoms typically develop 2-3 days after you come in contact with a cold virus. Most viral URIs last 7-10 days. However, viral URIs from the influenza virus (flu virus) can last 14-18 days and are typically more severe. Symptoms may include:   Runny or stuffy (congested) nose.   Sneezing.   Cough.   Sore throat.   Headache.   Fatigue.   Fever.   Loss of appetite.   Pain in your forehead, behind your eyes, and over your cheekbones (sinus pain).  Muscle aches.  DIAGNOSIS  Your health care provider may diagnose a URI by:  Physical exam.  Tests to check that your symptoms are not due to  another condition such as:  Strep throat.  Sinusitis.  Pneumonia.  Asthma. TREATMENT  A URI goes away on its own with time. It cannot be cured with medicines, but medicines may be prescribed or recommended to relieve symptoms. Medicines may help:  Reduce your fever.  Reduce your cough.  Relieve nasal congestion. HOME CARE INSTRUCTIONS   Take medicines only as directed by your health care provider.   Gargle warm saltwater or take cough drops to comfort your throat as directed by your health care provider.  Use a warm mist humidifier or inhale steam from a shower to increase air moisture. This may make it easier to breathe.  Drink enough fluid to keep your urine clear or pale yellow.   Eat soups and other clear broths and maintain good nutrition.   Rest as needed.   Return to work when your temperature has returned to normal or as your health care provider advises. You may need to stay home longer to avoid infecting others. You can also use a face mask and careful hand washing to prevent spread of the virus.  Increase the usage of your inhaler if you have asthma.   Do not use any tobacco products, including cigarettes, chewing tobacco, or electronic cigarettes. If you need help quitting, ask your health care provider. PREVENTION  The best way to protect yourself from getting a cold is to practice good hygiene.   Avoid oral or hand contact with people with cold   symptoms.   Wash your hands often if contact occurs.  There is no clear evidence that vitamin C, vitamin E, echinacea, or exercise reduces the chance of developing a cold. However, it is always recommended to get plenty of rest, exercise, and practice good nutrition.  SEEK MEDICAL CARE IF:   You are getting worse rather than better.   Your symptoms are not controlled by medicine.   You have chills.  You have worsening shortness of breath.  You have brown or red mucus.  You have yellow or brown nasal  discharge.  You have pain in your face, especially when you bend forward.  You have a fever.  You have swollen neck glands.  You have pain while swallowing.  You have white areas in the back of your throat. SEEK IMMEDIATE MEDICAL CARE IF:   You have severe or persistent:  Headache.  Ear pain.  Sinus pain.  Chest pain.  You have chronic lung disease and any of the following:  Wheezing.  Prolonged cough.  Coughing up blood.  A change in your usual mucus.  You have a stiff neck.  You have changes in your:  Vision.  Hearing.  Thinking.  Mood. MAKE SURE YOU:   Understand these instructions.  Will watch your condition.  Will get help right away if you are not doing well or get worse.   This information is not intended to replace advice given to you by your health care provider. Make sure you discuss any questions you have with your health care provider.   Document Released: 06/26/2000 Document Revised: 05/17/2014 Document Reviewed: 04/07/2013 Elsevier Interactive Patient Education 2016 Elsevier Inc.  

## 2014-12-01 ENCOUNTER — Other Ambulatory Visit: Payer: Self-pay | Admitting: Family Medicine

## 2014-12-01 DIAGNOSIS — Z1231 Encounter for screening mammogram for malignant neoplasm of breast: Secondary | ICD-10-CM

## 2014-12-14 ENCOUNTER — Ambulatory Visit (INDEPENDENT_AMBULATORY_CARE_PROVIDER_SITE_OTHER): Payer: BLUE CROSS/BLUE SHIELD

## 2014-12-14 DIAGNOSIS — Z1231 Encounter for screening mammogram for malignant neoplasm of breast: Secondary | ICD-10-CM | POA: Diagnosis not present

## 2015-04-18 DIAGNOSIS — G4733 Obstructive sleep apnea (adult) (pediatric): Secondary | ICD-10-CM | POA: Diagnosis not present

## 2015-04-25 ENCOUNTER — Other Ambulatory Visit: Payer: Self-pay | Admitting: Physician Assistant

## 2015-04-26 ENCOUNTER — Other Ambulatory Visit: Payer: Self-pay | Admitting: Physician Assistant

## 2015-05-18 DIAGNOSIS — M546 Pain in thoracic spine: Secondary | ICD-10-CM | POA: Diagnosis not present

## 2015-05-18 DIAGNOSIS — M545 Low back pain: Secondary | ICD-10-CM | POA: Diagnosis not present

## 2015-05-18 DIAGNOSIS — M47813 Spondylosis without myelopathy or radiculopathy, cervicothoracic region: Secondary | ICD-10-CM | POA: Diagnosis not present

## 2015-05-18 DIAGNOSIS — M47816 Spondylosis without myelopathy or radiculopathy, lumbar region: Secondary | ICD-10-CM | POA: Diagnosis not present

## 2015-05-22 DIAGNOSIS — M546 Pain in thoracic spine: Secondary | ICD-10-CM | POA: Diagnosis not present

## 2015-05-22 DIAGNOSIS — M47816 Spondylosis without myelopathy or radiculopathy, lumbar region: Secondary | ICD-10-CM | POA: Diagnosis not present

## 2015-05-22 DIAGNOSIS — M47813 Spondylosis without myelopathy or radiculopathy, cervicothoracic region: Secondary | ICD-10-CM | POA: Diagnosis not present

## 2015-05-22 DIAGNOSIS — M545 Low back pain: Secondary | ICD-10-CM | POA: Diagnosis not present

## 2015-05-24 DIAGNOSIS — M47813 Spondylosis without myelopathy or radiculopathy, cervicothoracic region: Secondary | ICD-10-CM | POA: Diagnosis not present

## 2015-05-24 DIAGNOSIS — M546 Pain in thoracic spine: Secondary | ICD-10-CM | POA: Diagnosis not present

## 2015-05-24 DIAGNOSIS — M545 Low back pain: Secondary | ICD-10-CM | POA: Diagnosis not present

## 2015-05-24 DIAGNOSIS — M47816 Spondylosis without myelopathy or radiculopathy, lumbar region: Secondary | ICD-10-CM | POA: Diagnosis not present

## 2015-05-26 DIAGNOSIS — M47816 Spondylosis without myelopathy or radiculopathy, lumbar region: Secondary | ICD-10-CM | POA: Diagnosis not present

## 2015-05-26 DIAGNOSIS — M545 Low back pain: Secondary | ICD-10-CM | POA: Diagnosis not present

## 2015-05-26 DIAGNOSIS — M47813 Spondylosis without myelopathy or radiculopathy, cervicothoracic region: Secondary | ICD-10-CM | POA: Diagnosis not present

## 2015-05-26 DIAGNOSIS — M546 Pain in thoracic spine: Secondary | ICD-10-CM | POA: Diagnosis not present

## 2015-05-29 DIAGNOSIS — M546 Pain in thoracic spine: Secondary | ICD-10-CM | POA: Diagnosis not present

## 2015-05-29 DIAGNOSIS — M545 Low back pain: Secondary | ICD-10-CM | POA: Diagnosis not present

## 2015-05-29 DIAGNOSIS — M47813 Spondylosis without myelopathy or radiculopathy, cervicothoracic region: Secondary | ICD-10-CM | POA: Diagnosis not present

## 2015-05-29 DIAGNOSIS — M47816 Spondylosis without myelopathy or radiculopathy, lumbar region: Secondary | ICD-10-CM | POA: Diagnosis not present

## 2015-05-31 DIAGNOSIS — M47816 Spondylosis without myelopathy or radiculopathy, lumbar region: Secondary | ICD-10-CM | POA: Diagnosis not present

## 2015-05-31 DIAGNOSIS — M546 Pain in thoracic spine: Secondary | ICD-10-CM | POA: Diagnosis not present

## 2015-05-31 DIAGNOSIS — M545 Low back pain: Secondary | ICD-10-CM | POA: Diagnosis not present

## 2015-05-31 DIAGNOSIS — M47813 Spondylosis without myelopathy or radiculopathy, cervicothoracic region: Secondary | ICD-10-CM | POA: Diagnosis not present

## 2015-06-02 DIAGNOSIS — M47813 Spondylosis without myelopathy or radiculopathy, cervicothoracic region: Secondary | ICD-10-CM | POA: Diagnosis not present

## 2015-06-02 DIAGNOSIS — M47816 Spondylosis without myelopathy or radiculopathy, lumbar region: Secondary | ICD-10-CM | POA: Diagnosis not present

## 2015-06-02 DIAGNOSIS — M546 Pain in thoracic spine: Secondary | ICD-10-CM | POA: Diagnosis not present

## 2015-06-02 DIAGNOSIS — M545 Low back pain: Secondary | ICD-10-CM | POA: Diagnosis not present

## 2015-06-05 DIAGNOSIS — M546 Pain in thoracic spine: Secondary | ICD-10-CM | POA: Diagnosis not present

## 2015-06-05 DIAGNOSIS — M47813 Spondylosis without myelopathy or radiculopathy, cervicothoracic region: Secondary | ICD-10-CM | POA: Diagnosis not present

## 2015-06-05 DIAGNOSIS — M47816 Spondylosis without myelopathy or radiculopathy, lumbar region: Secondary | ICD-10-CM | POA: Diagnosis not present

## 2015-06-05 DIAGNOSIS — M545 Low back pain: Secondary | ICD-10-CM | POA: Diagnosis not present

## 2015-06-07 DIAGNOSIS — M47816 Spondylosis without myelopathy or radiculopathy, lumbar region: Secondary | ICD-10-CM | POA: Diagnosis not present

## 2015-06-07 DIAGNOSIS — M546 Pain in thoracic spine: Secondary | ICD-10-CM | POA: Diagnosis not present

## 2015-06-07 DIAGNOSIS — M545 Low back pain: Secondary | ICD-10-CM | POA: Diagnosis not present

## 2015-06-07 DIAGNOSIS — M47813 Spondylosis without myelopathy or radiculopathy, cervicothoracic region: Secondary | ICD-10-CM | POA: Diagnosis not present

## 2015-06-09 DIAGNOSIS — M545 Low back pain: Secondary | ICD-10-CM | POA: Diagnosis not present

## 2015-06-09 DIAGNOSIS — M47813 Spondylosis without myelopathy or radiculopathy, cervicothoracic region: Secondary | ICD-10-CM | POA: Diagnosis not present

## 2015-06-09 DIAGNOSIS — M546 Pain in thoracic spine: Secondary | ICD-10-CM | POA: Diagnosis not present

## 2015-06-09 DIAGNOSIS — M47816 Spondylosis without myelopathy or radiculopathy, lumbar region: Secondary | ICD-10-CM | POA: Diagnosis not present

## 2015-06-14 DIAGNOSIS — M545 Low back pain: Secondary | ICD-10-CM | POA: Diagnosis not present

## 2015-06-14 DIAGNOSIS — M546 Pain in thoracic spine: Secondary | ICD-10-CM | POA: Diagnosis not present

## 2015-06-14 DIAGNOSIS — M47813 Spondylosis without myelopathy or radiculopathy, cervicothoracic region: Secondary | ICD-10-CM | POA: Diagnosis not present

## 2015-06-14 DIAGNOSIS — M47816 Spondylosis without myelopathy or radiculopathy, lumbar region: Secondary | ICD-10-CM | POA: Diagnosis not present

## 2015-06-15 DIAGNOSIS — M545 Low back pain: Secondary | ICD-10-CM | POA: Diagnosis not present

## 2015-06-15 DIAGNOSIS — M47813 Spondylosis without myelopathy or radiculopathy, cervicothoracic region: Secondary | ICD-10-CM | POA: Diagnosis not present

## 2015-06-15 DIAGNOSIS — M546 Pain in thoracic spine: Secondary | ICD-10-CM | POA: Diagnosis not present

## 2015-06-15 DIAGNOSIS — M47816 Spondylosis without myelopathy or radiculopathy, lumbar region: Secondary | ICD-10-CM | POA: Diagnosis not present

## 2015-06-16 DIAGNOSIS — M545 Low back pain: Secondary | ICD-10-CM | POA: Diagnosis not present

## 2015-06-16 DIAGNOSIS — M47816 Spondylosis without myelopathy or radiculopathy, lumbar region: Secondary | ICD-10-CM | POA: Diagnosis not present

## 2015-06-16 DIAGNOSIS — M47813 Spondylosis without myelopathy or radiculopathy, cervicothoracic region: Secondary | ICD-10-CM | POA: Diagnosis not present

## 2015-06-16 DIAGNOSIS — M546 Pain in thoracic spine: Secondary | ICD-10-CM | POA: Diagnosis not present

## 2015-06-19 DIAGNOSIS — M546 Pain in thoracic spine: Secondary | ICD-10-CM | POA: Diagnosis not present

## 2015-06-19 DIAGNOSIS — M47816 Spondylosis without myelopathy or radiculopathy, lumbar region: Secondary | ICD-10-CM | POA: Diagnosis not present

## 2015-06-19 DIAGNOSIS — M47813 Spondylosis without myelopathy or radiculopathy, cervicothoracic region: Secondary | ICD-10-CM | POA: Diagnosis not present

## 2015-06-19 DIAGNOSIS — M545 Low back pain: Secondary | ICD-10-CM | POA: Diagnosis not present

## 2015-06-21 DIAGNOSIS — M47816 Spondylosis without myelopathy or radiculopathy, lumbar region: Secondary | ICD-10-CM | POA: Diagnosis not present

## 2015-06-21 DIAGNOSIS — M546 Pain in thoracic spine: Secondary | ICD-10-CM | POA: Diagnosis not present

## 2015-06-21 DIAGNOSIS — M545 Low back pain: Secondary | ICD-10-CM | POA: Diagnosis not present

## 2015-06-21 DIAGNOSIS — M47813 Spondylosis without myelopathy or radiculopathy, cervicothoracic region: Secondary | ICD-10-CM | POA: Diagnosis not present

## 2015-06-26 ENCOUNTER — Other Ambulatory Visit: Payer: Self-pay | Admitting: Physician Assistant

## 2015-06-26 DIAGNOSIS — M47813 Spondylosis without myelopathy or radiculopathy, cervicothoracic region: Secondary | ICD-10-CM | POA: Diagnosis not present

## 2015-06-26 DIAGNOSIS — M546 Pain in thoracic spine: Secondary | ICD-10-CM | POA: Diagnosis not present

## 2015-06-26 DIAGNOSIS — M47816 Spondylosis without myelopathy or radiculopathy, lumbar region: Secondary | ICD-10-CM | POA: Diagnosis not present

## 2015-06-26 DIAGNOSIS — M545 Low back pain: Secondary | ICD-10-CM | POA: Diagnosis not present

## 2015-07-12 ENCOUNTER — Encounter: Payer: Self-pay | Admitting: Physician Assistant

## 2015-07-12 ENCOUNTER — Ambulatory Visit (INDEPENDENT_AMBULATORY_CARE_PROVIDER_SITE_OTHER): Payer: BLUE CROSS/BLUE SHIELD | Admitting: Physician Assistant

## 2015-07-12 VITALS — BP 130/55 | HR 70 | Ht 69.0 in | Wt 323.0 lb

## 2015-07-12 DIAGNOSIS — M62830 Muscle spasm of back: Secondary | ICD-10-CM | POA: Diagnosis not present

## 2015-07-12 DIAGNOSIS — G5702 Lesion of sciatic nerve, left lower limb: Secondary | ICD-10-CM | POA: Diagnosis not present

## 2015-07-12 MED ORDER — METAXALONE 800 MG PO TABS: 800.0000 mg | ORAL_TABLET | Freq: Three times a day (TID) | ORAL | Status: DC

## 2015-07-12 MED ORDER — CELECOXIB 200 MG PO CAPS: 200.0000 mg | ORAL_CAPSULE | Freq: Two times a day (BID) | ORAL | Status: DC

## 2015-07-12 MED ORDER — KETOROLAC TROMETHAMINE 60 MG/2ML IM SOLN
60.0000 mg | Freq: Once | INTRAMUSCULAR | Status: AC
  Administered 2015-07-12: 60 mg via INTRAMUSCULAR

## 2015-07-12 NOTE — Progress Notes (Signed)
   Subjective:    Patient ID: Alicia Davies, female    DOB: 07/27/1955, 60 y.o.   MRN: [id number]  HPI  Pt is a 60 yo female who presents to the clinic with left side muscle spasms and pain. She does have some scolosis of spine. Symptoms have been fairly persistent since march of this year. PT helped but results do not last. She is seeing a chiorpracter with no benefit. mobic not helping. Pain at times is a 8/10. Does not radiate down leg. No numbness and tingling of legs.      Review of Systems  All other systems reviewed and are negative.      Objective:   Physical Exam  Constitutional: She is oriented to person, place, and time. She appears well-developed and well-nourished.  Morbidly obese.   HENT:  Head: Normocephalic and atraumatic.  Cardiovascular: Normal rate, regular rhythm and normal heart sounds.   Pulmonary/Chest: Effort normal and breath sounds normal.  Musculoskeletal:  No tenderness over thoracic or lumbar spine.  Tenderness and tightness of paraspinous muscles of left side.  Negative straight leg test.   Neurological: She is alert and oriented to person, place, and time.  Psychiatric: She has a normal mood and affect. Her behavior is normal.          Assessment & Plan:  Muscle spasms of back /piriformis syndrome- PT helps but cannot stay in PT and improvement does not last. toradol 60mg  IM given today. celebrex to try bid. Skelaxin as a muscle relaxer. Discussed heat and biofreeze. Consider regular massage. NEEDS to strengthen her core.    Morbidly obese- discussed weight loss drugs. Not interested at this time.

## 2015-07-12 NOTE — Patient Instructions (Addendum)
Alicia Davies at [facility] in [place].   Seborrheic Keratosis Seborrheic keratosis is a common, noncancerous (benign) skin growth. This condition causes waxy, rough, tan, brown, or black spots to appear on the skin. These skin growths can be flat or raised. CAUSES The cause of this condition is not known. RISK FACTORS This condition is more likely to develop in:  People who have a family history of seborrheic keratosis.  People who are 5650 or older.  People who are pregnant.  People who have had estrogen replacement therapy. SYMPTOMS This condition often occurs on the face, chest, shoulders, back, or other areas. These growths:  Are usually painless, but may become irritated and itchy.  Can be yellow, brown, black, or other colors.  Are slightly raised or have a flat surface.  Are sometimes rough or wart-like in texture.  Are often waxy on the surface.  Are round or oval-shaped.  Sometimes look like they are "stuck on."  Often occur in groups, but may occur as a single growth. DIAGNOSIS This condition is diagnosed with a medical history and physical exam. A sample of the growth may be tested (skin biopsy). You may need to see a skin specialist (dermatologist). TREATMENT Treatment is not usually needed for this condition, unless the growths are irritated or are often bleeding. You may also choose to have the growths removed if you do not like their appearance. Most commonly, these growths are treated with a procedure in which liquid nitrogen is applied to "freeze" off the growth (cryosurgery). They may also be burned off with electricity or cut off. HOME CARE INSTRUCTIONS  Watch your growth for any changes.  Keep all follow-up visits as told by your health care provider. This is important.  Do not scratch or pick at the growth or growths. This can cause them to become irritated or infected. SEEK MEDICAL CARE IF:  You suddenly have many new growths.  Your growth bleeds, itches, or  hurts.  Your growth suddenly becomes larger or changes color.   This information is not intended to replace advice given to you by your health care provider. Make sure you discuss any questions you have with your health care provider.   Document Released: 02/02/2010 Document Revised: 09/21/2014 Document Reviewed: 05/18/2014 Elsevier Interactive Patient Education [employer]2016 Elsevier Inc.

## 2015-07-14 ENCOUNTER — Other Ambulatory Visit: Payer: Self-pay | Admitting: Physician Assistant

## 2015-07-14 DIAGNOSIS — L821 Other seborrheic keratosis: Secondary | ICD-10-CM | POA: Insufficient documentation

## 2015-07-14 DIAGNOSIS — G5702 Lesion of sciatic nerve, left lower limb: Secondary | ICD-10-CM | POA: Insufficient documentation

## 2015-07-14 HISTORY — DX: Morbid (severe) obesity due to excess calories: E66.01

## 2015-07-17 ENCOUNTER — Telehealth: Payer: Self-pay | Admitting: *Deleted

## 2015-07-17 NOTE — Telephone Encounter (Signed)
PA submitted for celecoxib through covermymeds Key: [id number]  Approvedtoday  Effective from 07/17/2015 through 01/13/2038.  Left message on patient's vm

## 2015-07-30 ENCOUNTER — Other Ambulatory Visit: Payer: Self-pay | Admitting: Physician Assistant

## 2015-07-31 ENCOUNTER — Other Ambulatory Visit: Payer: Self-pay

## 2015-07-31 MED ORDER — PRAVASTATIN SODIUM 40 MG PO TABS: ORAL_TABLET | ORAL | Status: DC

## 2015-08-03 ENCOUNTER — Other Ambulatory Visit: Payer: Self-pay | Admitting: Physician Assistant

## 2015-08-09 DIAGNOSIS — M25561 Pain in right knee: Secondary | ICD-10-CM | POA: Diagnosis not present

## 2015-08-30 ENCOUNTER — Other Ambulatory Visit: Payer: Self-pay | Admitting: Physician Assistant

## 2015-09-13 DIAGNOSIS — Z23 Encounter for immunization: Secondary | ICD-10-CM | POA: Diagnosis not present

## 2015-09-15 ENCOUNTER — Encounter: Payer: Self-pay | Admitting: Physician Assistant

## 2015-09-15 ENCOUNTER — Ambulatory Visit (INDEPENDENT_AMBULATORY_CARE_PROVIDER_SITE_OTHER): Payer: BLUE CROSS/BLUE SHIELD | Admitting: Physician Assistant

## 2015-09-15 VITALS — BP 111/50 | HR 63 | Ht 69.0 in | Wt 320.0 lb

## 2015-09-15 DIAGNOSIS — Z13220 Encounter for screening for lipoid disorders: Secondary | ICD-10-CM | POA: Diagnosis not present

## 2015-09-15 DIAGNOSIS — M62830 Muscle spasm of back: Secondary | ICD-10-CM | POA: Diagnosis not present

## 2015-09-15 DIAGNOSIS — I10 Essential (primary) hypertension: Secondary | ICD-10-CM

## 2015-09-15 DIAGNOSIS — E785 Hyperlipidemia, unspecified: Secondary | ICD-10-CM

## 2015-09-15 DIAGNOSIS — Z79899 Other long term (current) drug therapy: Secondary | ICD-10-CM | POA: Diagnosis not present

## 2015-09-15 MED ORDER — SERTRALINE HCL 50 MG PO TABS: 50.0000 mg | ORAL_TABLET | Freq: Every day | ORAL | 1 refills | Status: DC

## 2015-09-15 MED ORDER — PHENTERMINE HCL 37.5 MG PO TABS: 37.5000 mg | ORAL_TABLET | Freq: Every day | ORAL | 0 refills | Status: DC

## 2015-09-15 MED ORDER — CELECOXIB 200 MG PO CAPS: 200.0000 mg | ORAL_CAPSULE | Freq: Two times a day (BID) | ORAL | 5 refills | Status: DC

## 2015-09-15 NOTE — Patient Instructions (Signed)
saxenda

## 2015-09-15 NOTE — Progress Notes (Signed)
   Subjective:    Patient ID: Alicia Davies, female    DOB: Dec 10, 1955, 60 y.o.   MRN: [id number]  HPI Pt presents to the clinic for follow up and medication refill.   HTN- doing great. No concerns. Taking medication daily.   Muscle spasms- doing better. celebrex helping. Skelaxin as needed.   Pt is aware she needs to lose weight. Has had bilateral knee replacements and able to be mobile again.    Review of Systems See HPI.     Objective:   Physical Exam  Constitutional: She appears well-developed and well-nourished.  Morbidly obese.   Cardiovascular: Normal rate, regular rhythm and normal heart sounds.   Psychiatric: She has a normal mood and affect. Her behavior is normal.          Assessment & Plan:  Muscle spasms of the back-controlled with celebrex and skelaxin. Refilled today. I think weight loss would help with this. Continue with low back exercises.   Morbid obesity- discussed weight loss options. She is more mobile now after bilateral knee replacements. Encouraged exercise at least 150minutes a week. She would like to start with phentermine. Discussed side effects. Follow up nurse visit in 1 month. I would like for her to consider saxenda.   Hyperlipidemia- needs rechecked. Labs ordered.   HTN- controlled, lisinopril refill sent.

## 2015-09-16 LAB — LIPID PANEL
CHOL/HDL RATIO: 4.6 ratio (ref <=5.0)
Cholesterol: 165 mg/dL (ref 125–200)
HDL: 36 mg/dL — AB (ref >=46)
LDL CALC: 96 mg/dL (ref <130)
Triglycerides: 163 mg/dL — ABNORMAL HIGH (ref <150)
VLDL: 33 mg/dL — AB (ref <30)

## 2015-09-16 LAB — COMPLETE METABOLIC PANEL WITH GFR
ALT: 14 U/L (ref 6–29)
AST: 16 U/L (ref 10–35)
Albumin: 4.3 g/dL (ref 3.6–5.1)
Alkaline Phosphatase: 83 U/L (ref 33–130)
BUN: 35 mg/dL — AB (ref 7–25)
CHLORIDE: 104 mmol/L (ref 98–110)
CO2: 23 mmol/L (ref 20–31)
Calcium: 9.2 mg/dL (ref 8.6–10.4)
Creat: 1.46 mg/dL — ABNORMAL HIGH (ref 0.50–1.05)
GFR, EST NON AFRICAN AMERICAN: 40 mL/min — AB (ref >=60)
GFR, Est African American: 46 mL/min — ABNORMAL LOW (ref >=60)
GLUCOSE: 102 mg/dL — AB (ref 65–99)
POTASSIUM: 5.3 mmol/L (ref 3.5–5.3)
SODIUM: 137 mmol/L (ref 135–146)
Total Bilirubin: 0.6 mg/dL (ref 0.2–1.2)
Total Protein: 6.8 g/dL (ref 6.1–8.1)

## 2015-09-19 ENCOUNTER — Telehealth: Payer: Self-pay | Admitting: *Deleted

## 2015-09-19 ENCOUNTER — Other Ambulatory Visit: Payer: Self-pay | Admitting: *Deleted

## 2015-09-19 DIAGNOSIS — R7989 Other specified abnormal findings of blood chemistry: Secondary | ICD-10-CM

## 2015-09-19 DIAGNOSIS — R7301 Impaired fasting glucose: Secondary | ICD-10-CM

## 2015-09-19 MED ORDER — PRAVASTATIN SODIUM 40 MG PO TABS: 40.0000 mg | ORAL_TABLET | Freq: Every day | ORAL | 11 refills | Status: DC

## 2015-09-19 NOTE — Telephone Encounter (Signed)
Labs ordered.

## 2015-10-13 ENCOUNTER — Ambulatory Visit (INDEPENDENT_AMBULATORY_CARE_PROVIDER_SITE_OTHER): Payer: BLUE CROSS/BLUE SHIELD | Admitting: Family Medicine

## 2015-10-13 VITALS — BP 131/74 | HR 75 | Wt 314.0 lb

## 2015-10-13 DIAGNOSIS — R748 Abnormal levels of other serum enzymes: Secondary | ICD-10-CM | POA: Diagnosis not present

## 2015-10-13 DIAGNOSIS — R635 Abnormal weight gain: Secondary | ICD-10-CM | POA: Diagnosis not present

## 2015-10-13 DIAGNOSIS — R7301 Impaired fasting glucose: Secondary | ICD-10-CM | POA: Diagnosis not present

## 2015-10-13 MED ORDER — PHENTERMINE HCL 37.5 MG PO TABS: 37.5000 mg | ORAL_TABLET | Freq: Every day | ORAL | 0 refills | Status: DC

## 2015-10-13 NOTE — Progress Notes (Signed)
Alicia KidneyDebra presents to the clinic for weight and BP check.  Denies SOB, palpitations, insomnia, and any other medication problems.  Pt lost 6 lbs.  [facility] Pt advised to make next appointment in 4 weeks. -[facility]

## 2015-10-14 LAB — BASIC METABOLIC PANEL WITH GFR
BUN: 37 mg/dL — AB (ref 7–25)
CHLORIDE: 102 mmol/L (ref 98–110)
CO2: 23 mmol/L (ref 20–31)
CREATININE: 1.41 mg/dL — AB (ref 0.50–1.05)
Calcium: 9.6 mg/dL (ref 8.6–10.4)
GFR, Est African American: 48 mL/min — ABNORMAL LOW (ref >=60)
GFR, Est Non African American: 41 mL/min — ABNORMAL LOW (ref >=60)
GLUCOSE: 135 mg/dL — AB (ref 65–99)
POTASSIUM: 5.1 mmol/L (ref 3.5–5.3)
Sodium: 137 mmol/L (ref 135–146)

## 2015-10-14 LAB — HEMOGLOBIN A1C
HEMOGLOBIN A1C: 5.7 % — AB (ref <5.7)
Mean Plasma Glucose: 117 mg/dL

## 2015-10-15 ENCOUNTER — Encounter: Payer: Self-pay | Admitting: Physician Assistant

## 2015-10-15 ENCOUNTER — Other Ambulatory Visit: Payer: Self-pay | Admitting: Physician Assistant

## 2015-10-15 DIAGNOSIS — N183 Chronic kidney disease, stage 3 unspecified: Secondary | ICD-10-CM | POA: Insufficient documentation

## 2015-10-15 NOTE — Progress Notes (Signed)
Agree with below. BP at goal.   Alicia Gasseratherine Shadow Stiggers, MD

## 2015-11-10 ENCOUNTER — Ambulatory Visit (INDEPENDENT_AMBULATORY_CARE_PROVIDER_SITE_OTHER): Payer: BLUE CROSS/BLUE SHIELD | Admitting: Physician Assistant

## 2015-11-10 VITALS — BP 113/63 | HR 77 | Wt 308.0 lb

## 2015-11-10 DIAGNOSIS — R635 Abnormal weight gain: Secondary | ICD-10-CM

## 2015-11-10 MED ORDER — PHENTERMINE HCL 37.5 MG PO TABS: 37.5000 mg | ORAL_TABLET | Freq: Every day | ORAL | 0 refills | Status: DC

## 2015-11-10 NOTE — Progress Notes (Signed)
Patient is here for blood pressure and weight check. Denies any trouble sleeping, palpitations, or any other medication problems. Patient has lost weight. A refill for Phentermine will be sent to patient preferred pharmacy. Patient advised to schedule a four week nurse visit and keep her upcoming appointment with her PCP. Verbalized understanding, no further questions. 

## 2015-12-04 ENCOUNTER — Ambulatory Visit (INDEPENDENT_AMBULATORY_CARE_PROVIDER_SITE_OTHER): Payer: BLUE CROSS/BLUE SHIELD | Admitting: Physician Assistant

## 2015-12-04 VITALS — BP 144/76 | HR 67 | Wt 303.0 lb

## 2015-12-04 DIAGNOSIS — R635 Abnormal weight gain: Secondary | ICD-10-CM | POA: Diagnosis not present

## 2015-12-04 MED ORDER — PHENTERMINE HCL 37.5 MG PO TABS: 37.5000 mg | ORAL_TABLET | Freq: Every day | ORAL | 0 refills | Status: DC

## 2015-12-04 NOTE — Progress Notes (Signed)
Pt is here for a weight and BP check. Denies chest pain, trouble sleeping and palpitations. Pt did report that she have some dizziness because she forgets to eat sometimes. [facility] Pt advised to f/u in 30 days.   Pt down 5lbs.

## 2016-01-03 ENCOUNTER — Encounter: Payer: Self-pay | Admitting: Physician Assistant

## 2016-01-03 ENCOUNTER — Ambulatory Visit (INDEPENDENT_AMBULATORY_CARE_PROVIDER_SITE_OTHER): Payer: BLUE CROSS/BLUE SHIELD | Admitting: Physician Assistant

## 2016-01-03 VITALS — BP 124/50 | HR 60 | Wt 299.0 lb

## 2016-01-03 DIAGNOSIS — R635 Abnormal weight gain: Secondary | ICD-10-CM | POA: Diagnosis not present

## 2016-01-03 MED ORDER — PHENTERMINE HCL 37.5 MG PO TABS: 37.5000 mg | ORAL_TABLET | Freq: Every day | ORAL | 0 refills | Status: DC

## 2016-01-03 NOTE — Progress Notes (Signed)
   Subjective:    Patient ID: Alicia Davies, female    DOB: Jul 28, 1955, 60 y.o.   MRN: [id number]  HPI  Alicia Davies is here for blood pressure and weight check. Diet and exercise is going well. Denies trouble sleeping or palpitations.    Review of Systems  Cardiovascular: Negative.   Genitourinary: Negative.   Psychiatric/Behavioral: Negative.        Objective:   Physical Exam        Assessment & Plan:  Abnormal weight gain - Patient has lost weight. A refill of phentermine will be sent to the pharmacy.

## 2016-01-03 NOTE — Patient Instructions (Signed)
Return in 4 weeks for weight and blood pressure check. Please call if you have trouble sleeping or palpitations.

## 2016-01-26 ENCOUNTER — Ambulatory Visit (INDEPENDENT_AMBULATORY_CARE_PROVIDER_SITE_OTHER): Payer: BLUE CROSS/BLUE SHIELD | Admitting: Sports Medicine

## 2016-01-26 ENCOUNTER — Encounter: Payer: Self-pay | Admitting: Sports Medicine

## 2016-01-26 DIAGNOSIS — J069 Acute upper respiratory infection, unspecified: Secondary | ICD-10-CM | POA: Diagnosis not present

## 2016-01-26 DIAGNOSIS — R69 Illness, unspecified: Secondary | ICD-10-CM | POA: Diagnosis not present

## 2016-01-26 DIAGNOSIS — B9789 Other viral agents as the cause of diseases classified elsewhere: Secondary | ICD-10-CM | POA: Diagnosis not present

## 2016-01-26 DIAGNOSIS — J111 Influenza due to unidentified influenza virus with other respiratory manifestations: Secondary | ICD-10-CM

## 2016-01-26 LAB — POCT INFLUENZA A/B
Influenza A, POC: NEGATIVE
Influenza B, POC: NEGATIVE

## 2016-01-26 MED ORDER — THERAFLU SEVERE COLD/CGH DAY 20-10-650 MG PO PACK: PACK | ORAL | 0 refills | Status: DC

## 2016-01-26 NOTE — Assessment & Plan Note (Signed)
Mostly bronchitic symptoms, but also some flulike symptoms. She is just at the three-day mark, she can use some TheraFlu over-the-counter. Influenza test is negative.

## 2016-01-26 NOTE — Progress Notes (Signed)
  Subjective:    CC: Feeling sick  HPI: For the past several days this pleasant 61 year old female has had some sore throat, mild cough, low-grade fevers. Denies any muscle aches, body aches. Symptoms are mild, persistent. Cough is nonproductive, no shortness of breath or chest pain. No GI symptoms, no skin rash.  Past medical history:  Negative.  See flowsheet/record as well for more information.  Surgical history: Negative.  See flowsheet/record as well for more information.  Family history: Negative.  See flowsheet/record as well for more information.  Social history: Negative.  See flowsheet/record as well for more information.  Allergies, and medications have been entered into the medical record, reviewed, and no changes needed.   Review of Systems: No fevers, chills, night sweats, weight loss, chest pain, or shortness of breath.   Objective:    General: Well Developed, well nourished, and in no acute distress.  Neuro: Alert and oriented x3, extra-ocular muscles intact, sensation grossly intact.  HEENT: Normocephalic, atraumatic, pupils equal round reactive to light, neck supple, no masses, no lymphadenopathy, thyroid nonpalpable. Oropharynx, nasopharynx, ear canals unremarkable. Skin: Warm and dry, no rashes. Cardiac: Regular rate and rhythm, no murmurs rubs or gallops, no lower extremity edema.  Respiratory: Clear to auscultation bilaterally. Not using accessory muscles, speaking in full sentences.  Rapid flu test is negative.  Impression and Recommendations:    Viral upper respiratory infection Mostly bronchitic symptoms, but also some flulike symptoms. She is just at the three-day mark, she can use some TheraFlu over-the-counter. Influenza test is negative.

## 2016-02-01 ENCOUNTER — Ambulatory Visit: Payer: BLUE CROSS/BLUE SHIELD

## 2016-02-07 DIAGNOSIS — G4733 Obstructive sleep apnea (adult) (pediatric): Secondary | ICD-10-CM | POA: Diagnosis not present

## 2016-03-28 ENCOUNTER — Other Ambulatory Visit: Payer: Self-pay | Admitting: Physician Assistant

## 2016-04-30 ENCOUNTER — Other Ambulatory Visit: Payer: Self-pay | Admitting: Family Medicine

## 2016-05-02 ENCOUNTER — Ambulatory Visit (INDEPENDENT_AMBULATORY_CARE_PROVIDER_SITE_OTHER): Payer: BLUE CROSS/BLUE SHIELD | Admitting: Family Medicine

## 2016-05-02 VITALS — BP 113/63 | HR 95 | Temp 99.0°F | Wt 292.0 lb

## 2016-05-02 DIAGNOSIS — R635 Abnormal weight gain: Secondary | ICD-10-CM

## 2016-05-02 DIAGNOSIS — J019 Acute sinusitis, unspecified: Secondary | ICD-10-CM

## 2016-05-02 DIAGNOSIS — J209 Acute bronchitis, unspecified: Secondary | ICD-10-CM

## 2016-05-02 LAB — POCT INFLUENZA A/B
INFLUENZA A, POC: NEGATIVE
Influenza B, POC: NEGATIVE

## 2016-05-02 MED ORDER — PHENTERMINE HCL 37.5 MG PO TABS: 37.5000 mg | ORAL_TABLET | Freq: Every day | ORAL | 0 refills | Status: DC

## 2016-05-02 MED ORDER — AMOXICILLIN-POT CLAVULANATE 875-125 MG PO TABS: 1.0000 | ORAL_TABLET | Freq: Two times a day (BID) | ORAL | 0 refills | Status: DC

## 2016-05-02 NOTE — Addendum Note (Signed)
Addended by: Wyline Beady on: 05/02/2016 04:07 PM   Modules accepted: Orders

## 2016-05-02 NOTE — Progress Notes (Signed)
   Subjective:    Patient ID: Alicia Davies, female    DOB: 05-Aug-1955, 61 y.o.   MRN: [id number]  HPI 5 days of cough and congestion.  Started developing fever on Tuesday and sinus pressure.  She's had a severe headache and cough is productive. She has felt a little bit short of breath. Some body aches. No diarrhea. Highest fever was 102. Last fever was last night. She has a low grade temp here. She is having chills right now.     Review of Systems     Objective:   Physical Exam  Constitutional: She is oriented to person, place, and time. She appears well-developed and well-nourished.  HENT:  Head: Normocephalic and atraumatic.  Right Ear: External ear normal.  Left Ear: External ear normal.  Nose: Nose normal.  Mouth/Throat: Oropharynx is clear and moist.  TMs and canals are clear.   Eyes: Conjunctivae and EOM are normal. Pupils are equal, round, and reactive to light.  Neck: Neck supple. No thyromegaly present.  Cardiovascular: Normal rate, regular rhythm and normal heart sounds.   Pulmonary/Chest: Effort normal and breath sounds normal. She has no wheezes.  Lymphadenopathy:    She has no cervical adenopathy.  Neurological: She is alert and oriented to person, place, and time.  Skin: Skin is warm and dry.  Psychiatric: She has a normal mood and affect.        Assessment & Plan:  Acute sinusitis/brochitis - will start augemtin. Flu test negative.  Call if not better in 5 days.  Call if getting worse.    Abnormal weight gain  - refil her phentermine. F/U with Jade in 4 weeks.

## 2016-05-30 ENCOUNTER — Other Ambulatory Visit: Payer: Self-pay | Admitting: Physician Assistant

## 2016-06-21 ENCOUNTER — Other Ambulatory Visit: Payer: Self-pay | Admitting: Physician Assistant

## 2016-07-03 ENCOUNTER — Other Ambulatory Visit: Payer: Self-pay | Admitting: Physician Assistant

## 2016-07-18 ENCOUNTER — Other Ambulatory Visit: Payer: Self-pay | Admitting: Physician Assistant

## 2016-09-02 ENCOUNTER — Encounter: Payer: Self-pay | Admitting: Physician Assistant

## 2016-09-02 ENCOUNTER — Ambulatory Visit (INDEPENDENT_AMBULATORY_CARE_PROVIDER_SITE_OTHER): Payer: BLUE CROSS/BLUE SHIELD | Admitting: Physician Assistant

## 2016-09-02 VITALS — BP 119/75 | HR 71 | Ht 69.0 in | Wt 309.0 lb

## 2016-09-02 DIAGNOSIS — M5136 Other intervertebral disc degeneration, lumbar region: Secondary | ICD-10-CM | POA: Diagnosis not present

## 2016-09-02 DIAGNOSIS — F3342 Major depressive disorder, recurrent, in full remission: Secondary | ICD-10-CM

## 2016-09-02 DIAGNOSIS — I10 Essential (primary) hypertension: Secondary | ICD-10-CM

## 2016-09-02 DIAGNOSIS — M62838 Other muscle spasm: Secondary | ICD-10-CM | POA: Diagnosis not present

## 2016-09-02 MED ORDER — METHOCARBAMOL 500 MG PO TABS: 500.0000 mg | ORAL_TABLET | Freq: Three times a day (TID) | ORAL | 1 refills | Status: DC

## 2016-09-02 MED ORDER — LIRAGLUTIDE -WEIGHT MANAGEMENT 18 MG/3ML ~~LOC~~ SOPN: 0.6000 mg | PEN_INJECTOR | Freq: Every day | SUBCUTANEOUS | 1 refills | Status: DC

## 2016-09-02 MED ORDER — SERTRALINE HCL 50 MG PO TABS: 50.0000 mg | ORAL_TABLET | Freq: Every day | ORAL | 1 refills | Status: DC

## 2016-09-02 MED ORDER — TRAMADOL HCL 50 MG PO TABS: 50.0000 mg | ORAL_TABLET | Freq: Three times a day (TID) | ORAL | 0 refills | Status: DC | PRN

## 2016-09-02 MED ORDER — LISINOPRIL-HYDROCHLOROTHIAZIDE 20-25 MG PO TABS: 1.0000 | ORAL_TABLET | Freq: Every day | ORAL | 1 refills | Status: DC

## 2016-09-02 MED ORDER — KETOROLAC TROMETHAMINE 60 MG/2ML IM SOLN
60.0000 mg | Freq: Once | INTRAMUSCULAR | Status: AC
  Administered 2016-09-02: 60 mg via INTRAMUSCULAR

## 2016-09-02 NOTE — Patient Instructions (Signed)
Low Back Sprain Rehab  Ask your health care provider which exercises are safe for you. Do exercises exactly as told by your health care provider and adjust them as directed. It is normal to feel mild stretching, pulling, tightness, or discomfort as you do these exercises, but you should stop right away if you feel sudden pain or your pain gets worse. Do not begin these exercises until told by your health care provider.  Stretching and range of motion exercises  These exercises warm up your muscles and joints and improve the movement and flexibility of your back. These exercises also help to relieve pain, numbness, and tingling.  Exercise A: Lumbar rotation    1. Lie on your back on a firm surface and bend your knees.  2. Straighten your arms out to your sides so each arm forms an "L" shape with a side of your body (a 90 degree angle).  3. Slowly move both of your knees to one side of your body until you feel a stretch in your lower back. Try not to let your shoulders move off of the floor.  4. Hold for __________ seconds.  5. Tense your abdominal muscles and slowly move your knees back to the starting position.  6. Repeat this exercise on the other side of your body.  Repeat __________ times. Complete this exercise __________ times a day.  Exercise B: Prone extension on elbows    1. Lie on your abdomen on a firm surface.  2. Prop yourself up on your elbows.  3. Use your arms to help lift your chest up until you feel a gentle stretch in your abdomen and your lower back.  ? This will place some of your body weight on your elbows. If this is uncomfortable, try stacking pillows under your chest.  ? Your hips should stay down, against the surface that you are lying on. Keep your hip and back muscles relaxed.  4. Hold for __________ seconds.  5. Slowly relax your upper body and return to the starting position.  Repeat __________ times. Complete this exercise __________ times a day.  Strengthening exercises  These  exercises build strength and endurance in your back. Endurance is the ability to use your muscles for a long time, even after they get tired.  Exercise C: Pelvic tilt  1. Lie on your back on a firm surface. Bend your knees and keep your feet flat.  2. Tense your abdominal muscles. Tip your pelvis up toward the ceiling and flatten your lower back into the floor.  ? To help with this exercise, you may place a small towel under your lower back and try to push your back into the towel.  3. Hold for __________ seconds.  4. Let your muscles relax completely before you repeat this exercise.  Repeat __________ times. Complete this exercise __________ times a day.  Exercise D: Alternating arm and leg raises    1. Get on your hands and knees on a firm surface. If you are on a hard floor, you may want to use padding to cushion your knees, such as an exercise mat.  2. Line up your arms and legs. Your hands should be below your shoulders, and your knees should be below your hips.  3. Lift your left leg behind you. At the same time, raise your right arm and straighten it in front of you.  ? Do not lift your leg higher than your hip.  ? Do not lift your arm   higher than your shoulder.  ? Keep your abdominal and back muscles tight.  ? Keep your hips facing the ground.  ? Do not arch your back.  ? Keep your balance carefully, and do not hold your breath.  4. Hold for __________ seconds.  5. Slowly return to the starting position and repeat with your right leg and your left arm.  Repeat __________ times. Complete this exercise __________ times a day.  Exercise E: Abdominal set with straight leg raise    1. Lie on your back on a firm surface.  2. Bend one of your knees and keep your other leg straight.  3. Tense your abdominal muscles and lift your straight leg up, 4-6 inches (10-15 cm) off the ground.  4. Keep your abdominal muscles tight and hold for __________ seconds.  ? Do not hold your breath.  ? Do not arch your back. Keep it  flat against the ground.  5. Keep your abdominal muscles tense as you slowly lower your leg back to the starting position.  6. Repeat with your other leg.  Repeat __________ times. Complete this exercise __________ times a day.  Posture and body mechanics    Body mechanics refers to the movements and positions of your body while you do your daily activities. Posture is part of body mechanics. Good posture and healthy body mechanics can help to relieve stress in your body's tissues and joints. Good posture means that your spine is in its natural S-curve position (your spine is neutral), your shoulders are pulled back slightly, and your head is not tipped forward. The following are general guidelines for applying improved posture and body mechanics to your everyday activities.  Standing    · When standing, keep your spine neutral and your feet about hip-width apart. Keep a slight bend in your knees. Your ears, shoulders, and hips should line up.  · When you do a task in which you stand in one place for a long time, place one foot up on a stable object that is 2-4 inches (5-10 cm) high, such as a footstool. This helps keep your spine neutral.  Sitting    · When sitting, keep your spine neutral and keep your feet flat on the floor. Use a footrest, if necessary, and keep your thighs parallel to the floor. Avoid rounding your shoulders, and avoid tilting your head forward.  · When working at a desk or a computer, keep your desk at a height where your hands are slightly lower than your elbows. Slide your chair under your desk so you are close enough to maintain good posture.  · When working at a computer, place your monitor at a height where you are looking straight ahead and you do not have to tilt your head forward or downward to look at the screen.  Resting    · When lying down and resting, avoid positions that are most painful for you.  · If you have pain with activities such as sitting, bending, stooping, or squatting  (flexion-based activities), lie in a position in which your body does not bend very much. For example, avoid curling up on your side with your arms and knees near your chest (fetal position).  · If you have pain with activities such as standing for a long time or reaching with your arms (extension-based activities), lie with your spine in a neutral position and bend your knees slightly. Try the following positions:  · Lying on your side with a   pillow between your knees.  · Lying on your back with a pillow under your knees.  Lifting    · When lifting objects, keep your feet at least shoulder-width apart and tighten your abdominal muscles.  · Bend your knees and hips and keep your spine neutral. It is important to lift using the strength of your legs, not your back. Do not lock your knees straight out.  · Always ask for help to lift heavy or awkward objects.  This information is not intended to replace advice given to you by your health care provider. Make sure you discuss any questions you have with your health care provider.  Document Released: 12/31/2004 Document Revised: 09/07/2015 Document Reviewed: 10/12/2014  Elsevier Interactive Patient Education © 2018 [employer]

## 2016-09-02 NOTE — Progress Notes (Signed)
Subjective:    Patient ID: Alicia Davies, female    DOB: 1956-01-11, 61 y.o.   MRN: [id number]  HPI  Pt is a 61 yo morbidly obese female who presents to the clinic with muscle tightness/spasm in mid to low back. She has lumbar DDD with no radiation into legs, numbness or tingling, saddle anesthesia. All her pain is in the muscles in the back. She request a muscle relaxers. She is not watching her weight or attempting to rehab low back.   HTN-doing well. No CP, palpitations, headaches, vision changes.   .. Active Ambulatory Problems    Diagnosis Date Noted  . B12 DEFICIENCY 12/11/2009  . UNSPECIFIED VITAMIN D DEFICIENCY 12/05/2009  . HYPERCALCEMIA 12/06/2009  . OBESITY, UNSPECIFIED 12/05/2009  . MENOPAUSAL SYNDROME 12/05/2009  . FATIGUE 12/05/2009  . Primary hyperparathyroidism (HCC) 01/29/2010  . POSTMENOPAUSAL STATUS 01/17/2010  . Right knee pain 08/24/2010  . Severe obstructive sleep apnea 10/12/2012  . Benign cyst of left breast 10/28/2012  . Depression 07/21/2013  . Essential hypertension, benign 07/21/2013  . Severe obesity (BMI >= 40) (HCC) 07/21/2013  . Hyperlipidemia 07/21/2013  . Swelling of lower extremity 07/21/2013  . OSA (obstructive sleep apnea) 09/29/2013  . S/P right knee arthroscopy 02/28/2014  . Muscle spasm 03/22/2014  . Hyperglycemia 06/10/2014  . Seborrheic keratoses 07/14/2015  . Piriformis syndrome of left side 07/14/2015  . Morbid obesity (HCC) 07/14/2015  . CKD (chronic kidney disease) stage 3, GFR 30-59 ml/min 10/15/2015  . Viral upper respiratory infection 01/26/2016   Resolved Ambulatory Problems    Diagnosis Date Noted  . No Resolved Ambulatory Problems   Past Medical History:  Diagnosis Date  . Hyperparathyroidism   . Kidney stones       Review of Systems  All other systems reviewed and are negative.      Objective:   Physical Exam  Constitutional: She is oriented to person, place, and time. She appears well-developed and  well-nourished.  Morbidly obese.   HENT:  Head: Normocephalic and atraumatic.  Cardiovascular: Normal rate, regular rhythm and normal heart sounds.   Pulmonary/Chest: Effort normal and breath sounds normal.  Musculoskeletal:  ROM limited at waist due to pain.  No tenderness to palpation over cervical, thoracic, lumbar spine.  Paraspinal muscles tight and painful to touch.   Neurological: She is alert and oriented to person, place, and time.  Psychiatric: She has a normal mood and affect. Her behavior is normal.          Assessment & Plan:  Marland KitchenMarland KitchenZyniah was seen today for follow-up.  Diagnoses and all orders for this visit:  Muscle spasm -     methocarbamol (ROBAXIN) 500 MG tablet; Take 1 tablet (500 mg total) by mouth 3 (three) times daily. -     traMADol (ULTRAM) 50 MG tablet; Take 1 tablet (50 mg total) by mouth every 8 (eight) hours as needed. -     ketorolac (TORADOL) injection 60 mg; Inject 2 mLs (60 mg total) into the muscle once.  Essential hypertension, benign -     lisinopril-hydrochlorothiazide (PRINZIDE,ZESTORETIC) 20-25 MG tablet; Take 1 tablet by mouth daily.  Recurrent major depressive disorder, in full remission (HCC) -     sertraline (ZOLOFT) 50 MG tablet; Take 1 tablet (50 mg total) by mouth daily.  Morbid obesity (HCC) -     Liraglutide -Weight Management (SAXENDA) 18 MG/3ML SOPN; Inject 0.6 mg into the skin daily. For one week then increase by .6mg  weekly until reaches 3mg  daily.  Please include ultra fine needles 41mm  DDD (degenerative disc disease), lumbar -     methocarbamol (ROBAXIN) 500 MG tablet; Take 1 tablet (500 mg total) by mouth 3 (three) times daily. -     traMADol (ULTRAM) 50 MG tablet; Take 1 tablet (50 mg total) by mouth every 8 (eight) hours as needed. -     ketorolac (TORADOL) injection 60 mg; Inject 2 mLs (60 mg total) into the muscle once.   .. Depression screen [facility] 2/9 09/02/2016  Decreased Interest 0  Down, Depressed, Hopeless 0  PHQ -  2 Score 0  Altered sleeping 0  Tired, decreased energy 0  Change in appetite 0  Feeling bad or failure about yourself  0  Trouble concentrating 0  Moving slowly or fidgety/restless 0  Suicidal thoughts 0  PHQ-9 Score 0  Difficult doing work/chores Not difficult at all   Refilled medications.  Toradol injection given today.  Robaxin given for muscles.  Discussed treatment for muslce spasms: PT, biofreeze, alternating heat and ice, massage and WEIGHT loss.   Discussed low carb diet with 1500 calories and 80g of protein.  Exercising at least 150 minutes a week.  [employer] could be a Chief Technology Officer.  Pt tried phentermine, belviq.  saxenda sent to pharmacy. SE reviewed.

## 2016-09-03 ENCOUNTER — Telehealth: Payer: Self-pay

## 2016-09-03 NOTE — Telephone Encounter (Signed)
Pt called this morning and the saxenda is not covered by insurance.  Is there something else you recommend?  Please advise.

## 2016-09-03 NOTE — Telephone Encounter (Signed)
She has already tried belviq and phentermine. What about a referral to bariatric clinic to consider surgery?

## 2016-09-05 DIAGNOSIS — M51369 Other intervertebral disc degeneration, lumbar region without mention of lumbar back pain or lower extremity pain: Secondary | ICD-10-CM

## 2016-09-05 DIAGNOSIS — M5136 Other intervertebral disc degeneration, lumbar region: Secondary | ICD-10-CM | POA: Insufficient documentation

## 2016-09-05 HISTORY — DX: Other intervertebral disc degeneration, lumbar region without mention of lumbar back pain or lower extremity pain: M51.369

## 2016-09-05 HISTORY — DX: Other intervertebral disc degeneration, lumbar region: M51.36

## 2016-09-05 NOTE — Telephone Encounter (Signed)
She is going to consider this and will let the office know.

## 2016-10-11 ENCOUNTER — Ambulatory Visit (INDEPENDENT_AMBULATORY_CARE_PROVIDER_SITE_OTHER): Payer: BLUE CROSS/BLUE SHIELD | Admitting: Physician Assistant

## 2016-10-11 ENCOUNTER — Encounter: Payer: Self-pay | Admitting: Physician Assistant

## 2016-10-11 VITALS — BP 141/69 | HR 64 | Wt 345.0 lb

## 2016-10-11 DIAGNOSIS — M62830 Muscle spasm of back: Secondary | ICD-10-CM

## 2016-10-11 DIAGNOSIS — G5702 Lesion of sciatic nerve, left lower limb: Secondary | ICD-10-CM | POA: Diagnosis not present

## 2016-10-11 MED ORDER — CARISOPRODOL 350 MG PO TABS: 350.0000 mg | ORAL_TABLET | Freq: Three times a day (TID) | ORAL | 0 refills | Status: DC | PRN

## 2016-10-11 NOTE — Progress Notes (Signed)
Subjective:    Patient ID: Alicia Davies, female    DOB: 07/21/1955, 61 y.o.   MRN: [id number]  HPI  Pt is a 61 yo female who presents to the clinic with recurrent and persisent muscle spasms of the low back worse left than right. symtpoms have been present for many years but worse over past few months. She has tried mobic, muscle relaxers, heat, exercises. She can't vacuum, wash dishes, walk or stand for long periods of time without her back spasming on her.   Denies any radiation down legs, bladder dysfunction or saddle anesthesia.  She has had twice some stool in underwear when she wakes up.   .. Active Ambulatory Problems    Diagnosis Date Noted  . B12 DEFICIENCY 12/11/2009  . UNSPECIFIED VITAMIN D DEFICIENCY 12/05/2009  . HYPERCALCEMIA 12/06/2009  . OBESITY, UNSPECIFIED 12/05/2009  . MENOPAUSAL SYNDROME 12/05/2009  . FATIGUE 12/05/2009  . Primary hyperparathyroidism (HCC) 01/29/2010  . POSTMENOPAUSAL STATUS 01/17/2010  . Right knee pain 08/24/2010  . Severe obstructive sleep apnea 10/12/2012  . Benign cyst of left breast 10/28/2012  . Depression 07/21/2013  . Essential hypertension, benign 07/21/2013  . Severe obesity (BMI >= 40) (HCC) 07/21/2013  . Hyperlipidemia 07/21/2013  . Swelling of lower extremity 07/21/2013  . OSA (obstructive sleep apnea) 09/29/2013  . S/P right knee arthroscopy 02/28/2014  . Muscle spasm 03/22/2014  . Hyperglycemia 06/10/2014  . Seborrheic keratoses 07/14/2015  . Piriformis syndrome of left side 07/14/2015  . Morbid obesity (HCC) 07/14/2015  . CKD (chronic kidney disease) stage 3, GFR 30-59 ml/min 10/15/2015  . Viral upper respiratory infection 01/26/2016  . DDD (degenerative disc disease), lumbar 09/05/2016   Resolved Ambulatory Problems    Diagnosis Date Noted  . No Resolved Ambulatory Problems   Past Medical History:  Diagnosis Date  . Hyperparathyroidism   . Kidney stones       Review of Systems  All other systems reviewed and  are negative.      Objective:   Physical Exam  Constitutional: She appears well-developed and well-nourished.  Morbidly obese.   Musculoskeletal:  Pt has very limited ROM at waist due to pain.  No lumbar spinal tenderness.  Negative straight leg.  Tenderness more left than right through left buttocks and low back.           Assessment & Plan:  Marland KitchenMarland KitchenJury was seen today for back pain.  Diagnoses and all orders for this visit:  Muscle spasm of back -     carisoprodol (SOMA) 350 MG tablet; Take 1 tablet (350 mg total) by mouth 3 (three) times daily as needed for muscle spasms. -     diclofenac (VOLTAREN) 75 MG EC tablet; Take 1 tablet (75 mg total) by mouth 2 (two) times daily. -     Ambulatory referral to Physical Therapy  Piriformis syndrome of left side -     carisoprodol (SOMA) 350 MG tablet; Take 1 tablet (350 mg total) by mouth 3 (three) times daily as needed for muscle spasms. -     diclofenac (VOLTAREN) 75 MG EC tablet; Take 1 tablet (75 mg total) by mouth 2 (two) times daily. -     Ambulatory referral to Physical Therapy  last XRAy of lumbar spine done 2016. We may need to get MRI if PT not helping.   I am hoping dry needling will help.  Toradol  IM given in office today.  Stop robaxin and start soma. Side effects discussed.  Stop mobic  and start diclofenac bid.  Follow up in 4 weeks with sports medicine if no improvement.

## 2016-10-13 ENCOUNTER — Encounter: Payer: Self-pay | Admitting: Physician Assistant

## 2016-10-13 ENCOUNTER — Telehealth: Payer: Self-pay | Admitting: Physician Assistant

## 2016-10-13 MED ORDER — DICLOFENAC SODIUM 75 MG PO TBEC: 75.0000 mg | DELAYED_RELEASE_TABLET | Freq: Two times a day (BID) | ORAL | 1 refills | Status: DC

## 2016-10-13 NOTE — Telephone Encounter (Signed)
Toradol shot not recorded

## 2016-10-14 DIAGNOSIS — M62830 Muscle spasm of back: Secondary | ICD-10-CM | POA: Diagnosis not present

## 2016-10-14 DIAGNOSIS — G5702 Lesion of sciatic nerve, left lower limb: Secondary | ICD-10-CM | POA: Diagnosis not present

## 2016-10-14 MED ORDER — KETOROLAC TROMETHAMINE 60 MG/2ML IM SOLN
60.0000 mg | Freq: Once | INTRAMUSCULAR | Status: AC
  Administered 2016-10-14: 60 mg via INTRAMUSCULAR

## 2016-10-14 NOTE — Telephone Encounter (Signed)
Documentation added.  

## 2016-10-14 NOTE — Addendum Note (Signed)
Addended by: Wyline Beady on: 10/14/2016 04:03 PM   Modules accepted: Orders

## 2016-10-24 ENCOUNTER — Ambulatory Visit (INDEPENDENT_AMBULATORY_CARE_PROVIDER_SITE_OTHER): Payer: BLUE CROSS/BLUE SHIELD | Admitting: Rehabilitative and Restorative Service Providers"

## 2016-10-24 ENCOUNTER — Encounter: Payer: Self-pay | Admitting: Rehabilitative and Restorative Service Providers"

## 2016-10-24 DIAGNOSIS — R29898 Other symptoms and signs involving the musculoskeletal system: Secondary | ICD-10-CM | POA: Diagnosis not present

## 2016-10-24 DIAGNOSIS — M545 Low back pain: Secondary | ICD-10-CM | POA: Diagnosis not present

## 2016-10-24 DIAGNOSIS — G8929 Other chronic pain: Secondary | ICD-10-CM | POA: Diagnosis not present

## 2016-10-24 NOTE — Therapy (Signed)
[facility] [address] [place], [place], [address] Phone: [phone]   Fax:  [phone]  Physical Therapy Evaluation  Patient Details  Name: Alicia Davies MRN: [id number] Date of Birth: 05/01/1955 Referring Provider: Tandy Gaw, PA-C  Encounter Date: 10/24/2016      PT End of Session - 10/24/16 1631    Visit Number 1   Number of Visits 12   Date for PT Re-Evaluation 12/04/16   PT Start Time 1457   PT Stop Time 1556   PT Time Calculation (min) 59 min   Activity Tolerance Patient tolerated treatment well      Past Medical History:  Diagnosis Date  . Hyperparathyroidism   . Kidney stones     Past Surgical History:  Procedure Laterality Date  . ABDOMINAL HYSTERECTOMY    . APPENDECTOMY    . CHOLECYSTECTOMY    . FOOT SURGERY    . KNEE ARTHROSCOPY     Right and lft.   [name] PARATHYROIDECTOMY  04/2010   done by Dr Gerrit Friends  . SHOULDER SURGERY      There were no vitals filed for this visit.       Subjective Assessment - 10/24/16 1505    Subjective Emberlie reports that she has had LBP for years. She has been treated wit hPT, chiropractic care, medications, etc.  Pain continues to worsen in the past 3 months. She has arthritis and scoliosis in spine. She has intense muscle spasms frequently.    Pertinent History bilat TKA 4-6 years ago no problems with surgery or rehab; torn rotator cuff in the Lt shoulder in the past; HTN; obesity   How long can you sit comfortably? 10-15 min    How long can you stand comfortably? 10-15 min    How long can you walk comfortably? 10-15 min    Diagnostic tests xrays multilevel DDD of lumbar spine    Patient Stated Goals to be able to do activities she wants to do to enjoy life    Currently in Pain? Yes   Pain Score 4    Pain Location Back   Pain Orientation Left;Lower   Pain Descriptors / Indicators Sharp;Constant   Pain Type Chronic pain   Pain Radiating Towards upper  posterior hip area into the anterior hip - burning sensation in upper thighs when she lies down    Pain Onset More than a month ago   Pain Frequency Constant   Aggravating Factors  prolonged positions; loading/unloading dishwasher; folding clothes; any activity             [facility] PT Assessment - 10/24/16 0001      Assessment   Medical Diagnosis LBP; Ly piriformis symdrome   Referring Provider Tandy Gaw, PA-C   Onset Date/Surgical Date 07/14/16  wosening in the past 3 months - pain for years    Hand Dominance Right   Next MD Visit to schedule    Prior Therapy yes - PT/chiropractic care     Precautions   Precautions None     Balance Screen   Has the patient fallen in the past 6 months No   Has the patient had a decrease in activity level because of a fear of falling?  No   Is the patient reluctant to leave their home because of a fear of falling?  No     Home Environment   Additional Comments single level home - one step into home      Prior Function  Level of Independence Independent   Vocation Other (comment)   Vocation Requirements household chores sedentary      Observation/Other Assessments   Focus on Therapeutic Outcomes (FOTO)  61% limitation      Sensation   Additional Comments burning in the anterior thighs bilat at times when she lies down      Posture/Postural Control   Posture Comments significant head forward; shoudlers rounded and elevated; flexed forward through trunk and hips      AROM   Overall AROM Comments bilat hip moblity limited by obesity    AROM Assessment Site --  discomfort with all trunk motions    Lumbar Flexion 45%   Lumbar Extension 25%   Lumbar - Right Side Bend 45%   Lumbar - Left Side Bend 50%   Lumbar - Right Rotation 25%   Lumbar - Left Rotation 20%     Strength   Overall Strength Comments 5/5 bilat UE's      Flexibility   Hamstrings tight Rt ~ 75 deg; Lt 65 deg   Quadriceps tight Lt    ITB tight Lt > Rt     Piriformis tight Lt      Palpation   Spinal mobility pain with spring testing lumbar spine    Palpation comment muscular tightness noted through the Lt thoracolumbar musculature; QL; lats; piriformis; gluts      Special Tests    Special Tests --  (-) SLR; [place]ers      Transfers   Comments moves with difficulty sit to stand and stand to sit; any rolling or turning      Ambulation/Gait   Gait Comments antalgic gait pattern      Balance   Balance Assessed --  SLS 3-5 sec with UE assist             Objective measurements completed on examination: See above findings.          [facility] Adult PT Treatment/Exercise - 10/24/16 0001      Self-Care   Self-Care --  education re sitting position(modifying recliner)     Therapeutic Activites    Therapeutic Activities --  encouraged consistent exercise     Lumbar Exercises: Stretches   Single Knee to Chest Stretch 3 reps;20 seconds   Piriformis Stretch 2 reps;30 seconds  modified due to patient's weight      Moist Heat Therapy   Number Minutes Moist Heat 20 Minutes   Moist Heat Location Lumbar Spine;Hip  Lt      Programme researcher, broadcasting/film/video Location Lt lumbar to Lt piriformis    Electrical Stimulation Action IFC   Electrical Stimulation Parameters to tolerance   Electrical Stimulation Goals Pain;Tone;Other (comment)  spasm                 PT Education - 10/24/16 1544    Education provided (P)  Yes   Education Details (P)  HEP TENS DN back care    Person(s) Educated (P)  Patient   Methods (P)  Explanation;Demonstration;Tactile cues;Verbal cues;Handout   Comprehension (P)  Verbalized understanding;Returned demonstration;Verbal cues required;Tactile cues required             PT Long Term Goals - 10/24/16 1642      PT LONG TERM GOAL #1   Title I with advanced HEP 12/04/16   Time 6   Period Weeks   Status New     PT LONG TERM GOAL #2   Title perform bed mobility without  pain   12/04/16   Time 6   Period Weeks   Status New     PT LONG TERM GOAL #3   Title report pain decrease by 50-75% with ADLs 12/04/16   Time 6   Period Weeks   Status New     PT LONG TERM GOAL #4   Title improve FOTO =/< 47% limited 12/04/16   Time 6   Period Weeks   Status New                Plan - 10/24/16 1635    Clinical Impression Statement Alicia Davies presents with 3 month flare up of chronic back pain and Lt hip pain. Patient presents with antalgic gait; poor posture; limited movement; pain with movement; muscular tightness and spasm Lt thoracolumbar spine and piriformis/hip abductor musculature. She will benefit form trial of PT to address problems identified.    History and Personal Factors relevant to plan of care: treated with PT, chiropractic care; meds without sucess in past    Clinical Decision Making Low   Rehab Potential Good   Clinical Impairments Affecting Rehab Potential sedentary lifestyle; obesity; chronic nature of symptoms    PT Frequency 2x / week   PT Duration 6 weeks   PT Treatment/Interventions Patient/family education;ADLs/Self Care Home Management;Cryotherapy;Electrical Stimulation;Iontophoresis /ml Dexamethasone;Moist Heat;Ultrasound;Dry needling;Manual techniques;Therapeutic activities;Therapeutic exercise;Neuromuscular re-education   PT Next Visit Plan trial of DN; review exercise; add basic 3 part core exercise; back care education; manual work and modalities as indicated    Consulted and Agree with Plan of Care Patient      Patient will benefit from skilled therapeutic intervention in order to improve the following deficits and impairments:  Pain, Improper body mechanics, Postural dysfunction, Increased muscle spasms, Increased fascial restricitons, Decreased mobility, Decreased activity tolerance, Abnormal gait  Visit Diagnosis: Chronic left-sided low back pain, with sciatica presence unspecified - Plan: PT plan of care cert/re-cert  Other  symptoms and signs involving the musculoskeletal system - Plan: PT plan of care cert/re-cert     Problem List Patient Active Problem List   Diagnosis Date Noted  . DDD (degenerative disc disease), lumbar 09/05/2016  . Viral upper respiratory infection 01/26/2016  . CKD (chronic kidney disease) stage 3, GFR 30-59 ml/min (HCC) 10/15/2015  . Seborrheic keratoses 07/14/2015  . Piriformis syndrome of left side 07/14/2015  . Morbid obesity (HCC) 07/14/2015  . Hyperglycemia 06/10/2014  . Muscle spasm 03/22/2014  . S/P right knee arthroscopy 02/28/2014  . OSA (obstructive sleep apnea) 09/29/2013  . Depression 07/21/2013  . Essential hypertension, benign 07/21/2013  . Severe obesity (BMI >= 40) (HCC) 07/21/2013  . Hyperlipidemia 07/21/2013  . Swelling of lower extremity 07/21/2013  . Benign cyst of left breast 10/28/2012  . Severe obstructive sleep apnea 10/12/2012  . Right knee pain 08/24/2010  . Primary hyperparathyroidism (HCC) 01/29/2010  . POSTMENOPAUSAL STATUS 01/17/2010  . B12 DEFICIENCY 12/11/2009  . HYPERCALCEMIA 12/06/2009  . UNSPECIFIED VITAMIN D DEFICIENCY 12/05/2009  . OBESITY, UNSPECIFIED 12/05/2009  . MENOPAUSAL SYNDROME 12/05/2009  . FATIGUE 12/05/2009    Madox Corkins Rober Minion PT, MPH  10/24/2016, 4:50 PM  [facility] [address] [place] [address] [place], [place], [address] Phone: [phone]   Fax:  [phone]  Name: Alicia Davies MRN: [id number] Date of Birth: Mar 11, 1955

## 2016-10-24 NOTE — Patient Instructions (Signed)
Knee to Chest    Lying supine, bend involved knee to chest hold 20-30 sec repeat 5 times. Repeat with other leg. Do _3__ times per day.  Piriformis Stretch   Lying on back, pull right knee toward opposite shoulder. Hold 30 seconds. Repeat 3 times. Do 2-3 sessions per day.  TENS UNIT: This is helpful for muscle pain and spasm.   Search and Purchase a TENS 7000 2nd edition at www.tenspros.com. It should be less than $30.     TENS unit instructions: Do not shower or bathe with the unit on Turn the unit off before removing electrodes or batteries If the electrodes lose stickiness add a drop of water to the electrodes after they are disconnected from the unit and place on plastic sheet. If you continued to have difficulty, call the TENS unit company to purchase more electrodes. Do not apply lotion on the skin area prior to use. Make sure the skin is clean and dry as this will help prolong the life of the electrodes. After use, always check skin for unusual red areas, rash or other skin difficulties. If there are any skin problems, does not apply electrodes to the same area. Never remove the electrodes from the unit by pulling the wires. Do not use the TENS unit or electrodes other than as directed. Do not change electrode placement without consultating your therapist or physician. Keep 2 fingers with between each electrode.   Trigger Point Dry Needling  . What is Trigger Point Dry Needling (DN)? o DN is a physical therapy technique used to treat muscle pain and dysfunction. Specifically, DN helps deactivate muscle trigger points (muscle knots).  o A thin filiform needle is used to penetrate the skin and stimulate the underlying trigger point. The goal is for a local twitch response (LTR) to occur and for the trigger point to relax. No medication of any kind is injected during the procedure.   . What Does Trigger Point Dry Needling Feel Like?  o The procedure feels different for each  individual patient. Some patients report that they do not actually feel the needle enter the skin and overall the process is not painful. Very mild bleeding may occur. However, many patients feel a deep cramping in the muscle in which the needle was inserted. This is the local twitch response.   [name] How Will I feel after the treatment? o Soreness is normal, and the onset of soreness may not occur for a few hours. Typically this soreness does not last longer than two days.  o Bruising is uncommon, however; ice can be used to decrease any possible bruising.  o In rare cases feeling tired or nauseous after the treatment is normal. In addition, your symptoms may get worse before they get better, this period will typically not last longer than 24 hours.   . What Can I do After My Treatment? o Increase your hydration by drinking more water for the next 24 hours. o You may place ice or heat on the areas treated that have become sore, however, do not use heat on inflamed or bruised areas. Heat often brings more relief post needling. o You can continue your regular activities, but vigorous activity is not recommended initially after the treatment for 24 hours. o DN is best combined with other physical therapy such as strengthening, stretching, and other therapies.   Sleeping on Back  Place pillow under knees. A pillow with cervical support and a roll around waist are also helpful. Copyright  VHI. All rights reserved.  Sleeping on Side Place pillow between knees. Use cervical support under neck and a roll around waist as needed. Copyright  VHI. All rights reserved.   Sleeping on Stomach   If this is the only desirable sleeping position, place pillow under lower legs, and under stomach or chest as needed.  Posture - Sitting   Sit upright, head facing forward. Try using a roll to support lower back. Keep shoulders relaxed, and avoid rounded back. Keep hips level with knees. Avoid crossing legs for  long periods. Stand to Sit / Sit to Stand   To sit: Bend knees to lower self onto front edge of chair, then scoot back on seat. To stand: Reverse sequence by placing one foot forward, and scoot to front of seat. Use rocking motion to stand up.   Work Height and Reach  Ideal work height is no more than 2 to 4 inches below elbow level when standing, and at elbow level when sitting. Reaching should be limited to arm's length, with elbows slightly bent.  Bending  Bend at hips and knees, not back. Keep feet shoulder-width apart.    Posture - Standing   Good posture is important. Avoid slouching and forward head thrust. Maintain curve in low back and align ears over shoul- ders, hips over ankles.  Alternating Positions   Alternate tasks and change positions frequently to reduce fatigue and muscle tension. Take rest breaks. Computer Work   Position work to Art gallery manager. Use proper work and seat height. Keep shoulders back and down, wrists straight, and elbows at right angles. Use chair that provides full back support. Add footrest and lumbar roll as needed.  Getting Into / Out of Car  Lower self onto seat, scoot back, then bring in one leg at a time. Reverse sequence to get out.  Dressing  Lie on back to pull socks or slacks over feet, or sit and bend leg while keeping back straight.    Housework - Sink  Place one foot on ledge of cabinet under sink when standing at sink for prolonged periods.   Pushing / Pulling  Pushing is preferable to pulling. Keep back in proper alignment, and use leg muscles to do the work.  Deep Squat   Squat and lift with both arms held against upper trunk. Tighten stomach muscles without holding breath. Use smooth movements to avoid jerking.  Avoid Twisting   Avoid twisting or bending back. Pivot around using foot movements, and bend at knees if needed when reaching for articles.  Carrying Luggage   Distribute weight evenly on both  sides. Use a cart whenever possible. Do not twist trunk. Move body as a unit.   Lifting Principles .Maintain proper posture and head alignment. .Slide object as close as possible before lifting. .Move obstacles out of the way. .Test before lifting; ask for help if too heavy. .Tighten stomach muscles without holding breath. .Use smooth movements; do not jerk. .Use legs to do the work, and pivot with feet. .Distribute the work load symmetrically and close to the center of trunk. .Push instead of pull whenever possible.   Ask For Help   Ask for help and delegate to others when possible. Coordinate your movements when lifting together, and maintain the low back curve.  Log Roll   Lying on back, bend left knee and place left arm across chest. Roll all in one movement to the right. Reverse to roll to the left. Always move as one unit.  Housework - Sweeping  Use long-handled equipment to avoid stooping.   Housework - Wiping  Position yourself as close as possible to reach work surface. Avoid straining your back.  Laundry - Unloading Wash   To unload small items at bottom of washer, lift leg opposite to arm being used to reach.  Gardening - Raking  Move close to area to be raked. Use arm movements to do the work. Keep back straight and avoid twisting.     Cart  When reaching into cart with one arm, lift opposite leg to keep back straight.   Getting Into / Out of Bed  Lower self to lie down on one side by raising legs and lowering head at the same time. Use arms to assist moving without twisting. Bend both knees to roll onto back if desired. To sit up, start from lying on side, and use same move-ments in reverse. Housework - Vacuuming  Hold the vacuum with arm held at side. Step back and forth to move it, keeping head up. Avoid twisting.   Laundry - Armed forces training and education officer so that bending and twisting can be avoided.   Laundry - Unloading Dryer  Squat  down to reach into clothes dryer or use a reacher.  Gardening - Weeding / Psychiatric nurse or Kneel. Knee pads may be helpful.

## 2016-10-29 ENCOUNTER — Encounter: Payer: Self-pay | Admitting: Physical Therapy

## 2016-10-29 ENCOUNTER — Ambulatory Visit (INDEPENDENT_AMBULATORY_CARE_PROVIDER_SITE_OTHER): Payer: BLUE CROSS/BLUE SHIELD | Admitting: Physical Therapy

## 2016-10-29 DIAGNOSIS — M545 Low back pain: Secondary | ICD-10-CM

## 2016-10-29 DIAGNOSIS — G8929 Other chronic pain: Secondary | ICD-10-CM

## 2016-10-29 DIAGNOSIS — R29898 Other symptoms and signs involving the musculoskeletal system: Secondary | ICD-10-CM

## 2016-10-29 NOTE — Therapy (Signed)
[place], [place], [address] Phone: [phone]   Fax:  [phone]  Physical Therapy Treatment  Patient Details  Name: Alicia Davies MRN: [id number] Date of Birth: December 03, 1955 Referring Provider: Iran Planas, PA-C  Encounter Date: 10/29/2016      PT End of Session - 10/29/16 1018    Visit Number 2   Number of Visits 12   Date for PT Re-Evaluation 12/04/16   PT Start Time 3818   PT Stop Time 1114   PT Time Calculation (min) 56 min      Past Medical History:  Diagnosis Date  . Hyperparathyroidism   . Kidney stones     Past Surgical History:  Procedure Laterality Date  . ABDOMINAL HYSTERECTOMY    . APPENDECTOMY    . CHOLECYSTECTOMY    . FOOT SURGERY    . KNEE ARTHROSCOPY     Right and lft.   [name] PARATHYROIDECTOMY  04/2010   done by Dr Harlow Asa  . SHOULDER SURGERY      There were no vitals filed for this visit.      Subjective Assessment - 10/29/16 1018    Subjective (P)  Alicia Davies ordered a home TENs unit, it came in and she used it last night.  Had a lot of muscle spasms on Saturday night, used meds and heat to help it out.    Patient Stated Goals (P)  to be able to do activities she wants to do to enjoy life    Currently in Pain? (P)  Yes   Pain Score (P)  4    Pain Location (P)  Back   Pain Orientation (P)  Left;Lower   Pain Descriptors / Indicators (P)  Shooting   Pain Type (P)  Chronic pain   Pain Radiating Towards (P)  into Lt gluts   Pain Onset (P)  More than a month ago   Pain Frequency (P)  Constant   Aggravating Factors  (P)  bending forward all IDLs                         [facility] Adult PT Treatment/Exercise - 10/29/16 0001      Lumbar Exercises: Stretches   Single Knee to Chest Stretch 30 seconds   Lower Trunk Rotation 5 reps   Pelvic Tilt --  10 reps     Modalities   Modalities Electrical Stimulation;Moist Heat     Moist Heat Therapy   Number  Minutes Moist Heat 20 Minutes   Moist Heat Location Lumbar Spine  Lt buttocks     Electrical Stimulation   Electrical Stimulation Location Lt lumbar to Lt piriformis    Electrical Stimulation Action IFC    Electrical Stimulation Parameters to tolerance   Electrical Stimulation Goals Tone;Pain     Manual Therapy   Manual Therapy Soft tissue mobilization   Soft tissue mobilization STM to Lt lumbar paraspinals, gluts and piriformis, TPR to piriformis, & QL  tighter in the upper lumbar musculature          Trigger Point Dry Needling - 10/29/16 1048    Consent Given? Yes   Education Handout Provided Yes   Muscles Treated Upper Body Quadratus Lumborum;Longissimus;Gluteus maximus;Gluteus minimus;Piriformis   Muscles Treated Lower Body Gluteus maximus;Gluteus minimus;Piriformis   Longissimus Response Palpable increased muscle length;Twitch response elicited  Lt [id number]   Gluteus Maximus Response Palpable increased muscle length;Twitch response elicited  Lt   Gluteus Minimus Response  Palpable increased muscle length;Twitch response elicited  Lt   Piriformis Response Palpable increased muscle length;Twitch response elicited  Lt                   PT Long Term Goals - 10/29/16 1100      PT LONG TERM GOAL #1   Title I with advanced HEP 12/04/16   Status On-going     PT LONG TERM GOAL #2   Title perform bed mobility without pain  12/04/16   Status On-going     PT LONG TERM GOAL #3   Title report pain decrease by 50-75% with ADLs 12/04/16   Status On-going     PT LONG TERM GOAL #4   Title improve FOTO =/< 47% limited 12/04/16   Status On-going               Plan - 10/29/16 1100    Clinical Impression Statement This is Alicia Davies's second visit, no goals met.  She ambulates and moves in guarded postures and takes increased time due to her pain.  She had good releases with manual work however was sore afterwards.  Heat and stim helped settle some of it down.      Rehab Potential Good   Clinical Impairments Affecting Rehab Potential sedentary lifestyle; obesity; chronic nature of symptoms    PT Frequency 2x / week   PT Duration 6 weeks   PT Treatment/Interventions Patient/family education;ADLs/Self Care Home Management;Cryotherapy;Electrical Stimulation;Iontophoresis 12m/ml Dexamethasone;Moist Heat;Ultrasound;Dry needling;Manual techniques;Therapeutic activities;Therapeutic exercise;Neuromuscular re-education   PT Next Visit Plan asses response to DN and manual work, continue PRN and add in 3 part core.    Consulted and Agree with Plan of Care Patient      Patient will benefit from skilled therapeutic intervention in order to improve the following deficits and impairments:  Pain, Improper body mechanics, Postural dysfunction, Increased muscle spasms, Increased fascial restricitons, Decreased mobility, Decreased activity tolerance, Abnormal gait  Visit Diagnosis: Other symptoms and signs involving the musculoskeletal system  Chronic left-sided low back pain, with sciatica presence unspecified     Problem List Patient Active Problem List   Diagnosis Date Noted  . DDD (degenerative disc disease), lumbar 09/05/2016  . Viral upper respiratory infection 01/26/2016  . CKD (chronic kidney disease) stage 3, GFR 30-59 ml/min (HCC) 10/15/2015  . Seborrheic keratoses 07/14/2015  . Piriformis syndrome of left side 07/14/2015  . Morbid obesity ([place] 07/14/2015  . Hyperglycemia 06/10/2014  . Muscle spasm 03/22/2014  . S/P right knee arthroscopy 02/28/2014  . OSA (obstructive sleep apnea) 09/29/2013  . Depression 07/21/2013  . Essential hypertension, benign 07/21/2013  . Severe obesity (BMI >= 40) ([place] 07/21/2013  . Hyperlipidemia 07/21/2013  . Swelling of lower extremity 07/21/2013  . Benign cyst of left breast 10/28/2012  . Severe obstructive sleep apnea 10/12/2012  . Right knee pain 08/24/2010  . Primary [place]par[place]roidism ([place] 01/29/2010  .  POSTMENOPAUSAL STATUS 01/17/2010  . B12 DEFICIENCY 12/11/2009  . HYPERCALCEMIA 12/06/2009  . UNSPECIFIED VITAMIN D DEFICIENCY 12/05/2009  . OBESITY, UNSPECIFIED 12/05/2009  . MENOPAUSAL SYNDROME 12/05/2009  . FATIGUE 12/05/2009    SJeral PinchPT  10/29/2016, 11:02 AM  [facility] [address]Phone: [phone]  Fax:  [phone] Name: DBristal SteffyMRN: [id number] of Birth: [birth year]

## 2016-10-31 ENCOUNTER — Encounter: Payer: BLUE CROSS/BLUE SHIELD | Admitting: Physical Therapy

## 2016-11-01 ENCOUNTER — Encounter: Payer: BLUE CROSS/BLUE SHIELD | Admitting: Rehabilitative and Restorative Service Providers"

## 2016-11-05 ENCOUNTER — Ambulatory Visit (INDEPENDENT_AMBULATORY_CARE_PROVIDER_SITE_OTHER): Payer: BLUE CROSS/BLUE SHIELD | Admitting: Physical Therapy

## 2016-11-05 ENCOUNTER — Encounter: Payer: Self-pay | Admitting: Physical Therapy

## 2016-11-05 DIAGNOSIS — R29898 Other symptoms and signs involving the musculoskeletal system: Secondary | ICD-10-CM | POA: Diagnosis not present

## 2016-11-05 DIAGNOSIS — G8929 Other chronic pain: Secondary | ICD-10-CM | POA: Diagnosis not present

## 2016-11-05 DIAGNOSIS — M545 Low back pain: Secondary | ICD-10-CM | POA: Diagnosis not present

## 2016-11-05 NOTE — Therapy (Signed)
[place], [place], [address] Phone: [phone]   Fax:  [phone]  Physical Therapy Treatment  Patient Details  Name: Alicia Davies MRN: [id number] Date of Birth: January 05, 1956 Referring Provider: Iran Planas, PA-C  Encounter Date: 11/05/2016      PT End of Session - 11/05/16 0934    Visit Number 3   Number of Visits 12   Date for PT Re-Evaluation 12/04/16   PT Start Time 0934   PT Stop Time 1036   PT Time Calculation (min) 62 min   Activity Tolerance Patient tolerated treatment well      Past Medical History:  Diagnosis Date  . Hyperparathyroidism   . Kidney stones     Past Surgical History:  Procedure Laterality Date  . ABDOMINAL HYSTERECTOMY    . APPENDECTOMY    . CHOLECYSTECTOMY    . FOOT SURGERY    . KNEE ARTHROSCOPY     Right and lft.   [name] PARATHYROIDECTOMY  04/2010   done by Dr Harlow Asa  . SHOULDER SURGERY      There were no vitals filed for this visit.      Subjective Assessment - 11/05/16 0935    Subjective Alicia Davies reports she is still having back pain, was very sore for a day after her last session.  She states the pain was down slightly for about a day.    Patient Stated Goals to be able to do activities she wants to do to enjoy life    Currently in Pain? Yes   Pain Score 4    Pain Location Back   Pain Orientation Left;Lower   Pain Descriptors / Indicators Constant;Sharp   Pain Type Chronic pain   Pain Onset More than a month ago   Pain Frequency Constant   Aggravating Factors  bending over for ADLs   Pain Relieving Factors TENs                         [facility] Adult PT Treatment/Exercise - 11/05/16 0001      Self-Care   Self-Care ADL's   ADL's reviewed body mechanics with most ADLs and safe movement patterns, using handouts     Lumbar Exercises: Stretches   Pelvic Tilt --  15 reps     Modalities   Modalities Electrical Stimulation;Moist Heat      Moist Heat Therapy   Number Minutes Moist Heat 20 Minutes   Moist Heat Location Lumbar Spine     Electrical Stimulation   Electrical Stimulation Location Lt lumbar to Lt piriformis    Electrical Stimulation Action IFC   Electrical Stimulation Parameters to tolerance   Electrical Stimulation Goals Tone;Pain     Manual Therapy   Manual Therapy Joint mobilization;Soft tissue mobilization   Joint Mobilization grade III lumbar CPA and Lt UPA mobs   Soft tissue mobilization STM to Lt lumbar paraspinals, gluts and piriformis, TPR to piriformis, & QL  tighter in the upper lumbar musculature          Trigger Point Dry Needling - 11/05/16 1010    Consent Given? Yes   Education Handout Provided No   Muscles Treated Upper Body Quadratus Lumborum   Muscles Treated Lower Body Gluteus maximus  Lt   Longissimus Response Twitch response elicited;Palpable increased muscle length  Lt L4-1   Gluteus Maximus Response Twitch response elicited;Palpable increased muscle length  PT Long Term Goals - 11/05/16 1232      PT LONG TERM GOAL #1   Title I with advanced HEP 12/04/16   Status On-going     PT LONG TERM GOAL #2   Title perform bed mobility without pain  12/04/16   Status On-going     PT LONG TERM GOAL #3   Title report pain decrease by 50-75% with ADLs 12/04/16   Status On-going     PT LONG TERM GOAL #4   Title improve FOTO =/< 47% limited 12/04/16   Status On-going               Plan - 11/05/16 1230    Clinical Impression Statement Alicia Davies continues to have a lot of low back pain, she does think it may be a little better.  It was improved for a bout a day after the last treatment. The muscles in her buttocks are much less tight, the paraspinals are still very tight.  No goals met, only the third visit.    Rehab Potential Good   Clinical Impairments Affecting Rehab Potential sedentary lifestyle; obesity; chronic nature of symptoms    PT  Frequency 2x / week   PT Duration 6 weeks   PT Treatment/Interventions Patient/family education;ADLs/Self Care Home Management;Cryotherapy;Electrical Stimulation;Iontophoresis 74m/ml Dexamethasone;Moist Heat;Ultrasound;Dry needling;Manual techniques;Therapeutic activities;Therapeutic exercise;Neuromuscular re-education   PT Next Visit Plan asses response to DN and manual work, continue PRN and add in 3 part core.    Consulted and Agree with Plan of Care Patient      Patient will benefit from skilled therapeutic intervention in order to improve the following deficits and impairments:  Pain, Improper body mechanics, Postural dysfunction, Increased muscle spasms, Increased fascial restricitons, Decreased mobility, Decreased activity tolerance, Abnormal gait  Visit Diagnosis: Other symptoms and signs involving the musculoskeletal system  Chronic left-sided low back pain, with sciatica presence unspecified     Problem List Patient Active Problem List   Diagnosis Date Noted  . DDD (degenerative disc disease), lumbar 09/05/2016  . Viral upper respiratory infection 01/26/2016  . CKD (chronic kidney disease) stage 3, GFR 30-59 ml/min (HCC) 10/15/2015  . Seborrheic keratoses 07/14/2015  . Piriformis syndrome of left side 07/14/2015  . Morbid obesity ([place] 07/14/2015  . Hyperglycemia 06/10/2014  . Muscle spasm 03/22/2014  . S/P right knee arthroscopy 02/28/2014  . OSA (obstructive sleep apnea) 09/29/2013  . Depression 07/21/2013  . Essential hypertension, benign 07/21/2013  . Severe obesity (BMI >= 40) ([place] 07/21/2013  . Hyperlipidemia 07/21/2013  . Swelling of lower extremity 07/21/2013  . Benign cyst of left breast 10/28/2012  . Severe obstructive sleep apnea 10/12/2012  . Right knee pain 08/24/2010  . Primary hyperparathyroidism ([place] 01/29/2010  . POSTMENOPAUSAL STATUS 01/17/2010  . B12 DEFICIENCY 12/11/2009  . HYPERCALCEMIA 12/06/2009  . UNSPECIFIED VITAMIN D DEFICIENCY  12/05/2009  . OBESITY, UNSPECIFIED 12/05/2009  . MENOPAUSAL SYNDROME 12/05/2009  . FATIGUE 12/05/2009    SJeral PinchPT  11/05/2016, 12:33 PM  [facility] [address]Phone: [phone]  Fax:  [phone] Name: Alicia MouserMRN: [id number] of Birth: 106-01-1955

## 2016-11-05 NOTE — Patient Instructions (Addendum)
   Avoid Twisting    Avoid twisting or bending back. Pivot around using foot movements, and bend at knees if needed when reaching for articles.   Copyright  VHI. All rights reserved.  Brushing Teeth     Place one foot on ledge and one hand on counter. Bend other knee slightly to keep back straight.  Copyright  VHI. All rights reserved.  Telephone     Avoid leaning phone on shoulder. Take time to assume proper position.  Copyright  VHI. All rights reserved.  Laundry - Unloading Wash    To unload small items at bottom of washer, lift leg opposite to arm being used to reach.   Copyright  VHI. All rights reserved.  Laundry - Nature conservation officerLoading Wash    Position laundry basket so that bending and twisting can be avoided.   Copyright  VHI. All rights reserved.  Housework - Vacuuming    Hold the vacuum with arm held at side. Step back and forth to move it, keeping head up. Avoid twisting.   Copyright  VHI. All rights reserved.  Housework - Reaching Down    If you are unable to bend your knees or squat, use a lazy Darl PikesSusan to keep items within easy reach. Store only light, unbreakable items on the lowest shelves, and use a reacher to pick them up.  Copyright  VHI. All rights reserved.  Housework - Sweeping    Use long-handled equipment to avoid stooping.   Copyright  VHI. All rights reserved.  Housework - Tub Scrub    Kneel down and use long-handled sponge or brush to reach.   Copyright  VHI. All rights reserved.

## 2016-11-08 ENCOUNTER — Ambulatory Visit (INDEPENDENT_AMBULATORY_CARE_PROVIDER_SITE_OTHER): Payer: BLUE CROSS/BLUE SHIELD | Admitting: Physical Therapy

## 2016-11-08 ENCOUNTER — Encounter: Payer: Self-pay | Admitting: Physical Therapy

## 2016-11-08 DIAGNOSIS — G8929 Other chronic pain: Secondary | ICD-10-CM

## 2016-11-08 DIAGNOSIS — M545 Low back pain: Secondary | ICD-10-CM | POA: Diagnosis not present

## 2016-11-08 DIAGNOSIS — R29898 Other symptoms and signs involving the musculoskeletal system: Secondary | ICD-10-CM | POA: Diagnosis not present

## 2016-11-08 NOTE — Therapy (Signed)
[facility] [address] [place], [place], [address] Phone: [phone]   Fax:  [phone]  Physical Therapy Treatment  Patient Details  Name: Alicia BossDebra Davies MRN: [id number] Date of Birth: July 09, 1955 Referring Provider: Tandy GawJade Breeback, PA-C  Encounter Date: 11/08/2016      PT End of Session - 11/08/16 1022    Visit Number 4   Number of Visits 12   Date for PT Re-Evaluation 12/04/16   PT Start Time 1021   PT Stop Time 1116   PT Time Calculation (min) 55 min      Past Medical History:  Diagnosis Date  . Hyperparathyroidism   . Kidney stones     Past Surgical History:  Procedure Laterality Date  . ABDOMINAL HYSTERECTOMY    . APPENDECTOMY    . CHOLECYSTECTOMY    . FOOT SURGERY    . KNEE ARTHROSCOPY     Right and lft.   [name]. PARATHYROIDECTOMY  04/2010   done by Dr Gerrit FriendsGerkin  . SHOULDER SURGERY      There were no vitals filed for this visit.      Subjective Assessment - 11/08/16 1022    Subjective Susette feels like the spasms in her back are starting to settle down, however still having pain.    Patient Stated Goals to be able to do activities she wants to do to enjoy life    Currently in Pain? Yes   Pain Score 4    Pain Location Back   Pain Orientation Left;Lower   Pain Descriptors / Indicators Aching   Pain Type Chronic pain   Pain Onset More than a month ago   Pain Frequency Constant                         [facility] Adult PT Treatment/Exercise - 11/08/16 0001      Lumbar Exercises: Stretches   Lower Trunk Rotation 20 seconds;2 reps   Pelvic Tilt --  15 reps     Modalities   Modalities Electrical Stimulation;Moist Heat     Moist Heat Therapy   Number Minutes Moist Heat 20 Minutes   Moist Heat Location Lumbar Spine     Electrical Stimulation   Electrical Stimulation Location Lt lumbar to Lt piriformis    Electrical Stimulation Action IFC   Electrical Stimulation Parameters to  tolerance   Electrical Stimulation Goals Tone;Pain     Manual Therapy   Manual Therapy Soft tissue mobilization   Soft tissue mobilization STM to Lt low back with TRP to QL and upper glluts          Trigger Point Dry Needling - 11/08/16 1023    Consent Given? Yes   Education Handout Provided No   Muscles Treated Upper Body Quadratus Lumborum;Longissimus;Gluteus maximus   Longissimus Response Palpable increased muscle length;Twitch response elicited  Lt 3-T12 with stim   Gluteus Maximus Response Palpable increased muscle length;Twitch response elicited  Lt   Gluteus Minimus Response Palpable increased muscle length;Twitch response elicited  Lt                   PT Long Term Goals - 11/05/16 1232      PT LONG TERM GOAL #1   Title I with advanced HEP 12/04/16   Status On-going     PT LONG TERM GOAL #2   Title perform bed mobility without pain  12/04/16   Status On-going     PT LONG TERM  GOAL #3   Title report pain decrease by 50-75% with ADLs 12/04/16   Status On-going     PT LONG TERM GOAL #4   Title improve FOTO =/< 47% limited 12/04/16   Status On-going               Plan - 11/08/16 1041    Clinical Impression Statement Skii reports small improvement in her back spasms, still having pain.  Her muscles are more pliable, she is very tender in her upper gluts and QL.  She responded well to treatment with some decrease in pain. She is transitioning from one position to another with slightly increased speed and ease   Rehab Potential Good   PT Frequency 2x / week   PT Duration 6 weeks   PT Treatment/Interventions Patient/family education;ADLs/Self Care Home Management;Cryotherapy;Electrical Stimulation;Iontophoresis 4mg /ml Dexamethasone;Moist Heat;Ultrasound;Dry needling;Manual techniques;Therapeutic activities;Therapeutic exercise;Neuromuscular re-education   PT Next Visit Plan asses response to DN and manual work, continue PRN and add in 3 part core.     Consulted and Agree with Plan of Care Patient      Patient will benefit from skilled therapeutic intervention in order to improve the following deficits and impairments:  Pain, Improper body mechanics, Postural dysfunction, Increased muscle spasms, Increased fascial restricitons, Decreased mobility, Decreased activity tolerance, Abnormal gait  Visit Diagnosis: Other symptoms and signs involving the musculoskeletal system  Chronic left-sided low back pain, with sciatica presence unspecified     Problem List Patient Active Problem List   Diagnosis Date Noted  . DDD (degenerative disc disease), lumbar 09/05/2016  . Viral upper respiratory infection 01/26/2016  . CKD (chronic kidney disease) stage 3, GFR 30-59 ml/min (HCC) 10/15/2015  . Seborrheic keratoses 07/14/2015  . Piriformis syndrome of left side 07/14/2015  . Morbid obesity (HCC) 07/14/2015  . Hyperglycemia 06/10/2014  . Muscle spasm 03/22/2014  . S/P right knee arthroscopy 02/28/2014  . OSA (obstructive sleep apnea) 09/29/2013  . Depression 07/21/2013  . Essential hypertension, benign 07/21/2013  . Severe obesity (BMI >= 40) (HCC) 07/21/2013  . Hyperlipidemia 07/21/2013  . Swelling of lower extremity 07/21/2013  . Benign cyst of left breast 10/28/2012  . Severe obstructive sleep apnea 10/12/2012  . Right knee pain 08/24/2010  . Primary hyperparathyroidism (HCC) 01/29/2010  . POSTMENOPAUSAL STATUS 01/17/2010  . B12 DEFICIENCY 12/11/2009  . HYPERCALCEMIA 12/06/2009  . UNSPECIFIED VITAMIN D DEFICIENCY 12/05/2009  . OBESITY, UNSPECIFIED 12/05/2009  . MENOPAUSAL SYNDROME 12/05/2009  . FATIGUE 12/05/2009    Roderic Scarce PT  11/08/2016, 10:53 AM  [facility] [address] [place], [place], [address] Phone: [phone]   Fax:  [phone]  Name: Alicia Davies MRN: [id number] Date of Birth: 09-02-1955

## 2016-11-11 ENCOUNTER — Ambulatory Visit (INDEPENDENT_AMBULATORY_CARE_PROVIDER_SITE_OTHER): Payer: BLUE CROSS/BLUE SHIELD | Admitting: Rehabilitative and Restorative Service Providers"

## 2016-11-11 ENCOUNTER — Encounter: Payer: Self-pay | Admitting: Rehabilitative and Restorative Service Providers"

## 2016-11-11 DIAGNOSIS — M545 Low back pain: Secondary | ICD-10-CM

## 2016-11-11 DIAGNOSIS — R29898 Other symptoms and signs involving the musculoskeletal system: Secondary | ICD-10-CM

## 2016-11-11 DIAGNOSIS — G8929 Other chronic pain: Secondary | ICD-10-CM

## 2016-11-11 NOTE — Patient Instructions (Addendum)
Tighten core with all exercises! Back Wall Slide    With feet __8-10__ inches from wall, lean as much of back against the wall as possible. Gently squat down __8-10_ inches, keeping back against wall. Hold __5__ seconds while counting out loud.  Repeat _10___ times. Do _2-3___ sessions per day. Strengthening: Hip Adduction - Isometric    With ball or folded pillow between knees, squeeze knees together. Hold __2-5__ seconds. Repeat _10___ times per set. Do _1-2___ sets per session. Do __1_ sessions per day.    Strengthening: Hip Abductor - Resisted    With belt looped around both legs above knees, tense legs pushing out. Hold 2-3 sec Repeat __10__ times per set. Do __1-2__ sets per session. Do __1__ sessions per day.

## 2016-11-11 NOTE — Therapy (Signed)
[facility] [place] [address] [place], [place], [address] Phone: [phone]   Fax:  [phone]  Physical Therapy Treatment  Patient Details  Name: Alicia Davies MRN: [id number] Date of Birth: [birth year] Referring Provider: Tandy Gaw, PA-C  Encounter Date: 11/11/2016      PT End of Session - 11/11/16 1020    Visit Number 5   Number of Visits 12   Date for PT Re-Evaluation 12/04/16   PT Start Time 1019   PT Stop Time 1115   PT Time Calculation (min) 56 min   Activity Tolerance Patient tolerated treatment well      Past Medical History:  Diagnosis Date  . Hyperparathyroidism   . Kidney stones     Past Surgical History:  Procedure Laterality Date  . ABDOMINAL HYSTERECTOMY    . APPENDECTOMY    . CHOLECYSTECTOMY    . FOOT SURGERY    . KNEE ARTHROSCOPY     Right and lft.   [name] PARATHYROIDECTOMY  04/2010   done by Dr Gerrit Friends  . SHOULDER SURGERY      There were no vitals filed for this visit.      Subjective Assessment - 11/11/16 1021    Subjective Alicia Davies was improving but noticed increased pain in the LB and difficulty turning over during the night. She has had pain with turning over at night but it had been improving some until last night.  May have done more over the weekend than usual.    Currently in Pain? Yes   Pain Score 5    Pain Location Back   Pain Orientation Left;Lower   Pain Descriptors / Indicators Aching;Nagging   Pain Type Chronic pain   Pain Onset More than a month ago   Pain Frequency Constant   Aggravating Factors  bending forward; turning over in bed   Pain Relieving Factors TENS unit                          [facility] Adult PT Treatment/Exercise - 11/11/16 0001      Lumbar Exercises: Stretches   Single Knee to Chest Stretch 30 seconds;3 reps  each side    Piriformis Stretch 3 reps;30 seconds  modified due to pt's weight supine with strap Lt only      Lumbar Exercises:  Aerobic   Stationary Bike Nustep L5 x 5 min      Lumbar Exercises: Standing   Wall Slides 10 reps  core engaged     Lumbar Exercises: Supine   Clam Limitations isometric hip abduction against green strap 2 sec hold x 10 reps in hooklying    Other Supine Lumbar Exercises 3 part core 10 sec x 10    Other Supine Lumbar Exercises isometric hip adduction PT resistance 2 sec x 10 reps in hooklying      Modalities   Modalities Electrical Stimulation;Moist Heat     Moist Heat Therapy   Number Minutes Moist Heat 20 Minutes   Moist Heat Location Lumbar Spine     Electrical Stimulation   Electrical Stimulation Location Lt lumbar to Lt piriformis    Electrical Stimulation Action IFC   Electrical Stimulation Parameters to tolerance   Electrical Stimulation Goals Tone;Pain     Manual Therapy   Manual Therapy Soft tissue mobilization   Manual therapy comments pt prone    Soft tissue mobilization deep tissue work through the Lt lumbar and QL areas into Lt posterior hip/pirifirmis/hip  abductors           Trigger Point Dry Needling - 11/11/16 1056    Consent Given? Yes   Muscles Treated Upper Body Longissimus;Quadratus Lumborum   Muscles Treated Lower Body Gluteus minimus;Gluteus maximus;Piriformis   Longissimus Response Palpable increased muscle length  Lt lumbar    Gluteus Maximus Response Palpable increased muscle length  Lt with estim    Gluteus Minimus Response Palpable increased muscle length  Lt with estim    Piriformis Response Palpable increased muscle length  Lt with estim               PT Education - 11/11/16 1109    Education provided Yes   Education Details HEP    Person(s) Educated Patient   Methods Explanation;Demonstration;Tactile cues;Verbal cues;Handout   Comprehension Verbalized understanding;Returned demonstration;Verbal cues required;Tactile cues required             PT Long Term Goals - 11/11/16 1021      PT LONG TERM GOAL #1   Title I with  advanced HEP 12/04/16   Time 6   Period Weeks   Status On-going     PT LONG TERM GOAL #2   Title perform bed mobility without pain  12/04/16   Time 6   Period Weeks   Status On-going     PT LONG TERM GOAL #3   Title report pain decrease by 50-75% with ADLs 12/04/16   Time 6   Period Weeks   Status On-going     PT LONG TERM GOAL #4   Title improve FOTO =/< 47% limited 12/04/16   Time 6   Period Weeks   Status On-going               Plan - 11/11/16 1026    Clinical Impression Statement Flare up of symptoms reported today. She has increased pain in the Lt LB and hip. She has palpable tightness through the Lt lumbar and buttock area. Responded well to DN and manual work as well as gentle exercise/movement.    Rehab Potential Good   Clinical Impairments Affecting Rehab Potential sedentary lifestyle; obesity; chronic nature of symptoms    PT Frequency 2x / week   PT Duration 6 weeks   PT Treatment/Interventions Patient/family education;ADLs/Self Care Home Management;Cryotherapy;Electrical Stimulation;Iontophoresis 4mg /ml Dexamethasone;Moist Heat;Ultrasound;Dry needling;Manual techniques;Therapeutic activities;Therapeutic exercise;Neuromuscular re-education   PT Next Visit Plan continue DN and manual work as indicated, continue core stabilization    Consulted and Agree with Plan of Care Patient      Patient will benefit from skilled therapeutic intervention in order to improve the following deficits and impairments:  Pain, Improper body mechanics, Postural dysfunction, Increased muscle spasms, Increased fascial restricitons, Decreased mobility, Decreased activity tolerance, Abnormal gait  Visit Diagnosis: Other symptoms and signs involving the musculoskeletal system  Chronic left-sided low back pain, with sciatica presence unspecified     Problem List Patient Active Problem List   Diagnosis Date Noted  . DDD (degenerative disc disease), lumbar 09/05/2016  . Viral  upper respiratory infection 01/26/2016  . CKD (chronic kidney disease) stage 3, GFR 30-59 ml/min (HCC) 10/15/2015  . Seborrheic keratoses 07/14/2015  . Piriformis syndrome of left side 07/14/2015  . Morbid obesity (HCC) 07/14/2015  . Hyperglycemia 06/10/2014  . Muscle spasm 03/22/2014  . S/P right knee arthroscopy 02/28/2014  . OSA (obstructive sleep apnea) 09/29/2013  . Depression 07/21/2013  . Essential hypertension, benign 07/21/2013  . Severe obesity (BMI >= 40) (HCC) 07/21/2013  . Hyperlipidemia  07/21/2013  . Swelling of lower extremity 07/21/2013  . Benign cyst of left breast 10/28/2012  . Severe obstructive sleep apnea 10/12/2012  . Right knee pain 08/24/2010  . Primary hyperparathyroidism (HCC) 01/29/2010  . POSTMENOPAUSAL STATUS 01/17/2010  . B12 DEFICIENCY 12/11/2009  . HYPERCALCEMIA 12/06/2009  . UNSPECIFIED VITAMIN D DEFICIENCY 12/05/2009  . OBESITY, UNSPECIFIED 12/05/2009  . MENOPAUSAL SYNDROME 12/05/2009  . FATIGUE 12/05/2009    Vaishali Baise Rober Minion PT, MPH  11/11/2016, 11:11 AM  [facility] [address] [place], [place], [address] Phone: [phone]   Fax:  [phone]  Name: Alicia Davies MRN: [id number] Date of Birth: Feb 27, 1955

## 2016-11-15 ENCOUNTER — Encounter: Payer: Self-pay | Admitting: Rehabilitative and Restorative Service Providers"

## 2016-11-15 ENCOUNTER — Ambulatory Visit (INDEPENDENT_AMBULATORY_CARE_PROVIDER_SITE_OTHER): Payer: BLUE CROSS/BLUE SHIELD | Admitting: Rehabilitative and Restorative Service Providers"

## 2016-11-15 DIAGNOSIS — M545 Low back pain: Secondary | ICD-10-CM | POA: Diagnosis not present

## 2016-11-15 DIAGNOSIS — G8929 Other chronic pain: Secondary | ICD-10-CM

## 2016-11-15 DIAGNOSIS — R29898 Other symptoms and signs involving the musculoskeletal system: Secondary | ICD-10-CM

## 2016-11-15 NOTE — Patient Instructions (Addendum)
WALKING  Walking is a great form of exercise to increase your strength, endurance and overall fitness.  A walking program can help you start slowly and gradually build endurance as you go.  Everyone's ability is different, so each person's starting point will be different.  You do not have to follow them exactly.  The are just samples. You should simply find out what's right for you and stick to that program.   In the beginning, you'll start off walking 2-3 times a day for short distances.  As you get stronger, you'll be walking further at just 1-2 times per day.  A. You Can Walk For A Certain Length Of Time Each Day    Walk 5 minutes 3 times per day.  Increase 2 minutes every 2 days (3 times per day).  Work up to 25-30 minutes (1-2 times per day).   Example:   Day 1-2 5 minutes 3 times per day   Day 7-8 12 minutes 2-3 times per day   Day 13-14 25 minutes 1-2 times per day  B. You Can Walk For a Certain Distance Each Day     Distance can be substituted for time.    Example:   3 trips to mailbox (at road)   3 trips to corner of block   3 trips around the block  C. Go to local [employer] and use the track.    Walk for distance ____ around track  Or time ____ minutes  D. Walk ____ Jog ____ Run ___  Please only do the exercises that your therapist has initialed and dated   Low Row: Standing   Face anchor, feet shoulder width apart. Palms up, pull arms back, squeezing shoulder blades together. Repeat 10__ times per set. Do 2-3__ sets per session. Do 2-3__ sessions per day Anchor Height: Waist   Strengthening: Resisted Extension   Hold tubing in right hand, arm forward. Pull arm back, elbow straight. Repeat _10___ times per set. Do 2-3____ sets per session. Do 2-3____ sessions per day.  Extremity Flexion (Hook-Lying)    Tighten stomach and slowly lower right arm over head until back begins to arch. Keep trunk rigid. Repeat __10-20__ times per set. Do _1-2___ sets per  session. Do _1___ sessions per day.    Bent Leg Lift (Hook-Lying)    Tighten stomach and slowly raise right leg __10-12__ inches from floor. Keep trunk rigid. Hold __2__ seconds. Repeat __10-20__ times per set. Do __1-2__ sets per session. Do __1__ sessions per day.    Combination (Hook-Lying)    Tighten stomach and slowly raise left leg and lower opposite arm over head. Keep trunk rigid. Repeat _10-20___ times per set. Do ___1-2_ sets per session. Do _1___ sessions per day.

## 2016-11-15 NOTE — Therapy (Signed)
[facility] [address] [place], [place], [address] Phone: [phone]   Fax:  [phone]  Physical Therapy Treatment  Patient Details  Name: Alicia Davies MRN: [id number] Date of Birth: [birth year] Referring Provider: Tandy GawJade Breeback, PA-C  Encounter Date: 11/15/2016      PT End of Session - 11/15/16 1154    Visit Number 6   Number of Visits 12   Date for PT Re-Evaluation 12/04/16   PT Start Time 1151   PT Stop Time 1245   PT Time Calculation (min) 54 min   Activity Tolerance Patient tolerated treatment well      Past Medical History:  Diagnosis Date  . Hyperparathyroidism   . Kidney stones     Past Surgical History:  Procedure Laterality Date  . ABDOMINAL HYSTERECTOMY    . APPENDECTOMY    . CHOLECYSTECTOMY    . FOOT SURGERY    . KNEE ARTHROSCOPY     Right and lft.   [name]. PARATHYROIDECTOMY  04/2010   done by Dr Gerrit FriendsGerkin  . SHOULDER SURGERY      There were no vitals filed for this visit.      Subjective Assessment - 11/15/16 1157    Subjective Alicia Davies reports that she is feeling a little better than she was the last time she was here. She has pain with turning over in bed; loading or unloading the dish washer; doing functional activities at home. She is doing her exercises at home. She has not started a walking program yet.    Currently in Pain? Yes   Pain Score 3    Pain Location Back   Pain Orientation Left;Lower   Pain Descriptors / Indicators Aching;Nagging   Pain Type Chronic pain   Pain Onset More than a month ago                         [facility] Adult PT Treatment/Exercise - 11/15/16 0001      Lumbar Exercises: Stretches   Lower Trunk Rotation 20 seconds;2 reps   Piriformis Stretch 3 reps;30 seconds  modified due to pt's weight supine with strap Lt only      Lumbar Exercises: Aerobic   Stationary Bike Nustep L5 x 5 min      Lumbar Exercises: Standing   Wall Slides 10 reps  core  engaged   Row Strengthening;Both;20 reps   Theraband Level (Row) Level 3 (Green)   Shoulder Extension Strengthening;Both;20 reps;Theraband   Theraband Level (Shoulder Extension) Level 3 (Green)     Lumbar Exercises: Supine   Clam Limitations isometric hip abduction against green strap 2 sec hold x 20 reps in hooklying    Bent Knee Raise 15 reps  core engaged   Bridge --  pain - unable to do   Other Supine Lumbar Exercises dead bug x 5 each LE    Other Supine Lumbar Exercises isometric hip adduction PT resistance 2 sec x 20 reps in hooklying      Modalities   Modalities Electrical Stimulation;Moist Heat     Moist Heat Therapy   Number Minutes Moist Heat 20 Minutes   Moist Heat Location Lumbar Spine     Electrical Stimulation   Electrical Stimulation Location Lt lumbar to Lt piriformis    Electrical Stimulation Action IFC   Electrical Stimulation Parameters to tolerance   Electrical Stimulation Goals Tone;Pain     Manual Therapy   Manual Therapy Soft tissue mobilization   Manual  therapy comments pt prone    Soft tissue mobilization deep tissue work through the Lt lumbar and QL areas into Lt posterior hip/pirifirmis/hip abductors           Trigger Point Dry Needling - 11/15/16 1229    Consent Given? Yes   Muscles Treated Lower Body --  Lt with estim    Gluteus Maximus Response Palpable increased muscle length   Gluteus Minimus Response Palpable increased muscle length   Piriformis Response Palpable increased muscle length              PT Education - 11/15/16 1203    Education provided Yes   Education Details HEP    Person(s) Educated Patient   Methods Explanation;Demonstration;Tactile cues;Verbal cues;Handout   Comprehension Verbalized understanding;Returned demonstration;Verbal cues required;Tactile cues required             PT Long Term Goals - 11/11/16 1021      PT LONG TERM GOAL #1   Title I with advanced HEP 12/04/16   Time 6   Period Weeks    Status On-going     PT LONG TERM GOAL #2   Title perform bed mobility without pain  12/04/16   Time 6   Period Weeks   Status On-going     PT LONG TERM GOAL #3   Title report pain decrease by 50-75% with ADLs 12/04/16   Time 6   Period Weeks   Status On-going     PT LONG TERM GOAL #4   Title improve FOTO =/< 47% limited 12/04/16   Time 6   Period Weeks   Status On-going               Plan - 11/15/16 1200    Clinical Impression Statement some improvement in symptoms reported today. Alicia Davies reports good pain relief in LB and Lt hip pain with DN. She continues to have palpable tightness through the Lt posterior ship area. Sedentary lifestyle. Encouraged to work on a walking program.    Rehab Potential Good   Clinical Impairments Affecting Rehab Potential sedentary lifestyle; obesity; chronic nature of symptoms    PT Frequency 2x / week   PT Duration 6 weeks   PT Treatment/Interventions Patient/family education;ADLs/Self Care Home Management;Cryotherapy;Electrical Stimulation;Iontophoresis 4mg /ml Dexamethasone;Moist Heat;Ultrasound;Dry needling;Manual techniques;Therapeutic activities;Therapeutic exercise;Neuromuscular re-education   PT Next Visit Plan continue DN and manual work as indicated, continue core stabilization    Consulted and Agree with Plan of Care Patient      Patient will benefit from skilled therapeutic intervention in order to improve the following deficits and impairments:  Pain, Improper body mechanics, Postural dysfunction, Increased muscle spasms, Increased fascial restricitons, Decreased mobility, Decreased activity tolerance, Abnormal gait  Visit Diagnosis: Other symptoms and signs involving the musculoskeletal system  Chronic left-sided low back pain, with sciatica presence unspecified     Problem List Patient Active Problem List   Diagnosis Date Noted  . DDD (degenerative disc disease), lumbar 09/05/2016  . Viral upper respiratory infection  01/26/2016  . CKD (chronic kidney disease) stage 3, GFR 30-59 ml/min (HCC) 10/15/2015  . Seborrheic keratoses 07/14/2015  . Piriformis syndrome of left side 07/14/2015  . Morbid obesity (HCC) 07/14/2015  . Hyperglycemia 06/10/2014  . Muscle spasm 03/22/2014  . S/P right knee arthroscopy 02/28/2014  . OSA (obstructive sleep apnea) 09/29/2013  . Depression 07/21/2013  . Essential hypertension, benign 07/21/2013  . Severe obesity (BMI >= 40) (HCC) 07/21/2013  . Hyperlipidemia 07/21/2013  . Swelling of lower extremity  07/21/2013  . Benign cyst of left breast 10/28/2012  . Severe obstructive sleep apnea 10/12/2012  . Right knee pain 08/24/2010  . Primary hyperparathyroidism (HCC) 01/29/2010  . POSTMENOPAUSAL STATUS 01/17/2010  . B12 DEFICIENCY 12/11/2009  . HYPERCALCEMIA 12/06/2009  . UNSPECIFIED VITAMIN D DEFICIENCY 12/05/2009  . OBESITY, UNSPECIFIED 12/05/2009  . MENOPAUSAL SYNDROME 12/05/2009  . FATIGUE 12/05/2009    Celyn Rober Minion PT, MPH  11/15/2016, 12:31 PM  [facility] [address] [place], [place], [address] Phone: [phone]   Fax:  [phone]  Name: Eleina Jergens MRN: [id number] Date of Birth: [birth year]

## 2016-11-19 ENCOUNTER — Ambulatory Visit: Payer: BLUE CROSS/BLUE SHIELD | Admitting: Rehabilitative and Restorative Service Providers"

## 2016-11-19 ENCOUNTER — Encounter: Payer: Self-pay | Admitting: Rehabilitative and Restorative Service Providers"

## 2016-11-19 DIAGNOSIS — G8929 Other chronic pain: Secondary | ICD-10-CM | POA: Diagnosis not present

## 2016-11-19 DIAGNOSIS — R29898 Other symptoms and signs involving the musculoskeletal system: Secondary | ICD-10-CM | POA: Diagnosis not present

## 2016-11-19 DIAGNOSIS — M545 Low back pain: Secondary | ICD-10-CM

## 2016-11-19 NOTE — Patient Instructions (Addendum)
Strengthening: Hip Abduction - Resisted    With tubing around right leg above knees, extend leg out from side leading with heel. Core tight  Repeat _10___ times per set. Do __2-3__ sets per session. Do __2__ sessions per day.    Strengthening: Hip Extension - Resisted    With tubing above knees, pull leg straight back. Core tight Repeat __10__ times per set. Do _2-3___ sets per session. Do __2__ sessions per day.    Functional Quadriceps: Chair Squat    Keeping feet flat on floor, shoulder width apart, little knee bend. Core tight. Use support as necessary. Repeat __10__ times per set. Do __2-3__ sets per session. Do ___2_ sessions per day.    WALKING  Walking is a great form of exercise to increase your strength, endurance and overall fitness.  A walking program can help you start slowly and gradually build endurance as you go.  Everyone's ability is different, so each person's starting point will be different.  You do not have to follow them exactly.  The are just samples. You should simply find out what's right for you and stick to that program.   In the beginning, you'll start off walking 2-3 times a day for short distances.  As you get stronger, you'll be walking further at just 1-2 times per day.  A. You Can Walk For A Certain Length Of Time Each Day    Walk 5 minutes 3 times per day.  Increase 2 minutes every 2 days (3 times per day).  Work up to 25-30 minutes (1-2 times per day).   Example:   Day 1-2 5 minutes 3 times per day   Day 7-8 12 minutes 2-3 times per day   Day 13-14 25 minutes 1-2 times per day  B. You Can Walk For a Certain Distance Each Day     Distance can be substituted for time.    Example:   3 trips to mailbox (at road)   3 trips to corner of block   3 trips around the block  C. Go to local [employer] and use the track.    Walk for distance ____ around track  Or time ____ minutes  D. Walk ____ Jog ____ Run ___  Please only do the  exercises that your therapist has initialed and dated

## 2016-11-19 NOTE — Therapy (Signed)
[facility] [address] [place], [place], [address] Phone: [phone]   Fax:  [phone]  Physical Therapy Treatment  Patient Details  Name: Alicia Davies MRN: [id number] Date of Birth: 1955/11/26 Referring Provider: Tandy Gaw PA-C    Encounter Date: 11/19/2016  PT End of Session - 11/19/16 1028    Visit Number  7    Number of Visits  12    Date for PT Re-Evaluation  12/04/16    PT Start Time  1017    PT Stop Time  1115    PT Time Calculation (min)  58 min    Activity Tolerance  Patient tolerated treatment well       Past Medical History:  Diagnosis Date  . Hyperparathyroidism   . Kidney stones     Past Surgical History:  Procedure Laterality Date  . ABDOMINAL HYSTERECTOMY    . APPENDECTOMY    . CHOLECYSTECTOMY    . FOOT SURGERY    . KNEE ARTHROSCOPY     Right and lft.   [name] PARATHYROIDECTOMY  04/2010   done by Dr Gerrit Friends  . SHOULDER SURGERY      There were no vitals filed for this visit.  Subjective Assessment - 11/19/16 1028    Subjective  Patient reports that she is trying to do a little more around the house but has not started a walking program yet - she has done some walking in the house. Sometimes she feels she is making progress and sometimes she can't tell any difference. She still has problems turning over at night. Pain persists.     Pertinent History  bilat TKA 4-6 years ago no problems with surgery or rehab; torn rotator cuff in the Lt shoulder in the past; HTN; obesity    How long can you sit comfortably?  10-15 min     How long can you stand comfortably?  10-15 min     How long can you walk comfortably?  10-15 min     Diagnostic tests  xrays multilevel DDD of lumbar spine     Patient Stated Goals  to be able to do activities she wants to do to enjoy life     Currently in Pain?  Yes    Pain Score  3     Pain Location  Back    Pain Orientation  Left;Lower    Pain Onset  More than a month  ago         [facility] PT Assessment - 11/19/16 0001      Assessment   Medical Diagnosis  LBP; Ly piriformis symdrome    Referring Provider  Tandy Gaw PA-C     Onset Date/Surgical Date  07/14/16 wosening in the past 3 months - pain for years    wosening in the past 3 months - pain for years    Hand Dominance  Right    Next MD Visit  to schedule     Prior Therapy  yes - PT/chiropractic care      AROM   Overall AROM Comments  bilat hip moblity limited by obesity     AROM Assessment Site  -- discomfort with all trunk motions    discomfort with all trunk motions    Lumbar Flexion  45%    Lumbar Extension  25%    Lumbar - Right Side Bend  45%    Lumbar - Left Side Bend  50%    Lumbar - Right Rotation  25%    Lumbar - Left Rotation  20%      Flexibility   Hamstrings  tight Rt ~ 75 deg; Lt 65 deg      Palpation   Spinal mobility  pain with spring testing lumbar spine     Palpation comment  muscular tightness noted through the Lt thoracolumbar musculature; QL; lats; piriformis; gluts                   [facility] Adult PT Treatment/Exercise - 11/19/16 0001      Lumbar Exercises: Aerobic   Stationary Bike  Nustep L5 x 6 min       Lumbar Exercises: Supine   Clam Limitations  hip abduction against blue TB x 20 reps in hooklying     Bent Knee Raise  15 reps core engaged   core engaged   Other Supine Lumbar Exercises  dead bug x 5 each LE       Knee/Hip Exercises: Standing   Hip Abduction  AROM;Right;Left;2 sets;10 reps    Hip Extension  AROM;Right;Left;2 sets;10 reps    Functional Squat  20 reps very shallow knee bend    very shallow knee bend      Modalities   Modalities  Electrical Stimulation;Moist Heat      Moist Heat Therapy   Number Minutes Moist Heat  20 Minutes    Moist Heat Location  Lumbar Spine      Electrical Stimulation   Electrical Stimulation Location  Lt lumbar to Lt piriformis     Electrical Stimulation Action  IFC    Electrical Stimulation  Parameters  to tolerance    Electrical Stimulation Goals  Tone;Pain      Manual Therapy   Manual Therapy  Soft tissue mobilization    Manual therapy comments  pt prone     Soft tissue mobilization  deep tissue work through the Lt lumbar and QL areas into Lt posterior hip/pirifirmis/hip abductors        Trigger Point Dry Needling - 11/19/16 1056    Consent Given?  Yes    Muscles Treated Lower Body  -- Lt with estim    Lt with estim    Gluteus Maximus Response  Palpable increased muscle length    Gluteus Minimus Response  Palpable increased muscle length    Piriformis Response  Palpable increased muscle length           PT Education - 11/19/16 1100    Education provided  Yes    Education Details  HEP walking program    Person(s) Educated  Patient    Methods  Explanation;Demonstration;Tactile cues;Verbal cues;Handout    Comprehension  Verbalized understanding;Returned demonstration;Verbal cues required;Tactile cues required          PT Long Term Goals - 11/19/16 1036      PT LONG TERM GOAL #1   Title  I with advanced HEP 12/04/16    Time  6    Period  Weeks    Status  On-going      PT LONG TERM GOAL #2   Title  perform bed mobility without pain  12/04/16    Time  6    Period  Weeks    Status  On-going      PT LONG TERM GOAL #3   Title  report pain decrease by 50-75% with ADLs 12/04/16    Time  6    Period  Weeks    Status  On-going  PT LONG TERM GOAL #4   Title  improve FOTO =/< 47% limited 12/04/16    Time  6    Period  Weeks    Status  On-going            Plan - 11/19/16 1032    Clinical Impression Statement  Persistent pain with functional activities. Patient has difficulty turning over in bed at night. Suggested lumbar support for sleep. No significant change noted in lumbar mobility. Continued tightness in lumbar and hip musculature.     Rehab Potential  Good    Clinical Impairments Affecting Rehab Potential  sedentary lifestyle; obesity;  chronic nature of symptoms     PT Frequency  2x / week    PT Duration  6 weeks    PT Treatment/Interventions  Patient/family education;ADLs/Self Care Home Management;Cryotherapy;Electrical Stimulation;Iontophoresis 4mg /ml Dexamethasone;Moist Heat;Ultrasound;Dry needling;Manual techniques;Therapeutic activities;Therapeutic exercise;Neuromuscular re-education    PT Next Visit Plan  continue DN and manual work as indicated, continue core stabilization     Consulted and Agree with Plan of Care  Patient       Patient will benefit from skilled therapeutic intervention in order to improve the following deficits and impairments:  Pain, Improper body mechanics, Postural dysfunction, Increased muscle spasms, Increased fascial restricitons, Decreased mobility, Decreased activity tolerance, Abnormal gait  Visit Diagnosis: Other symptoms and signs involving the musculoskeletal system  Chronic left-sided low back pain, with sciatica presence unspecified     Problem List Patient Active Problem List   Diagnosis Date Noted  . DDD (degenerative disc disease), lumbar 09/05/2016  . Viral upper respiratory infection 01/26/2016  . CKD (chronic kidney disease) stage 3, GFR 30-59 ml/min (HCC) 10/15/2015  . Seborrheic keratoses 07/14/2015  . Piriformis syndrome of left side 07/14/2015  . Morbid obesity (HCC) 07/14/2015  . Hyperglycemia 06/10/2014  . Muscle spasm 03/22/2014  . S/P right knee arthroscopy 02/28/2014  . OSA (obstructive sleep apnea) 09/29/2013  . Depression 07/21/2013  . Essential hypertension, benign 07/21/2013  . Severe obesity (BMI >= 40) (HCC) 07/21/2013  . Hyperlipidemia 07/21/2013  . Swelling of lower extremity 07/21/2013  . Benign cyst of left breast 10/28/2012  . Severe obstructive sleep apnea 10/12/2012  . Right knee pain 08/24/2010  . Primary hyperparathyroidism (HCC) 01/29/2010  . POSTMENOPAUSAL STATUS 01/17/2010  . B12 DEFICIENCY 12/11/2009  . HYPERCALCEMIA 12/06/2009  .  UNSPECIFIED VITAMIN D DEFICIENCY 12/05/2009  . OBESITY, UNSPECIFIED 12/05/2009  . MENOPAUSAL SYNDROME 12/05/2009  . FATIGUE 12/05/2009    Karinna Beadles P Evanee Lubrano Pt, MPH  11/19/2016, 11:11 AM  [facility] [address] [place], [place], [address] Phone: [phone]   Fax:  [phone]  Name: Alicia Davies MRN: [id number] Date of Birth: May 22, 1955

## 2016-11-21 ENCOUNTER — Other Ambulatory Visit: Payer: Self-pay

## 2016-11-21 ENCOUNTER — Ambulatory Visit (INDEPENDENT_AMBULATORY_CARE_PROVIDER_SITE_OTHER): Payer: BLUE CROSS/BLUE SHIELD | Admitting: Rehabilitative and Restorative Service Providers"

## 2016-11-21 ENCOUNTER — Encounter: Payer: Self-pay | Admitting: Rehabilitative and Restorative Service Providers"

## 2016-11-21 DIAGNOSIS — M545 Low back pain: Secondary | ICD-10-CM

## 2016-11-21 DIAGNOSIS — G8929 Other chronic pain: Secondary | ICD-10-CM

## 2016-11-21 DIAGNOSIS — R29898 Other symptoms and signs involving the musculoskeletal system: Secondary | ICD-10-CM | POA: Diagnosis not present

## 2016-11-21 NOTE — Patient Instructions (Addendum)
Strengthening: Chest Pull - Resisted    With resistive band looped around each hand, and arms straight out in front, stretch band across chest. Repeat __10__ times per set. Do _2-3___ sets per session. Do _1___ sessions per day.    ROM: Flexion - Wand (Supine)    Lie on back holding wand. Pull theraband tight - then lift arms up overhead  Repeat __10__ times per set. Do __2-3__ sets per session. Do __1__ sessions per day.

## 2016-11-21 NOTE — Therapy (Signed)
[facility] [address] [place], [place], [address] Phone: [phone]   Fax:  [phone]  Physical Therapy Treatment  Patient Details  Name: Alicia BossDebra Maack MRN: [id number] Date of Birth: [birth year] Referring Provider: Tandy GawJade Breeback PA-C    Encounter Date: 11/21/2016  PT End of Session - 11/21/16 1025    Visit Number  8    Number of Visits  12    Date for PT Re-Evaluation  12/04/16    PT Start Time  1018    PT Stop Time  1108    PT Time Calculation (min)  50 min    Activity Tolerance  Patient tolerated treatment well       Past Medical History:  Diagnosis Date  . Hyperparathyroidism   . Kidney stones     Past Surgical History:  Procedure Laterality Date  . ABDOMINAL HYSTERECTOMY    . APPENDECTOMY    . CHOLECYSTECTOMY    . FOOT SURGERY    . KNEE ARTHROSCOPY     Right and lft.   [name]. PARATHYROIDECTOMY  04/2010   done by Dr Gerrit FriendsGerkin  . SHOULDER SURGERY      There were no vitals filed for this visit.  Subjective Assessment - 11/21/16 1026    Subjective  Did well after therapy until the night when she had severe spasms in the Lt hip. She had a good day yesterday. She was able to get out and walk some yesterday. Did pretty well yesterday. Took OTC antiinflammatory meds yesterday which help. Discussed that she needs to see MD to discuss medication.     Currently in Pain?  Yes    Pain Score  0-No pain                      [facility] Adult PT Treatment/Exercise - 11/21/16 0001      Lumbar Exercises: Aerobic   Stationary Bike  Nustep L5 x 7 min       Lumbar Exercises: Standing   Wall Slides  15 reps with ball at back    with ball at back    Row  Strengthening;Both;20 reps    Theraband Level (Row)  Level 3 (Green)    Shoulder Extension  Strengthening;Both;20 reps;Theraband    Theraband Level (Shoulder Extension)  Level 3 (Green)    Other Standing Lumbar Exercises  step back with row core engaged 20 reps  each side green TB       Lumbar Exercises: Supine   Clam Limitations  hip abduction against blue TB x 20 reps in hooklying     Dead Bug  15 reps core engaged   core engaged   Other Supine Lumbar Exercises  TB horizontal abduction green TB x 10 reps 2 sets; shoulder flexion maintaining tension to bring arms overhead into shd flexion 10 reps 2 sets       Knee/Hip Exercises: Standing   Hip Abduction  AROM;Right;Left;2 sets;10 reps    Hip Extension  AROM;Right;Left;2 sets;10 reps    Functional Squat  20 reps very shallow knee bend    very shallow knee bend      Moist Heat Therapy   Number Minutes Moist Heat  20 Minutes    Moist Heat Location  Lumbar Spine      Electrical Stimulation   Electrical Stimulation Location  Lt lumbar to Lt piriformis     Electrical Stimulation Action  IFC    Electrical Stimulation Parameters  to tolerance  Electrical Stimulation Goals  Tone;Pain             PT Education - 11/21/16 1056    Education provided  Yes    Education Details  HEP     Person(s) Educated  Patient    Methods  Explanation;Demonstration;Tactile cues;Verbal cues;Handout    Comprehension  Verbalized understanding;Returned demonstration;Verbal cues required;Tactile cues required          PT Long Term Goals - 11/19/16 1036      PT LONG TERM GOAL #1   Title  I with advanced HEP 12/04/16    Time  6    Period  Weeks    Status  On-going      PT LONG TERM GOAL #2   Title  perform bed mobility without pain  12/04/16    Time  6    Period  Weeks    Status  On-going      PT LONG TERM GOAL #3   Title  report pain decrease by 50-75% with ADLs 12/04/16    Time  6    Period  Weeks    Status  On-going      PT LONG TERM GOAL #4   Title  improve FOTO =/< 47% limited 12/04/16    Time  6    Period  Weeks    Status  On-going            Plan - 11/21/16 1029    Clinical Impression Statement  Continued pain and limitations. Patient will contact MD to schedule appt to  discuss management of medications. Will order a lumbar support to try when sleeping. Continues to have limited tolerance for exercises.     Rehab Potential  Good    PT Frequency  2x / week    PT Duration  6 weeks    PT Treatment/Interventions  Patient/family education;ADLs/Self Care Home Management;Cryotherapy;Electrical Stimulation;Iontophoresis 4mg /ml Dexamethasone;Moist Heat;Ultrasound;Dry needling;Manual techniques;Therapeutic activities;Therapeutic exercise;Neuromuscular re-education    PT Next Visit Plan  continue DN and manual work as indicated, continue core stabilization     Consulted and Agree with Plan of Care  Patient       Patient will benefit from skilled therapeutic intervention in order to improve the following deficits and impairments:  Pain, Improper body mechanics, Postural dysfunction, Increased muscle spasms, Increased fascial restricitons, Decreased mobility, Decreased activity tolerance, Abnormal gait  Visit Diagnosis: Other symptoms and signs involving the musculoskeletal system  Chronic left-sided low back pain, with sciatica presence unspecified     Problem List Patient Active Problem List   Diagnosis Date Noted  . DDD (degenerative disc disease), lumbar 09/05/2016  . Viral upper respiratory infection 01/26/2016  . CKD (chronic kidney disease) stage 3, GFR 30-59 ml/min (HCC) 10/15/2015  . Seborrheic keratoses 07/14/2015  . Piriformis syndrome of left side 07/14/2015  . Morbid obesity (HCC) 07/14/2015  . Hyperglycemia 06/10/2014  . Muscle spasm 03/22/2014  . S/P right knee arthroscopy 02/28/2014  . OSA (obstructive sleep apnea) 09/29/2013  . Depression 07/21/2013  . Essential hypertension, benign 07/21/2013  . Severe obesity (BMI >= 40) (HCC) 07/21/2013  . Hyperlipidemia 07/21/2013  . Swelling of lower extremity 07/21/2013  . Benign cyst of left breast 10/28/2012  . Severe obstructive sleep apnea 10/12/2012  . Right knee pain 08/24/2010  . Primary  hyperparathyroidism (HCC) 01/29/2010  . POSTMENOPAUSAL STATUS 01/17/2010  . B12 DEFICIENCY 12/11/2009  . HYPERCALCEMIA 12/06/2009  . UNSPECIFIED VITAMIN D DEFICIENCY 12/05/2009  . OBESITY, UNSPECIFIED 12/05/2009  . MENOPAUSAL  SYNDROME 12/05/2009  . FATIGUE 12/05/2009    Mykenzi Vanzile Rober MinionP Dorell Gatlin PT, MPH  11/21/2016, 11:02 AM  [facility] [address] [place], [place], [address] Phone: [phone]   Fax:  [phone]  Name: Alicia BossDebra Lefebre MRN: [id number] Date of Birth: 27-Jun-1955

## 2016-11-27 ENCOUNTER — Encounter: Payer: BLUE CROSS/BLUE SHIELD | Admitting: Physical Therapy

## 2016-11-29 ENCOUNTER — Encounter: Payer: Self-pay | Admitting: Rehabilitative and Restorative Service Providers"

## 2016-11-29 ENCOUNTER — Other Ambulatory Visit: Payer: Self-pay | Admitting: Physician Assistant

## 2016-11-29 ENCOUNTER — Ambulatory Visit: Payer: BLUE CROSS/BLUE SHIELD | Admitting: Rehabilitative and Restorative Service Providers"

## 2016-11-29 ENCOUNTER — Other Ambulatory Visit: Payer: Self-pay

## 2016-11-29 ENCOUNTER — Encounter: Payer: Self-pay | Admitting: Physician Assistant

## 2016-11-29 ENCOUNTER — Ambulatory Visit: Payer: BLUE CROSS/BLUE SHIELD | Admitting: Physician Assistant

## 2016-11-29 VITALS — BP 140/69 | HR 59 | Ht 69.02 in | Wt 319.0 lb

## 2016-11-29 DIAGNOSIS — Z23 Encounter for immunization: Secondary | ICD-10-CM | POA: Diagnosis not present

## 2016-11-29 DIAGNOSIS — Z79899 Other long term (current) drug therapy: Secondary | ICD-10-CM | POA: Diagnosis not present

## 2016-11-29 DIAGNOSIS — R29898 Other symptoms and signs involving the musculoskeletal system: Secondary | ICD-10-CM

## 2016-11-29 DIAGNOSIS — M545 Low back pain: Secondary | ICD-10-CM

## 2016-11-29 DIAGNOSIS — G8929 Other chronic pain: Secondary | ICD-10-CM

## 2016-11-29 DIAGNOSIS — M62838 Other muscle spasm: Secondary | ICD-10-CM | POA: Diagnosis not present

## 2016-11-29 DIAGNOSIS — M5136 Other intervertebral disc degeneration, lumbar region: Secondary | ICD-10-CM | POA: Diagnosis not present

## 2016-11-29 MED ORDER — PHENTERMINE HCL 37.5 MG PO TABS: 37.5000 mg | ORAL_TABLET | Freq: Every day | ORAL | 0 refills | Status: DC

## 2016-11-29 MED ORDER — TRAMADOL HCL 50 MG PO TABS: 50.0000 mg | ORAL_TABLET | Freq: Three times a day (TID) | ORAL | 0 refills | Status: DC | PRN

## 2016-11-29 NOTE — Progress Notes (Signed)
Subjective:    Patient ID: Alicia Davies, female    DOB: [birth year], 61 y.o.   MRN: [id number]  HPI  Pt is a 61 yo morbidly obese female who presents to the clinic to follow up on muscle spasms of back. She is still having a lot of pain and spasms. She has been going to PT with minimal pain relief and seems to go right back to spasms a few days. She has tried flexeril, norflex, and soma. They do not seem to help and just make her sleepy. Dry needling has helped the most but not for long. She denies any numbness or tingling. She does have some thigh pain mostly at night. Her ROM is limited due to pain. Pt has been taking OTC NSAIDs but has hx of elevated serum creatine. Tramadol does help some but she has not been taking daily.   Patient is trying to lose weight but pain is keeping her from being moblie. saxenda and belviq were too expensive. She did lose some weight on phentermine and would like to try it again.     .. Active Ambulatory Problems    Diagnosis Date Noted  . B12 DEFICIENCY 12/11/2009  . UNSPECIFIED VITAMIN D DEFICIENCY 12/05/2009  . HYPERCALCEMIA 12/06/2009  . OBESITY, UNSPECIFIED 12/05/2009  . MENOPAUSAL SYNDROME 12/05/2009  . FATIGUE 12/05/2009  . Primary hyperparathyroidism (HCC) 01/29/2010  . POSTMENOPAUSAL STATUS 01/17/2010  . Right knee pain 08/24/2010  . Severe obstructive sleep apnea 10/12/2012  . Benign cyst of left breast 10/28/2012  . Depression 07/21/2013  . Essential hypertension, benign 07/21/2013  . Severe obesity (BMI >= 40) (HCC) 07/21/2013  . Hyperlipidemia 07/21/2013  . Swelling of lower extremity 07/21/2013  . OSA (obstructive sleep apnea) 09/29/2013  . S/P right knee arthroscopy 02/28/2014  . Muscle spasm 03/22/2014  . Hyperglycemia 06/10/2014  . Seborrheic keratoses 07/14/2015  . Piriformis syndrome of left side 07/14/2015  . Morbid obesity (HCC) 07/14/2015  . CKD (chronic kidney disease) stage 3, GFR 30-59 ml/min (HCC) 10/15/2015  . Viral  upper respiratory infection 01/26/2016  . DDD (degenerative disc disease), lumbar 09/05/2016   Resolved Ambulatory Problems    Diagnosis Date Noted  . No Resolved Ambulatory Problems   Past Medical History:  Diagnosis Date  . Hyperparathyroidism   . Kidney stones         Review of Systems  All other systems reviewed and are negative.      Objective:   Physical Exam  Constitutional: She is oriented to person, place, and time. She appears well-developed and well-nourished.  Morbid obesity.   HENT:  Head: Normocephalic and atraumatic.  Cardiovascular: Normal rate, regular rhythm and normal heart sounds.  Pulmonary/Chest: Effort normal and breath sounds normal.  Neurological: She is alert and oriented to person, place, and time.  Psychiatric: She has a normal mood and affect. Her behavior is normal.          Assessment & Plan:  [name].[name].Diagnoses and all orders for this visit:  Muscle spasm -     traMADol (ULTRAM) 50 MG tablet; Take 1 tablet (50 mg total) every 8 (eight) hours as needed by mouth.  Medication management -     COMPLETE METABOLIC PANEL WITH GFR  DDD (degenerative disc disease), lumbar -     traMADol (ULTRAM) 50 MG tablet; Take 1 tablet (50 mg total) every 8 (eight) hours as needed by mouth. -     Drugs of abuse screen w/o alc (for BH OP)  Influenza  vaccine needed -     Flu Vaccine QUAD 6+ mos PF IM (Fluarix Quad PF)  Morbid obesity (HCC) -     phentermine (ADIPEX-P) 37.5 MG tablet; Take 1 tablet (37.5 mg total) daily before breakfast by mouth.   [place] controlled substance database reviewed with no concerns.  UDS ordered.  Pain contract signed.  Tramadol refilled.  Failed PT. Failed muscle relaxer. Follow up with sports medicine.   Concerned about serum creatine with NSAID use will check cmp with GFR.   [name]..Discussed low carb diet with 1500 calories and 80g of protein.  Exercising at least 150 minutes a week.  My Fitness Pal could be a [employer] resource.   Will try phentermine again. Follow up in 1 month.   .Spent 30 minutes with patient and greater than 50 percent of visit spent counseling patient regarding treatment plan.

## 2016-11-29 NOTE — Therapy (Addendum)
[facility] [address] [place], [place], [address] Phone: [phone]   Fax:  [phone]  Physical Therapy Treatment  Patient Details  Name: Alicia Davies MRN: [id number] Date of Birth: [birth year] Referring Provider: Iran Planas PA-C   Encounter Date: 11/29/2016  PT End of Session - 11/29/16 1023    Visit Number  9    Number of Visits  12    Date for PT Re-Evaluation  12/04/16    PT Start Time  1020    PT Stop Time  1116    PT Time Calculation (min)  56 min    Activity Tolerance  Patient tolerated treatment well       Past Medical History:  Diagnosis Date  . Hyperparathyroidism   . Kidney stones     Past Surgical History:  Procedure Laterality Date  . ABDOMINAL HYSTERECTOMY    . APPENDECTOMY    . CHOLECYSTECTOMY    . FOOT SURGERY    . KNEE ARTHROSCOPY     Right and lft.   [name] PARATHYROIDECTOMY  04/2010   done by Dr Harlow Asa  . SHOULDER SURGERY      There were no vitals filed for this visit.  Subjective Assessment - 11/29/16 1024    Subjective  Has been sick since the weekend with respiratory infection. Back and hip have continued to hurt - severe at times. Lt hip pain and spasms prevent her from doing exercises or activities around the house. Has not checked with the MD about medication options yet - has felt too bad. Will chack with the doctor today.     Currently in Pain?  Yes    Pain Score  3     Pain Location  Back    Pain Orientation  Left;Lower    Pain Descriptors / Indicators  Aching;Nagging    Pain Type  Chronic pain    Pain Radiating Towards  upper part of posterior hip - burning into upper thighs when she lies down     Pain Onset  More than a month ago    Pain Frequency  Constant    Aggravating Factors   bending forward; turning over in bed; later in the evening     Pain Relieving Factors  TENS unit          [facility] PT Assessment - 11/29/16 0001      Assessment   Medical Diagnosis  LBP;  Ly piriformis symdrome    Referring Provider  Iran Planas PA-C    Onset Date/Surgical Date  07/14/16 wosening in the past 3 months - pain for years     Hand Dominance  Right    Next MD Visit  to schedule     Prior Therapy  yes - PT/chiropractic care      AROM   Overall AROM Comments  bilat hip moblity limited by obesity     AROM Assessment Site  -- discomfort with all hip motions     Lumbar Flexion  45%    Lumbar Extension  25%    Lumbar - Right Side Bend  40%    Lumbar - Left Side Bend  40%    Lumbar - Right Rotation  25%    Lumbar - Left Rotation  20%      Strength   Overall Strength Comments  5/5 bilat LE's       Flexibility   Hamstrings  tight Rt 75 deg; Lt 63 deg    Quadriceps  tight Lt     ITB  tight Lt > Rt     Piriformis  tight Lt       Palpation   Spinal mobility  pain with spring testing lumbar spine     Palpation comment  muscular tightness noted through the Lt thoracolumbar musculature; QL; lats; piriformis; gluts       Transfers   Comments  moves with difficulty sit to stand and stand to sit; any rolling or turning       Ambulation/Gait   Gait Comments  antalgic gait pattern                   [facility] Adult PT Treatment/Exercise - 11/29/16 0001      Lumbar Exercises: Stretches   Passive Hamstring Stretch  2 reps;30 seconds    Lower Trunk Rotation  20 seconds;2 reps    Piriformis Stretch  3 reps;30 seconds modified       Lumbar Exercises: Aerobic   Stationary Bike  Nustep L5 x 7 min       Lumbar Exercises: Standing   Wall Slides  15 reps with ball at back       Lumbar Exercises: Supine   Clam Limitations  hip abduction against blue TB x 20 reps in hooklying     Bent Knee Raise  15 reps core engaged     Dead Bug  15 reps core engaged      Knee/Hip Exercises: Standing   Functional Squat  20 reps very shallow knee bend       Moist Heat Therapy   Number Minutes Moist Heat  20 Minutes    Moist Heat Location  Lumbar Spine      Electrical  Stimulation   Electrical Stimulation Location  Lt lumbar to Lt piriformis     Electrical Stimulation Action  IFC    Electrical Stimulation Parameters  to tolerance    Electrical Stimulation Goals  Tone;Pain      Manual Therapy   Manual Therapy  Soft tissue mobilization    Manual therapy comments  pt prone     Soft tissue mobilization  deep tissue work through the Lt lumbar and QL areas into Lt posterior hip/pirifirmis/hip abductors        Trigger Point Dry Needling - 11/29/16 1053    Consent Given?  Yes    Muscles Treated Lower Body  -- Lt with estim    Gluteus Maximus Response  Palpable increased muscle length    Gluteus Minimus Response  Palpable increased muscle length    Piriformis Response  Palpable increased muscle length           PT Education - 11/29/16 1102    Education provided  Yes    Education Details  patient to contact PA's office to schedule appt     Person(s) Educated  Patient    Methods  Explanation    Comprehension  Verbalized understanding          PT Long Term Goals - 11/29/16 1023      PT LONG TERM GOAL #1   Title  I with advanced HEP 12/04/16    Time  6    Period  Weeks    Status  On-going      PT LONG TERM GOAL #2   Title  perform bed mobility without pain  12/04/16    Time  6    Period  Weeks    Status  On-going  PT LONG TERM GOAL #3   Title  report pain decrease by 50-75% with ADLs 12/04/16    Time  6    Period  Weeks    Status  On-going      PT LONG TERM GOAL #4   Title  improve FOTO =/< 47% limited 12/04/16    Time  6    Period  Weeks    Status  On-going            Plan - 11/29/16 1037    Clinical Impression Statement  No significant change in subjective pain level or objective findings. Alicia Davies has difficulty tolerating exercise for core stabilization, stretching, strengthening, functional activities. She has temporary improvement in pain with modalities, DN, manual work but no lasting change in pain. Functional  activity level has increased slightly at home per patient report. Suggested [name] return for re-check to discuss options for medication and/or further diagnostic testing. No goals of therapy accomlished.     Rehab Potential  Good    Clinical Impairments Affecting Rehab Potential  sedentary lifestyle; obesity; chronic nature of symptoms     PT Frequency  2x / week    PT Duration  6 weeks    PT Treatment/Interventions  Patient/family education;ADLs/Self Care Home Management;Cryotherapy;Electrical Stimulation;Iontophoresis 91m/ml Dexamethasone;Moist Heat;Ultrasound;Dry needling;Manual techniques;Therapeutic activities;Therapeutic exercise;Neuromuscular re-education    PT Next Visit Plan  patient to schedule return appt with JLuvenia Starch- we will hold chart open two weeks     Consulted and Agree with Plan of Care  Patient       Patient will benefit from skilled therapeutic intervention in order to improve the following deficits and impairments:  Pain, Improper body mechanics, Postural dysfunction, Increased muscle spasms, Increased fascial restricitons, Decreased mobility, Decreased activity tolerance, Abnormal gait  Visit Diagnosis: Other symptoms and signs involving the musculoskeletal system  Chronic left-sided low back pain, with sciatica presence unspecified     Problem List Patient Active Problem List   Diagnosis Date Noted  . DDD (degenerative disc disease), lumbar 09/05/2016  . Viral upper respiratory infection 01/26/2016  . CKD (chronic kidney disease) stage 3, GFR 30-59 ml/min (HCC) 10/15/2015  . Seborrheic keratoses 07/14/2015  . Piriformis syndrome of left side 07/14/2015  . Morbid obesity ([facility] 07/14/2015  . Hyperglycemia 06/10/2014  . Muscle spasm 03/22/2014  . S/P right knee arthroscopy 02/28/2014  . OSA (obstructive sleep apnea) 09/29/2013  . Depression 07/21/2013  . Essential hypertension, benign 07/21/2013  . Severe obesity (BMI >= 40) ([facility] 07/21/2013  . Hyperlipidemia  07/21/2013  . Swelling of lower extremity 07/21/2013  . Benign cyst of left breast 10/28/2012  . Severe obstructive sleep apnea 10/12/2012  . Right knee pain 08/24/2010  . Primary hyperparathyroidism ([place] 01/29/2010  . POSTMENOPAUSAL STATUS 01/17/2010  . B12 DEFICIENCY 12/11/2009  . HYPERCALCEMIA 12/06/2009  . UNSPECIFIED VITAMIN D DEFICIENCY 12/05/2009  . OBESITY, UNSPECIFIED 12/05/2009  . MENOPAUSAL SYNDROME 12/05/2009  . FATIGUE 12/05/2009    Alicia Davies PNilda SimmerPT, MPH  11/29/2016, 11:03 AM  [place] [address] [address]Phone: [phone]  Fax:  [phone] Name: Alicia FlahartyMRN: [id number] of Birth: [birth year] PHYSICAL THERAPY DISCHARGE SUMMARY  Visits from Start of Care: 9  Current functional level related to goals / functional outcomes: See last progress note for discharge status   Remaining deficits: No significant change in symptoms with PT   Education / Equipment: HEP  Plan: Patient agrees to discharge.  Patient goals were not met. Patient is being discharged due to                                                     ?????   Patient referred to MD for re-assessment    Alicia Davies P. Helene Kelp PT, MPH 12/17/16 10:37 AM

## 2016-11-30 LAB — COMPLETE METABOLIC PANEL WITH GFR
AG Ratio: 1.4 (calc) (ref 1.0–2.5)
ALBUMIN MSPROF: 4 g/dL (ref 3.6–5.1)
ALKALINE PHOSPHATASE (APISO): 73 U/L (ref 33–130)
ALT: 13 U/L (ref 6–29)
AST: 17 U/L (ref 10–35)
BUN / CREAT RATIO: 18 (calc) (ref 6–22)
BUN: 32 mg/dL — AB (ref 7–25)
CO2: 27 mmol/L (ref 20–32)
CREATININE: 1.82 mg/dL — AB (ref 0.50–1.05)
Calcium: 9.4 mg/dL (ref 8.6–10.4)
Chloride: 105 mmol/L (ref 98–110)
GFR, EST AFRICAN AMERICAN: 35 mL/min/{1.73_m2} — AB (ref >=60)
GFR, Est Non African American: 30 mL/min/{1.73_m2} — ABNORMAL LOW (ref >=60)
GLUCOSE: 88 mg/dL (ref 65–99)
Globulin: 2.8 g/dL (calc) (ref 1.9–3.7)
Potassium: 4.5 mmol/L (ref 3.5–5.3)
Sodium: 139 mmol/L (ref 135–146)
TOTAL PROTEIN: 6.8 g/dL (ref 6.1–8.1)
Total Bilirubin: 0.5 mg/dL (ref 0.2–1.2)

## 2016-11-30 LAB — DRUGS OF ABUSE SCREEN W/O ALC, ROUTINE URINE
AMPHETAMINES (1000 NG/ML SCRN): NEGATIVE
BARBITURATES: NEGATIVE
BENZODIAZEPINES: NEGATIVE
COCAINE METABOLITES: NEGATIVE
MARIJUANA MET (50 NG/ML SCRN): NEGATIVE
METHADONE: NEGATIVE
METHAQUALONE: NEGATIVE
OPIATES: NEGATIVE
PHENCYCLIDINE: NEGATIVE
PROPOXYPHENE: NEGATIVE

## 2016-12-01 ENCOUNTER — Encounter: Payer: Self-pay | Admitting: Physician Assistant

## 2016-12-01 NOTE — Telephone Encounter (Signed)
Call pt: serum creatine went up more you are going to have to stop all NSAIDs no ibpurofen, advil, motrin, aleve etc. Only use tramadol/tylenol for pain. Make sure staying hydrated and will recheck in 2-3 weeks kidney function to see where it is trending.

## 2016-12-02 ENCOUNTER — Ambulatory Visit: Payer: BLUE CROSS/BLUE SHIELD | Admitting: Family Medicine

## 2016-12-02 ENCOUNTER — Encounter: Payer: Self-pay | Admitting: Family Medicine

## 2016-12-02 VITALS — BP 121/57 | HR 68 | Temp 98.2°F | Resp 16 | Wt 320.4 lb

## 2016-12-02 DIAGNOSIS — F3342 Major depressive disorder, recurrent, in full remission: Secondary | ICD-10-CM

## 2016-12-02 DIAGNOSIS — G894 Chronic pain syndrome: Secondary | ICD-10-CM

## 2016-12-02 DIAGNOSIS — M5136 Other intervertebral disc degeneration, lumbar region: Secondary | ICD-10-CM | POA: Diagnosis not present

## 2016-12-02 DIAGNOSIS — G8929 Other chronic pain: Secondary | ICD-10-CM

## 2016-12-02 HISTORY — DX: Other chronic pain: G89.29

## 2016-12-02 MED ORDER — LORCASERIN HCL ER 20 MG PO TB24: 20.0000 mg | ORAL_TABLET | Freq: Every day | ORAL | 3 refills | Status: DC

## 2016-12-02 MED ORDER — LORAZEPAM 0.5 MG PO TABS: ORAL_TABLET | ORAL | 0 refills | Status: DC

## 2016-12-02 NOTE — Patient Instructions (Signed)
Thank you for coming in today. We will do a MRI for lumbar pain.  Recheck after MRI.  Use ativan prior to MRI for anxiety.  Do not drive yourself.   Start Belviq for weight loss.  Use the calorie counting application with measuring your food.  Bring your phone to the next visit to go over food log.   Lorcaserin oral tablets What is this medicine? LORCASERIN (lor ca SER in) is used to promote and maintain weight loss in obese patients. This medicine should be used with a reduced calorie diet and, if appropriate, an exercise program. This medicine may be used for other purposes; ask your health care provider or pharmacist if you have questions. COMMON BRAND NAME(S): Belviq What should I tell my health care provider before I take this medicine? They need to know if you have any of these conditions: -anatomical deformation of the penis, Peyronie's disease, or history of priapism (painful and prolonged erection) -diabetes -heart disease -history of blood diseases, like sickle cell anemia or leukemia -history of irregular heartbeat -kidney disease -liver disease -suicidal thoughts, plans, or attempt; a previous suicide attempt by you or a family member -an unusual or allergic reaction to lorcaserin, other medicines, foods, dyes, or preservatives -pregnant or trying to get pregnant -breast-feeding How should I use this medicine? Take this medicine by mouth with a glass of water. Follow the directions on the prescription label. You can take it with or without food. Take your medicine at regular intervals. Do not take it more often than directed. Do not stop taking except on your doctor's advice. Talk to your pediatrician regarding the use of this medicine in children. Special care may be needed. Overdosage: If you think you have taken too much of this medicine contact a poison control center or emergency room at once. NOTE: This medicine is only for you. Do not share this medicine with  others. What if I miss a dose? If you miss a dose, take it as soon as you can. If it is almost time for your next dose, take only that dose. Do not take double or extra doses. What may interact with this medicine? -cabergoline -certain medicines for depression, anxiety, or psychotic disturbances -certain medicines for erectile dysfunction -certain medicines for migraine headache like almotriptan, eletriptan, frovatriptan, naratriptan, rizatriptan, sumatriptan, zolmitriptan -dextromethorphan -linezolid -lithium -medicines for diabetes -other weight loss products -tramadol -St. John's Wort -stimulant medicines for attention disorders, weight loss, or to stay awake -tryptophan This list may not describe all possible interactions. Give your health care provider a list of all the medicines, herbs, non-prescription drugs, or dietary supplements you use. Also tell them if you smoke, drink alcohol, or use illegal drugs. Some items may interact with your medicine. What should I watch for while using this medicine? This medicine is intended to be used in addition to a healthy diet and appropriate exercise. The best results are achieved this way. Your doctor should instruct you to stop taking this medicine if you do not lose a certain amount of weight within the first 12 weeks of treatment, but it is important that you do not change your dose in any way without consulting your doctor or health care professional. Visit your doctor or health care professional for regular checkups. Your doctor may order blood tests or other tests to see how you are doing. Do not drive, use machinery, or do anything that needs mental alertness until you know how this medicine affects you. This medicine may  affect blood sugar levels. If you have diabetes, check with your doctor or health care professional before you change your diet or the dose of your diabetic medicine. Patients and their families should watch out for  worsening depression or thoughts of suicide. Also watch out for sudden changes in feelings such as feeling anxious, agitated, panicky, irritable, hostile, aggressive, impulsive, severely restless, overly excited and hyperactive, or not being able to sleep. If this happens, especially at the beginning of treatment or after a change in dose, call your health care professional. Contact your doctor or health care professional right away if you are a man with an erection that lasts longer than 4 hours or if the erection becomes painful. This may be a sign of serious problem and must be treated right away to prevent permanent damage. What side effects may I notice from receiving this medicine? Side effects that you should report to your doctor or health care professional as soon as possible: -allergic reactions like skin rash, itching or hives, swelling of the face, lips, or tongue -abnormal production of milk -breast enlargement in both males and females -breathing problems -changes in emotions or moods -changes in vision -confusion -erection lasting more than 4 hours or a painful erection -fast or irregular heart beat -feeling faint or lightheaded, falls -fever or chills, sore throat -hallucination, loss of contact with reality -high or low blood pressure -menstrual changes -restlessness -slow or irregular heartbeat -stiff muscles -sweating -suicidal thoughts or other mood changes -swelling of the ankles, feet, hands -unusually weak or tired -vomiting Side effects that usually do not require medical attention (report to your doctor or health care professional if they continue or are bothersome): -back pain -constipation -cough -dry mouth -nausea -tiredness This list may not describe all possible side effects. Call your doctor for medical advice about side effects. You may report side effects to FDA at 1-800-FDA-1088. Where should I keep my medicine? Keep out of the reach of children.  This medicine can be abused. Keep your medicine in a safe place to protect it from theft. Do not share this medicine with anyone. Selling or giving away this medicine is dangerous and against the law. Store at room temperature between 15 and 30 degrees C (59 and 86 degrees F). Throw away any unused medicine after the expiration date. NOTE: This sheet is a summary. It may not cover all possible information. If you have questions about this medicine, talk to your doctor, pharmacist, or health care provider.  2018 Elsevier/Gold Standard (2015-02-02 12:13:31)

## 2016-12-02 NOTE — Progress Notes (Signed)
Alicia Davies is a 61 y.o. female who presents [name] today for chronic back pain and morbid obesity.   Chronic Back Pain: Alicia Davies has chronic back pain ongoing for years worsening over the last several months. She's been having worsening spasms and has recently completed a trial of physical therapy which was not helpful. The pain is severe and constant but worse with activity. She denies significant radiating pain weakness or numbness. She is an x-ray from 2016 which shows severe degenerative changes with scoliosis. Recent injury. She would like to avoid opiates for pain control possible.  Additionally patient has morbid obesity. She has struggled for years to lose weight. She is not currently following a restrictive diet. In the past she's used phentermine which has helped temporarily but she regained the weight when she stopped the phentermine. She's frustrated and recognizes obesity is a causal agent in her back pain. She notes that she cannot exercise due to her back pain.   Past Medical History:  Diagnosis Date  . DDD (degenerative disc disease), lumbar 09/05/2016  . Depression 07/21/2013  . Essential hypertension, benign 07/21/2013  . Hyperlipidemia 07/21/2013  . Hyperparathyroidism   . Kidney stones   . Morbid obesity (HCC) 07/14/2015  . OSA (obstructive sleep apnea) 09/29/2013   Currently on CPAP therapy.  4mmHG [facility].    Past Surgical History:  Procedure Laterality Date  . ABDOMINAL HYSTERECTOMY    . APPENDECTOMY    . CHOLECYSTECTOMY    . FOOT SURGERY    . KNEE ARTHROSCOPY     Right and lft.   [name]. PARATHYROIDECTOMY  04/2010   done by Dr Gerrit FriendsGerkin  . SHOULDER SURGERY     Social History   Tobacco Use  . Smoking status: Never Smoker  . Smokeless tobacco: Never Used  Substance Use Topics  . Alcohol use: No   family history includes Diabetes in her  brother and mother; Heart failure in her brother and mother.  ROS as above:  Medications: Current Outpatient Medications  Medication Sig Dispense Refill  . lisinopril-hydrochlorothiazide (PRINZIDE,ZESTORETIC) 20-25 MG tablet Take 1 tablet by mouth daily. 90 tablet 1  . pravastatin (PRAVACHOL) 40 MG tablet TAKE 1 TABLET BY MOUTH EVERY DAY 30 tablet 5  . sertraline (ZOLOFT) 50 MG tablet Take 1 tablet (50 mg total) by mouth daily. 90 tablet 1  . traMADol (ULTRAM) 50 MG tablet Take 1 tablet (50 mg total) every 8 (eight) hours as needed by mouth. 60 tablet 0  . LORazepam (ATIVAN) 0.5 MG tablet Take 1-2 tabs 30-60 min prior to MRI 4 tablet 0  . Lorcaserin HCl ER (BELVIQ XR) 20 MG TB24 Take 20 mg daily by mouth. 30 tablet 3   No current facility-administered medications for this visit.    Allergies  Allergen Reactions  . Ciprofloxacin   . Fluconazole   . Fluconazole In Dextrose   . Metronidazole   . Naproxen Sodium     HIVES   . Naproxen Sodium     Health Maintenance Health Maintenance  Topic Date Due  . Hepatitis C Screening  April 15, 1955  . HIV Screening  01/04/1973  . PAP SMEAR  01/05/1979  . MAMMOGRAM  12/13/2016  . TETANUS/TDAP  01/18/2020  . COLONOSCOPY  01/21/2020  . INFLUENZA VACCINE  Completed     Exam:  BP (!) 121/57   Pulse 68   Temp 98.2 F (36.8 C) (Oral)   Resp 16  Wt (!) 320 lb 6.4 oz (145.3 kg)   SpO2 97%   BMI 47.29 kg/m  Gen: Well NAD, morbid obesity HEENT: EOMI,  MMM Lungs: Normal work of breathing. CTABL Heart: RRR no MRG Abd: NABS, Soft. Nondistended, Nontender Exts: Brisk capillary refill, warm and well perfused.  MSK: Slumped posture with poor trunk control. L-spine nontender to midline. Diffusely tender left lower lumbar paraspinal muscle group. Back motion limited. Antalgic gait.  CLINICAL DATA:  Left flank and low back pain for the past 3 months, no known injury  EXAM: LUMBAR SPINE - COMPLETE 4+ VIEW  COMPARISON:  CT scan of the  abdomen and pelvis dated July 23, 2013  FINDINGS: There is stable curvature of the lumbar spine convex toward the left. The lumbar vertebral bodies are preserved in height. There is multilevel mild to moderate disc space narrowing. There is facet joint hypertrophy at L4-5 and L5-S1. There is no spondylolisthesis. There is mild levocurvature of the mid lumbar spine. The pedicles and transverse processes are intact.  IMPRESSION: There is multilevel degenerative disc disease of the lumbar spine with gentle levocurvature. There is no acute bony abnormality.     Assessment and Plan: 61 y.o. female with  Chronic back pain: Patient is failing conservative management and is effectively disabled by her back pain. We discussed options.  Plan for referral to LCSW for coming in behavioral therapy for management of suffering symptoms. Additionally we'll obtain an MRI to plan for treatment including potential facet injections or nerve ablations. Additionally discussed Cymbalta which we'll hold off on for now.  Morbid Obesity: Ongoing for years. Patient is failing other management options. Plan for food low calorie counting and fluid measurement. We'll also use Belviq and recheck in a few weeks.   Orders Placed This Encounter  Procedures  . MR Lumbar Spine Wo Contrast    Standing Status:   Future    Standing Expiration Date:   02/01/2018    Order Specific Question:   What is the patient's sedation requirement?    Answer:   Anti-anxiety    Order Specific Question:   Does the patient have a pacemaker or implanted devices?    Answer:   No    Order Specific Question:   Preferred imaging location?    Answer:   [facility] (table limit-350lbs)    Order Specific Question:   Radiology Contrast Protocol - do NOT remove file path    Answer:   [email or web address]   Meds ordered this encounter  Medications  . Lorcaserin HCl ER (BELVIQ XR) 20 MG TB24    Sig:  Take 20 mg daily by mouth.    Dispense:  30 tablet    Refill:  3  . LORazepam (ATIVAN) 0.5 MG tablet    Sig: Take 1-2 tabs 30-60 min prior to MRI    Dispense:  4 tablet    Refill:  0     Discussed warning signs or symptoms. Please see discharge instructions. Patient expresses understanding.  I spent 25 minutes with this patient, greater than 50% was face-to-face time counseling regarding ddx and treatment plan.

## 2016-12-10 ENCOUNTER — Telehealth: Payer: Self-pay | Admitting: *Deleted

## 2016-12-10 NOTE — Telephone Encounter (Signed)
Pre Authorization sent to cover my meds.[id number]

## 2016-12-12 NOTE — Telephone Encounter (Signed)
belviq has been approved through insurance. Patient notified and will let pharmacy know

## 2016-12-16 ENCOUNTER — Ambulatory Visit (INDEPENDENT_AMBULATORY_CARE_PROVIDER_SITE_OTHER): Payer: BLUE CROSS/BLUE SHIELD

## 2016-12-16 DIAGNOSIS — M5136 Other intervertebral disc degeneration, lumbar region: Secondary | ICD-10-CM | POA: Diagnosis not present

## 2016-12-16 DIAGNOSIS — M47816 Spondylosis without myelopathy or radiculopathy, lumbar region: Secondary | ICD-10-CM | POA: Diagnosis not present

## 2016-12-17 ENCOUNTER — Ambulatory Visit: Payer: BLUE CROSS/BLUE SHIELD | Admitting: Family Medicine

## 2016-12-17 ENCOUNTER — Encounter: Payer: Self-pay | Admitting: Family Medicine

## 2016-12-17 VITALS — BP 115/70 | HR 64 | Ht 69.0 in | Wt 319.0 lb

## 2016-12-17 DIAGNOSIS — M5136 Other intervertebral disc degeneration, lumbar region: Secondary | ICD-10-CM

## 2016-12-17 DIAGNOSIS — G894 Chronic pain syndrome: Secondary | ICD-10-CM

## 2016-12-17 NOTE — Patient Instructions (Signed)
Thank you for coming in today.  You should hear from [facility] about the injections soon.  Let me know if you dont hear anything by the end of the week or early next week.   Recheck with me a few weeks after the injections.    Even if it helps for a little while the injections are helpful at planning the ablation.

## 2016-12-18 NOTE — Progress Notes (Signed)
Alicia Davies is a 61 y.o. female who presents to [facility] today for chronic back pain.   Alicia Davies has chronic back pain and has failed to improve despite conservative management including physical therapy. In the interim she had an MRI of her lumbar sprain for facet injection planning. MRIs of the lumbar spine shows DJD and facet DJD. She notes chronic pain interfering with her ability to sit or stand for a prolonged period of time. Her goals are to be able to attend her daughter's wedding in April 2019 and stand for the wedding and perhaps dance little during the reception.   Past Medical History:  Diagnosis Date  . Chronic pain 12/02/2016  . DDD (degenerative disc disease), lumbar 09/05/2016  . Depression 07/21/2013  . Essential hypertension, benign 07/21/2013  . Hyperlipidemia 07/21/2013  . Hyperparathyroidism   . Kidney stones   . Morbid obesity (HCC) 07/14/2015  . OSA (obstructive sleep apnea) 09/29/2013   Currently on CPAP therapy.  4mmHG [facility].    Past Surgical History:  Procedure Laterality Date  . ABDOMINAL HYSTERECTOMY    . APPENDECTOMY    . CHOLECYSTECTOMY    . FOOT SURGERY    . KNEE ARTHROSCOPY     Right and lft.   [name]. PARATHYROIDECTOMY  04/2010   done by Dr Gerrit FriendsGerkin  . SHOULDER SURGERY     Social History   Tobacco Use  . Smoking status: Never Smoker  . Smokeless tobacco: Never Used  Substance Use Topics  . Alcohol use: No     ROS:  As above   Medications: Current Outpatient Medications  Medication Sig Dispense Refill  . lisinopril-hydrochlorothiazide (PRINZIDE,ZESTORETIC) 20-25 MG tablet Take 1 tablet by mouth daily. 90 tablet 1  . LORazepam (ATIVAN) 0.5 MG tablet Take 1-2 tabs 30-60 min prior to MRI 4 tablet 0  . Lorcaserin HCl ER (BELVIQ XR) 20 MG TB24 Take 20 mg daily by mouth. 30 tablet 3  . pravastatin (PRAVACHOL) 40 MG tablet TAKE 1 TABLET BY MOUTH EVERY DAY 30 tablet 5  . sertraline (ZOLOFT) 50 MG tablet  Take 1 tablet (50 mg total) by mouth daily. 90 tablet 1  . traMADol (ULTRAM) 50 MG tablet Take 1 tablet (50 mg total) every 8 (eight) hours as needed by mouth. 60 tablet 0   No current facility-administered medications for this visit.    Allergies  Allergen Reactions  . Ciprofloxacin   . Fluconazole   . Fluconazole In Dextrose   . Metronidazole   . Naproxen Sodium     HIVES   . Naproxen Sodium      Exam:  BP 115/70   Pulse 64   Ht 5\' 9"  (1.753 m)   Wt (!) 319 lb (144.7 kg)   BMI 47.11 kg/m  General: Well Developed, well nourished, and in no acute distress.  Neuro/Psych: Alert and oriented x3, extra-ocular muscles intact, able to move all 4 extremities, sensation grossly intact. Skin: Warm and dry, no rashes noted.  Respiratory: Not using accessory muscles, speaking in full sentences, trachea midline.  Cardiovascular: Pulses palpable, no extremity edema. Abdomen: Does not appear distended. MSK: MSK: Slumped posture with poor trunk control. L-spine nontender to midline. Diffusely tender left lower lumbar paraspinal muscle group. Back motion limited. Antalgic gait.     No results found for this or any previous visit (from the past 48 hour(s)). Mr Lumbar Spine Wo Contrast  Result Date: 12/16/2016 CLINICAL DATA:  Low back with buttock pain.  Spasms. EXAM: MRI LUMBAR SPINE WITHOUT CONTRAST TECHNIQUE: Multiplanar, multisequence MR imaging of the lumbar spine was performed. No intravenous contrast was administered. COMPARISON:  Plain films 08/01/2014. FINDINGS: Segmentation:  Standard. Alignment: Degenerative scoliosis convex LEFT mid lumbar region. No subluxation. Vertebrae:  No worrisome osseous lesion. Conus medullaris and cauda equina: Conus extends to the L1-L2 Level. Conus and cauda equina appear normal. Paraspinal and other soft tissues: Unremarkable. Parapelvic renal cysts without hydronephrosis. Increased body habitus without signs of epidural lipomatosis. Disc levels:  L1-L2:  Annular bulge.  Facet arthropathy.  No impingement. L2-L3: Annular bulge/ subligamentous protrusion. Facet arthropathy. No impingement. L3-L4: Asymmetric loss of interspace height on the RIGHT contributes to levoconvex scoliosis. There is annular bulge. Far-lateral protrusion extends to the RIGHT foramen. There is facet arthropathy. Slight RIGHT subarticular zone and foraminal zone narrowing not clearly compressive. L4-L5: Unremarkable disc space. Facet arthropathy. No impingement. L5-S1: Unremarkable disc space. Facet arthropathy. No impingement. IMPRESSION: Lumbar spondylosis as described. No spinal stenosis or compressive lesion. Electronically Signed   By: Elsie StainJohn T Curnes M.D.   On: 12/16/2016 11:45      Assessment and Plan: 61 y.o. female with facet DJD. The worst appears to be L4-L5 and L5-S1 bilaterally. If good but not lasting response we will proceed with ablation.     No orders of the defined types were placed in this encounter.  No orders of the defined types were placed in this encounter.   Discussed warning signs or symptoms. Please see discharge instructions. Patient expresses understanding.  I spent 25 minutes with this patient, greater than 50% was face-to-face time counseling regarding ddx and treatment plan.

## 2017-01-01 ENCOUNTER — Ambulatory Visit
Admission: RE | Admit: 2017-01-01 | Discharge: 2017-01-01 | Disposition: A | Discharge status: Home / Self Care | Payer: BLUE CROSS/BLUE SHIELD | Source: Ambulatory Visit | Attending: Family Medicine | Admitting: Family Medicine

## 2017-01-01 ENCOUNTER — Other Ambulatory Visit: Payer: BLUE CROSS/BLUE SHIELD

## 2017-01-01 DIAGNOSIS — G894 Chronic pain syndrome: Secondary | ICD-10-CM

## 2017-01-01 DIAGNOSIS — M5136 Other intervertebral disc degeneration, lumbar region: Secondary | ICD-10-CM

## 2017-01-01 DIAGNOSIS — M47817 Spondylosis without myelopathy or radiculopathy, lumbosacral region: Secondary | ICD-10-CM | POA: Diagnosis not present

## 2017-01-01 MED ORDER — IOPAMIDOL (ISOVUE-M 200) INJECTION 41%
1.0000 mL | Freq: Once | INTRAMUSCULAR | Status: AC
  Administered 2017-01-01: 1 mL via INTRA_ARTICULAR

## 2017-01-01 MED ORDER — METHYLPREDNISOLONE ACETATE 40 MG/ML INJ SUSP (RADIOLOG
120.0000 mg | Freq: Once | INTRAMUSCULAR | Status: AC
  Administered 2017-01-01: 120 mg via INTRA_ARTICULAR

## 2017-01-01 NOTE — Discharge Instructions (Signed)

## 2017-02-18 ENCOUNTER — Encounter: Payer: Self-pay | Admitting: Family Medicine

## 2017-02-18 ENCOUNTER — Ambulatory Visit: Payer: BLUE CROSS/BLUE SHIELD | Admitting: Family Medicine

## 2017-02-18 VITALS — BP 123/78 | HR 66 | Ht 69.0 in | Wt 322.0 lb

## 2017-02-18 DIAGNOSIS — M5136 Other intervertebral disc degeneration, lumbar region: Secondary | ICD-10-CM | POA: Diagnosis not present

## 2017-02-18 DIAGNOSIS — M51369 Other intervertebral disc degeneration, lumbar region without mention of lumbar back pain or lower extremity pain: Secondary | ICD-10-CM

## 2017-02-18 NOTE — Patient Instructions (Signed)
Thank you for coming in today. You should hear from [facility] about nerve ablation.  Let me know if you do not hear anything,.  Goal is for around 6 months of pain relief.

## 2017-02-18 NOTE — Progress Notes (Signed)
Alicia Davies is a 62 y.o. female who presents to [facility] today for back pain. Alicia Davies was seen in December for back pain and had bilateral facet injections at L4-L5 and L5-S1.  This worked well and provided good pain relief for a few weeks.  She is back to her baseline chronic pain which she finds uncomfortable and difficult to manage.   Past Medical History:  Diagnosis Date  . Chronic pain 12/02/2016  . DDD (degenerative disc disease), lumbar 09/05/2016  . Depression 07/21/2013  . Essential hypertension, benign 07/21/2013  . Hyperlipidemia 07/21/2013  . Hyperparathyroidism   . Kidney stones   . Morbid obesity (HCC) 07/14/2015  . OSA (obstructive sleep apnea) 09/29/2013   Currently on CPAP therapy.  [facility].    Past Surgical History:  Procedure Laterality Date  . ABDOMINAL HYSTERECTOMY    . APPENDECTOMY    . CHOLECYSTECTOMY    . FOOT SURGERY    . KNEE ARTHROSCOPY     Right and lft.   [name] PARATHYROIDECTOMY  04/2010   done by Dr Gerrit Friends  . SHOULDER SURGERY     Social History   Tobacco Use  . Smoking status: Never Smoker  . Smokeless tobacco: Never Used  Substance Use Topics  . Alcohol use: No     ROS:  As above   Medications: Current Outpatient Medications  Medication Sig Dispense Refill  . lisinopril-hydrochlorothiazide (PRINZIDE,ZESTORETIC) 20-25 MG tablet Take 1 tablet by mouth daily. 90 tablet 1  . Lorcaserin HCl ER (BELVIQ XR) 20 MG TB24 Take 20 mg daily by mouth. 30 tablet 3  . pravastatin (PRAVACHOL) 40 MG tablet TAKE 1 TABLET BY MOUTH EVERY DAY 30 tablet 5  . sertraline (ZOLOFT) 50 MG tablet Take 1 tablet (50 mg total) by mouth daily. 90 tablet 1   No current facility-administered medications for this visit.    Allergies  Allergen Reactions  . Ciprofloxacin   . Fluconazole   . Fluconazole In Dextrose   . Metronidazole   . Naproxen Sodium     HIVES   . Naproxen Sodium      Exam:  BP 123/78   Pulse  66   Ht 5\' 9"  (1.753 m)   Wt (!) 322 lb (146.1 kg)   BMI 47.55 kg/m   Wt Readings from Last 5 Encounters:  02/18/17 (!) 322 lb (146.1 kg)  12/17/16 (!) 319 lb (144.7 kg)  12/02/16 (!) 320 lb 6.4 oz (145.3 kg)  11/29/16 (!) 319 lb (144.7 kg)  10/11/16 (!) 345 lb (156.5 kg)    General: Well Developed, well nourished, and in no acute distress.  Neuro/Psych: Alert and oriented x3, extra-ocular muscles intact, able to move all 4 extremities, sensation grossly intact. Skin: Warm and dry, no rashes noted.  Respiratory: Not using accessory muscles, speaking in full sentences, trachea midline.  Cardiovascular: Pulses palpable, no extremity edema. Abdomen: Does not appear distended. MSK: L-spine: Nontender to midline decreased range of motion mild antalgic gait    No results found for this or any previous visit (from the past 48 hour(s)). No results found.    Assessment and Plan: 62 y.o. female with  Chronic low back pain.  Good but temporary response to facet injections.  We will proceed with ablation.  Will focus on the left as it seems to be the worst side.    No orders of the defined types were placed in this encounter.  No orders of the defined types  were placed in this encounter.   Discussed warning signs or symptoms. Please see discharge instructions. Patient expresses understanding.  I spent 25 minutes with this patient, greater than 50% was face-to-face time counseling regarding plan including medial branch blocks and nerve ablation.

## 2017-03-04 ENCOUNTER — Telehealth: Payer: Self-pay | Admitting: Physician Assistant

## 2017-03-04 NOTE — Telephone Encounter (Signed)
Spoke to patient advised her that [facility] was contacted and they stated that they were going to call the patient to schedule her. Patient was provided with their number in case she needs to contact them. Alicia Davies,CMA

## 2017-03-04 NOTE — Telephone Encounter (Signed)
Pt called to check on referral for Nerve Ablation.

## 2017-03-14 DIAGNOSIS — I4892 Unspecified atrial flutter: Secondary | ICD-10-CM

## 2017-03-14 HISTORY — DX: Unspecified atrial flutter: I48.92

## 2017-03-18 DIAGNOSIS — Z9989 Dependence on other enabling machines and devices: Secondary | ICD-10-CM | POA: Diagnosis not present

## 2017-03-18 DIAGNOSIS — G4733 Obstructive sleep apnea (adult) (pediatric): Secondary | ICD-10-CM | POA: Diagnosis not present

## 2017-03-24 ENCOUNTER — Other Ambulatory Visit: Payer: Self-pay | Admitting: Family Medicine

## 2017-03-24 DIAGNOSIS — M5136 Other intervertebral disc degeneration, lumbar region: Secondary | ICD-10-CM

## 2017-03-28 ENCOUNTER — Encounter: Payer: Self-pay | Admitting: Emergency Medicine

## 2017-03-28 ENCOUNTER — Emergency Department
Admission: EM | Admit: 2017-03-28 | Discharge: 2017-03-28 | Disposition: A | Discharge status: Other Acute Inpatient Hospital | Payer: BLUE CROSS/BLUE SHIELD | Source: Home / Self Care | Attending: Family Medicine | Admitting: Family Medicine

## 2017-03-28 ENCOUNTER — Other Ambulatory Visit: Payer: Self-pay

## 2017-03-28 DIAGNOSIS — R072 Precordial pain: Secondary | ICD-10-CM | POA: Diagnosis not present

## 2017-03-28 DIAGNOSIS — Z888 Allergy status to other drugs, medicaments and biological substances status: Secondary | ICD-10-CM | POA: Diagnosis not present

## 2017-03-28 DIAGNOSIS — I4892 Unspecified atrial flutter: Secondary | ICD-10-CM

## 2017-03-28 DIAGNOSIS — Z6841 Body Mass Index (BMI) 40.0 and over, adult: Secondary | ICD-10-CM | POA: Diagnosis not present

## 2017-03-28 DIAGNOSIS — I10 Essential (primary) hypertension: Secondary | ICD-10-CM | POA: Diagnosis not present

## 2017-03-28 DIAGNOSIS — I129 Hypertensive chronic kidney disease with stage 1 through stage 4 chronic kidney disease, or unspecified chronic kidney disease: Secondary | ICD-10-CM | POA: Diagnosis not present

## 2017-03-28 DIAGNOSIS — M5136 Other intervertebral disc degeneration, lumbar region: Secondary | ICD-10-CM | POA: Diagnosis not present

## 2017-03-28 DIAGNOSIS — G4733 Obstructive sleep apnea (adult) (pediatric): Secondary | ICD-10-CM | POA: Diagnosis not present

## 2017-03-28 DIAGNOSIS — Z9989 Dependence on other enabling machines and devices: Secondary | ICD-10-CM | POA: Diagnosis not present

## 2017-03-28 DIAGNOSIS — Z79899 Other long term (current) drug therapy: Secondary | ICD-10-CM | POA: Diagnosis not present

## 2017-03-28 DIAGNOSIS — E669 Obesity, unspecified: Secondary | ICD-10-CM | POA: Diagnosis not present

## 2017-03-28 DIAGNOSIS — Z885 Allergy status to narcotic agent status: Secondary | ICD-10-CM | POA: Diagnosis not present

## 2017-03-28 DIAGNOSIS — R000 Tachycardia, unspecified: Secondary | ICD-10-CM | POA: Diagnosis not present

## 2017-03-28 DIAGNOSIS — R0602 Shortness of breath: Secondary | ICD-10-CM | POA: Diagnosis not present

## 2017-03-28 DIAGNOSIS — Z7901 Long term (current) use of anticoagulants: Secondary | ICD-10-CM | POA: Diagnosis not present

## 2017-03-28 DIAGNOSIS — Z96652 Presence of left artificial knee joint: Secondary | ICD-10-CM | POA: Diagnosis not present

## 2017-03-28 DIAGNOSIS — N183 Chronic kidney disease, stage 3 (moderate): Secondary | ICD-10-CM | POA: Diagnosis not present

## 2017-03-28 MED ORDER — RIVAROXABAN 20 MG PO TABS
20.00 | ORAL_TABLET | ORAL | Status: DC
Start: 2017-03-29 — End: 2017-03-28

## 2017-03-28 MED ORDER — GENERIC EXTERNAL MEDICATION
Status: DC
Start: ? — End: 2017-03-28

## 2017-03-28 MED ORDER — POLYETHYLENE GLYCOL 3350 17 G PO PACK
17.00 | PACK | ORAL | Status: DC
Start: ? — End: 2017-03-28

## 2017-03-28 MED ORDER — CLONIDINE HCL 0.1 MG PO TABS
0.10 | ORAL_TABLET | ORAL | Status: DC
Start: ? — End: 2017-03-28

## 2017-03-28 MED ORDER — SERTRALINE HCL 50 MG PO TABS
50.00 | ORAL_TABLET | ORAL | Status: DC
Start: 2017-03-30 — End: 2017-03-28

## 2017-03-28 MED ORDER — ACETAMINOPHEN 325 MG PO TABS
650.00 | ORAL_TABLET | ORAL | Status: DC
Start: ? — End: 2017-03-28

## 2017-03-28 MED ORDER — ACETAMINOPHEN 650 MG RE SUPP
650.00 | RECTAL | Status: DC
Start: ? — End: 2017-03-28

## 2017-03-28 MED ORDER — ALBUTEROL SULFATE (2.5 MG/3ML) 0.083% IN NEBU
2.50 | INHALATION_SOLUTION | RESPIRATORY_TRACT | Status: DC
Start: ? — End: 2017-03-28

## 2017-03-28 MED ORDER — NITROGLYCERIN 0.4 MG SL SUBL
0.40 | SUBLINGUAL_TABLET | SUBLINGUAL | Status: DC
Start: ? — End: 2017-03-28

## 2017-03-28 MED ORDER — ASPIRIN 81 MG PO CHEW
324.0000 mg | CHEWABLE_TABLET | Freq: Once | ORAL | Status: AC
  Administered 2017-03-28: 324 mg via ORAL

## 2017-03-28 MED ORDER — PRAVASTATIN SODIUM 40 MG PO TABS
40.00 | ORAL_TABLET | ORAL | Status: DC
Start: 2017-03-30 — End: 2017-03-28

## 2017-03-28 MED ORDER — BENZONATATE 100 MG PO CAPS
100.00 | ORAL_CAPSULE | ORAL | Status: DC
Start: ? — End: 2017-03-28

## 2017-03-28 NOTE — ED Triage Notes (Signed)
Pt c/o heart racing, chest "feels weird" and SOB that started last night. She has not taken any new meds. Last ekg 2015.

## 2017-03-29 DIAGNOSIS — G4733 Obstructive sleep apnea (adult) (pediatric): Secondary | ICD-10-CM | POA: Diagnosis not present

## 2017-03-29 DIAGNOSIS — Z6841 Body Mass Index (BMI) 40.0 and over, adult: Secondary | ICD-10-CM | POA: Diagnosis not present

## 2017-03-29 DIAGNOSIS — N183 Chronic kidney disease, stage 3 (moderate): Secondary | ICD-10-CM | POA: Diagnosis not present

## 2017-03-29 DIAGNOSIS — I081 Rheumatic disorders of both mitral and tricuspid valves: Secondary | ICD-10-CM | POA: Diagnosis not present

## 2017-03-29 DIAGNOSIS — I517 Cardiomegaly: Secondary | ICD-10-CM | POA: Diagnosis not present

## 2017-03-29 DIAGNOSIS — I4892 Unspecified atrial flutter: Secondary | ICD-10-CM | POA: Diagnosis not present

## 2017-03-30 DIAGNOSIS — N183 Chronic kidney disease, stage 3 (moderate): Secondary | ICD-10-CM | POA: Diagnosis not present

## 2017-03-30 DIAGNOSIS — M5136 Other intervertebral disc degeneration, lumbar region: Secondary | ICD-10-CM | POA: Diagnosis not present

## 2017-03-30 DIAGNOSIS — I4892 Unspecified atrial flutter: Secondary | ICD-10-CM | POA: Diagnosis not present

## 2017-03-30 DIAGNOSIS — I10 Essential (primary) hypertension: Secondary | ICD-10-CM | POA: Diagnosis not present

## 2017-03-30 DIAGNOSIS — Z6841 Body Mass Index (BMI) 40.0 and over, adult: Secondary | ICD-10-CM | POA: Diagnosis not present

## 2017-03-30 MED ORDER — RIVAROXABAN 20 MG PO TABS
20.00 | ORAL_TABLET | ORAL | Status: DC
Start: 2017-03-30 — End: 2017-03-30

## 2017-03-30 MED ORDER — GENERIC EXTERNAL MEDICATION
Status: DC
Start: ? — End: 2017-03-30

## 2017-03-30 MED ORDER — GENERIC EXTERNAL MEDICATION
10.00 | Status: DC
Start: 2017-03-30 — End: 2017-03-30

## 2017-03-30 MED ORDER — SODIUM CHLORIDE 0.9 % IV SOLN
INTRAVENOUS | Status: DC
Start: ? — End: 2017-03-30

## 2017-03-30 MED ORDER — DILTIAZEM HCL 60 MG PO TABS
60.00 | ORAL_TABLET | ORAL | Status: DC
Start: 2017-03-30 — End: 2017-03-30

## 2017-03-31 MED ORDER — GENERIC EXTERNAL MEDICATION
Status: DC
Start: ? — End: 2017-03-31

## 2017-03-31 NOTE — ED Provider Notes (Signed)
[name]    CSN: [id number] Arrival date & time: 03/28/17  1207     History   Chief Complaint Chief Complaint  Patient presents with  . Shortness of Breath  . Tachycardia    HPI Alicia Davies is a 62 y.o. female.   Last night patient suddenly felt her heart racing, with onset of non-radiating constant chest pressure.  She has had constant sensation of shortness of breath.  No lower leg pain or swelling.  She has hypertension for which she takes Prinzide 20-25.  No previous history of atrial fib or flutter.   The history is provided by the patient.  Chest Pain  Pain location:  Substernal area Pain quality: aching   Pain radiates to:  Does not radiate Pain severity:  Mild Onset quality:  Sudden Duration:  1 day Timing:  Constant Progression:  Unchanged Chronicity:  New Context: at rest   Context: not breathing and not movement   Relieved by:  Nothing Worsened by:  Nothing Ineffective treatments:  None tried Associated symptoms: fatigue, palpitations and shortness of breath   Associated symptoms: no abdominal pain, no altered mental status, no back pain, no claudication, no cough, no diaphoresis, no dizziness, no dysphagia, no fever, no heartburn, no lower extremity edema, no nausea, no near-syncope, no orthopnea, no PND, no syncope, no vomiting and no weakness     Past Medical History:  Diagnosis Date  . Chronic pain 12/02/2016  . DDD (degenerative disc disease), lumbar 09/05/2016  . Depression 07/21/2013  . Essential hypertension, benign 07/21/2013  . Hyperlipidemia 07/21/2013  . Hyperparathyroidism   . Kidney stones   . Morbid obesity (HCC) 07/14/2015  . OSA (obstructive sleep apnea) 09/29/2013   Currently on CPAP therapy.  [facility].     Patient Active Problem List   Diagnosis Date Noted  . Chronic pain 12/02/2016  . DDD (degenerative disc disease), lumbar 09/05/2016  . CKD (chronic kidney disease) stage 3, GFR 30-59 ml/min (HCC) 10/15/2015    . Seborrheic keratoses 07/14/2015  . Morbid obesity (HCC) 07/14/2015  . Hyperglycemia 06/10/2014  . S/P right knee arthroscopy 02/28/2014  . OSA (obstructive sleep apnea) 09/29/2013  . Depression 07/21/2013  . Essential hypertension, benign 07/21/2013  . Hyperlipidemia 07/21/2013  . Benign cyst of left breast 10/28/2012  . Right knee pain 08/24/2010  . Primary hyperparathyroidism (HCC) 01/29/2010  . POSTMENOPAUSAL STATUS 01/17/2010  . B12 DEFICIENCY 12/11/2009  . HYPERCALCEMIA 12/06/2009  . UNSPECIFIED VITAMIN D DEFICIENCY 12/05/2009  . MENOPAUSAL SYNDROME 12/05/2009  . FATIGUE 12/05/2009    Past Surgical History:  Procedure Laterality Date  . ABDOMINAL HYSTERECTOMY    . APPENDECTOMY    . CHOLECYSTECTOMY    . FOOT SURGERY    . KNEE ARTHROSCOPY     Right and lft.   [name] PARATHYROIDECTOMY  04/2010   done by Dr Gerrit Friends  . SHOULDER SURGERY      OB History    No data available       Home Medications    Prior to Admission medications   Medication Sig Start Date End Date Taking? Authorizing Provider  lisinopril-hydrochlorothiazide (PRINZIDE,ZESTORETIC) 20-25 MG tablet Take 1 tablet by mouth daily. 09/02/16   Jomarie Longs, PA-C  Lorcaserin HCl ER (BELVIQ XR) 20 MG TB24 Take 20 mg daily by mouth. 12/02/16   Rodolph Bong, MD  pravastatin (PRAVACHOL) 40 MG tablet TAKE 1 TABLET BY MOUTH EVERY DAY 12/01/16   Breeback, Jade L, PA-C  sertraline (ZOLOFT) 50  MG tablet Take 1 tablet (50 mg total) by mouth daily. 09/02/16   Jomarie Longs, PA-C    Family History Family History  Problem Relation Age of Onset  . Diabetes Mother   . Heart failure Mother   . Diabetes Brother   . Heart failure Brother     Social History Social History   Tobacco Use  . Smoking status: Never Smoker  . Smokeless tobacco: Never Used  Substance Use Topics  . Alcohol use: No  . Drug use: No     Allergies   Ciprofloxacin; Fluconazole; Fluconazole in dextrose; Metronidazole; Naproxen sodium;  and Naproxen sodium   Review of Systems Review of Systems  Constitutional: Positive for fatigue. Negative for diaphoresis and fever.  HENT: Negative for trouble swallowing.   Respiratory: Positive for shortness of breath. Negative for cough.   Cardiovascular: Positive for chest pain and palpitations. Negative for orthopnea, claudication, syncope, PND and near-syncope.  Gastrointestinal: Negative for abdominal pain, heartburn, nausea and vomiting.  Musculoskeletal: Negative for back pain.  Neurological: Negative for dizziness and weakness.     Physical Exam Triage Vital Signs ED Triage Vitals  Enc Vitals Group     BP 03/28/17 1346 126/78     Pulse Rate 03/28/17 1346 (!) 140     Resp --      Temp 03/28/17 1346 97.6 F (36.4 C)     Temp Source 03/28/17 1346 Oral     SpO2 03/28/17 1346 99 %     Weight 03/28/17 1349 (!) 323 lb (146.5 kg)     Height --      Head Circumference --      Peak Flow --      Pain Score 03/28/17 1348 0     Pain Loc --      Pain Edu? --      Excl. in GC? --    No data found.  Updated Vital Signs BP 126/78 (BP Location: Right Arm)   Pulse (!) 140   Temp 97.6 F (36.4 C) (Oral)   Wt (!) 323 lb (146.5 kg)   SpO2 99%   BMI 47.70 kg/m   Visual Acuity Right Eye Distance:   Left Eye Distance:   Bilateral Distance:    Right Eye Near:   Left Eye Near:    Bilateral Near:     Physical Exam Nursing notes and Vital Signs reviewed. Appearance:  Patient appears stated age, and anxious but in no acute distress.   She is alert and oriented.  Eyes:  Pupils are equal, round, and reactive to light and accomodation.  Extraocular movement is intact.   Pharynx:  Normal; moist mucous membranes  Neck:  Supple.  No adenopathy Lungs:  Clear to auscultation.  Breath sounds are equal.  Moving air well. Heart:   Tachycardia, rate 140 Abdomen:  Nontender. Extremities:  No edema.  Skin:  No rash present.     UC Treatments / Results  Labs (all labs ordered are  listed, but only abnormal results are displayed) Labs Reviewed - No data to display  EKG  EKG Interpretation  Rate:  140 BPM PR:   msec QT:  328 msec QTcH:  500 msec QRSD:  84 msec QRS axis:  -23 Degrees Interpretation:   Atrial flutter with variable AV block; marked ST abnormalities.  Reviewed previous normal EKG dated 26 Nov 2013        Radiology No results found.  Procedures Procedures (including critical care time)  Medications Ordered  in UC Medications  aspirin chewable tablet 324 mg (324 mg Oral Given 03/28/17 1352)     Initial Impression / Assessment and Plan / UC Course  I have reviewed the triage vital signs and the nursing notes.  Pertinent labs & imaging results that were available during my care of the patient were reviewed by me and considered in my medical decision making (see chart for details).    New onset atrial flutter with ventricular rate 140 Administered aspirin 324mg  PO.  O2 by nasal cannula. [facility].  Patient stable at discharge.   Final Clinical Impressions(s) / UC Diagnoses   Final diagnoses:  Precordial pain  Atrial flutter, unspecified type [facility](HCC)    ED Discharge Orders    None           Lattie HawBeese, Lulani Bour A, MD 03/31/17 1810

## 2017-04-07 ENCOUNTER — Encounter: Payer: Self-pay | Admitting: Physician Assistant

## 2017-04-07 ENCOUNTER — Ambulatory Visit: Payer: BLUE CROSS/BLUE SHIELD | Admitting: Physician Assistant

## 2017-04-07 VITALS — BP 111/46 | HR 53 | Ht 69.0 in | Wt 333.0 lb

## 2017-04-07 DIAGNOSIS — N183 Chronic kidney disease, stage 3 unspecified: Secondary | ICD-10-CM

## 2017-04-07 DIAGNOSIS — I4892 Unspecified atrial flutter: Secondary | ICD-10-CM

## 2017-04-07 DIAGNOSIS — R7989 Other specified abnormal findings of blood chemistry: Secondary | ICD-10-CM

## 2017-04-07 DIAGNOSIS — R600 Localized edema: Secondary | ICD-10-CM

## 2017-04-07 MED ORDER — FUROSEMIDE 20 MG PO TABS: 20.0000 mg | ORAL_TABLET | Freq: Every day | ORAL | 0 refills | Status: DC

## 2017-04-07 NOTE — Progress Notes (Signed)
Subjective:    Patient ID: Alicia Davies, female    DOB: [birth year], 62 y.o.   MRN: [id number]  HPI  Pt is a 62 yo morbidly obese female with HTN and new onset atrial flutter with RVR on 03/28/17.  Pt cardioconverted on her own with IV diltazem.  Her serum creatine was 1.40 but decreased to 1.20 by discharge.   Scheduling Instructions: 1. Stop your home blood pressure medications (hydrochlorothiazide/lisinopril) 2. Add Cardizem 240 mg by mouth daily for your blood pressure and heart rate control 3. Add Xarelto 20 mg po daily for prevention of stroke 4. Follow up with your PCP in one week 5. Follow up with [facility] in 2 weeks 6. Referral sent to your PCP and [facility] 7. Monitor your HR and BP at home. Call your PCP if HR < 50 and/or BP < 90/60.   Pt is only taking 120mg  of cardizem.   BP at home has been around 110's over 60's. She denies any dizziness. She has follow up with cardiology on April 1st. She has not felt any palpitations or abnormal rhythm. She does feel like she is swelling more since her BP medications with HCTZ in it was stopped. Most of swelling in legs/ankles.   .. Active Ambulatory Problems    Diagnosis Date Noted  . B12 DEFICIENCY 12/11/2009  . UNSPECIFIED VITAMIN D DEFICIENCY 12/05/2009  . HYPERCALCEMIA 12/06/2009  . MENOPAUSAL SYNDROME 12/05/2009  . FATIGUE 12/05/2009  . Primary hyperparathyroidism (HCC) 01/29/2010  . POSTMENOPAUSAL STATUS 01/17/2010  . Right knee pain 08/24/2010  . Benign cyst of left breast 10/28/2012  . Depression 07/21/2013  . Essential hypertension, benign 07/21/2013  . Hyperlipidemia 07/21/2013  . OSA (obstructive sleep apnea) 09/29/2013  . S/P right knee arthroscopy 02/28/2014  . Hyperglycemia 06/10/2014  . Seborrheic keratoses 07/14/2015  . Morbid obesity (HCC) 07/14/2015  . CKD (chronic kidney disease) stage 3, GFR 30-59 ml/min (HCC) 10/15/2015  . DDD (degenerative disc disease), lumbar 09/05/2016   . Chronic pain 12/02/2016  . Atrial flutter with rapid ventricular response (HCC) 04/07/2017   Resolved Ambulatory Problems    Diagnosis Date Noted  . Obesity, unspecified 12/05/2009  . Severe obstructive sleep apnea 10/12/2012  . Severe obesity (BMI >= 40) (HCC) 07/21/2013  . Swelling of lower extremity 07/21/2013  . Muscle spasm 03/22/2014  . Piriformis syndrome of left side 07/14/2015  . Viral upper respiratory infection 01/26/2016   Past Medical History:  Diagnosis Date  . Chronic pain 12/02/2016  . DDD (degenerative disc disease), lumbar 09/05/2016  . Depression 07/21/2013  . Essential hypertension, benign 07/21/2013  . Hyperlipidemia 07/21/2013  . Hyperparathyroidism   . Kidney stones   . Morbid obesity (HCC) 07/14/2015  . OSA (obstructive sleep apnea) 09/29/2013      Review of Systems See HPI.     Objective:   Physical Exam  Constitutional: She is oriented to person, place, and time. She appears well-developed and well-nourished.  Obese.   HENT:  Head: Normocephalic and atraumatic.  Cardiovascular: Regular rhythm and normal heart sounds.  Bradycardia.   Pulmonary/Chest: Effort normal and breath sounds normal. She has no wheezes.  Neurological: She is alert and oriented to person, place, and time.  Skin:  1+ pitting edema of bilateral lower ankles.   Psychiatric: She has a normal mood and affect. Her behavior is normal.          Assessment & Plan:  [name].[name].Alicia Davies was seen today for hospitalization follow-up.  Diagnoses and  all orders for this visit:  Atrial flutter with rapid ventricular response (HCC) -     BASIC METABOLIC PANEL WITH GFR  Elevated serum creatinine -     BASIC METABOLIC PANEL WITH GFR  Edema of both lower extremities -     furosemide (LASIX) 20 MG tablet; Take 1 tablet (20 mg total) by mouth daily. prn  Morbid obesity (HCC)  CKD (chronic kidney disease) stage 3, GFR 30-59 ml/min (HCC)   BP a little low. Keep watch on this at home. Make  sure staying hydrated. If diastolic drops below 60 regularly let us know.   Added lasix as needed for bilateral lower extremity edema. Do not use every day and do not use if BP below 90/60. Will check BMP to follow up after hospital elevation. Baseline CKD is 1.20's.   Once we get this under control then can get back injections and work on weight loss.

## 2017-04-08 LAB — BASIC METABOLIC PANEL WITH GFR
BUN / CREAT RATIO: 14 (calc) (ref 6–22)
BUN: 17 mg/dL (ref 7–25)
CHLORIDE: 105 mmol/L (ref 98–110)
CO2: 30 mmol/L (ref 20–32)
Calcium: 9.3 mg/dL (ref 8.6–10.4)
Creat: 1.23 mg/dL — ABNORMAL HIGH (ref 0.50–1.05)
GFR, EST AFRICAN AMERICAN: 56 mL/min/{1.73_m2} — AB (ref >=60)
GFR, Est Non African American: 48 mL/min/{1.73_m2} — ABNORMAL LOW (ref >=60)
Glucose, Bld: 99 mg/dL (ref 65–99)
POTASSIUM: 4.4 mmol/L (ref 3.5–5.3)
SODIUM: 140 mmol/L (ref 135–146)

## 2017-04-08 NOTE — Progress Notes (Signed)
Call pt: creatine improving from ED visit.

## 2017-04-11 ENCOUNTER — Other Ambulatory Visit: Payer: BLUE CROSS/BLUE SHIELD

## 2017-04-14 DIAGNOSIS — I4892 Unspecified atrial flutter: Secondary | ICD-10-CM | POA: Diagnosis not present

## 2017-04-14 DIAGNOSIS — Z9989 Dependence on other enabling machines and devices: Secondary | ICD-10-CM | POA: Diagnosis not present

## 2017-04-14 DIAGNOSIS — G4733 Obstructive sleep apnea (adult) (pediatric): Secondary | ICD-10-CM | POA: Diagnosis not present

## 2017-04-14 DIAGNOSIS — I10 Essential (primary) hypertension: Secondary | ICD-10-CM | POA: Diagnosis not present

## 2017-04-19 ENCOUNTER — Encounter: Payer: Self-pay | Admitting: Emergency Medicine

## 2017-04-19 ENCOUNTER — Emergency Department (INDEPENDENT_AMBULATORY_CARE_PROVIDER_SITE_OTHER): Payer: BLUE CROSS/BLUE SHIELD

## 2017-04-19 ENCOUNTER — Emergency Department
Admission: EM | Admit: 2017-04-19 | Discharge: 2017-04-19 | Disposition: A | Discharge status: Home / Self Care | Payer: BLUE CROSS/BLUE SHIELD | Source: Home / Self Care | Attending: Family Medicine | Admitting: Family Medicine

## 2017-04-19 DIAGNOSIS — M25552 Pain in left hip: Secondary | ICD-10-CM

## 2017-04-19 DIAGNOSIS — R52 Pain, unspecified: Secondary | ICD-10-CM

## 2017-04-19 DIAGNOSIS — S76012A Strain of muscle, fascia and tendon of left hip, initial encounter: Secondary | ICD-10-CM

## 2017-04-19 NOTE — ED Provider Notes (Signed)
[facility]    CSN: [id number] Arrival date & time: 04/19/17  1424     History   Chief Complaint Chief Complaint  Patient presents with  . Hip Pain    HPI Alicia Davies is a 62 y.o. female.   While walking into [employer] yesterday patient felt a painful popping sensation in her left hip area, followed by pain with ambulation that has persisted.  Today she again felt a popping sensation and the pain became worse.  The history is provided by the patient.  Hip Pain  This is a new problem. The current episode started yesterday. The problem occurs constantly. The problem has been gradually worsening. Pertinent negatives include no abdominal pain. The symptoms are aggravated by walking and standing. Nothing relieves the symptoms. She has tried nothing for the symptoms.    Past Medical History:  Diagnosis Date  . Chronic pain 12/02/2016  . DDD (degenerative disc disease), lumbar 09/05/2016  . Depression 07/21/2013  . Essential hypertension, benign 07/21/2013  . Hyperlipidemia 07/21/2013  . Hyperparathyroidism   . Kidney stones   . Morbid obesity (HCC) 07/14/2015  . OSA (obstructive sleep apnea) 09/29/2013   Currently on CPAP therapy.  [facility].     Patient Active Problem List   Diagnosis Date Noted  . Atrial flutter with rapid ventricular response (HCC) 04/07/2017  . Chronic pain 12/02/2016  . DDD (degenerative disc disease), lumbar 09/05/2016  . CKD (chronic kidney disease) stage 3, GFR 30-59 ml/min (HCC) 10/15/2015  . Seborrheic keratoses 07/14/2015  . Morbid obesity (HCC) 07/14/2015  . Hyperglycemia 06/10/2014  . S/P right knee arthroscopy 02/28/2014  . OSA (obstructive sleep apnea) 09/29/2013  . Depression 07/21/2013  . Essential hypertension, benign 07/21/2013  . Hyperlipidemia 07/21/2013  . Benign cyst of left breast 10/28/2012  . Right knee pain 08/24/2010  . Primary hyperparathyroidism (HCC) 01/29/2010  . POSTMENOPAUSAL STATUS 01/17/2010  . B12  DEFICIENCY 12/11/2009  . HYPERCALCEMIA 12/06/2009  . UNSPECIFIED VITAMIN D DEFICIENCY 12/05/2009  . MENOPAUSAL SYNDROME 12/05/2009  . FATIGUE 12/05/2009    Past Surgical History:  Procedure Laterality Date  . ABDOMINAL HYSTERECTOMY    . APPENDECTOMY    . CHOLECYSTECTOMY    . FOOT SURGERY    . KNEE ARTHROSCOPY     Right and lft.   [name] PARATHYROIDECTOMY  04/2010   done by Dr Gerrit Friends  . SHOULDER SURGERY      OB History   None      Home Medications    Prior to Admission medications   Medication Sig Start Date End Date Taking? Authorizing Provider  diltiazem (CARDIZEM CD) 120 MG 24 hr capsule Take by mouth. 03/30/17   [provider]  furosemide (LASIX) 20 MG tablet Take 1 tablet (20 mg total) by mouth daily. prn 04/07/17   Breeback, Jade L, PA-C  pravastatin (PRAVACHOL) 40 MG tablet TAKE 1 TABLET BY MOUTH EVERY DAY 12/01/16   Breeback, Jade L, PA-C  sertraline (ZOLOFT) 50 MG tablet Take 1 tablet (50 mg total) by mouth daily. 09/02/16   Breeback, Jade L, PA-C  XARELTO 20 MG TABS tablet TK 1 T PO D UTD 03/30/17   [provider]    Family History Family History  Problem Relation Age of Onset  . Diabetes Mother   . Heart failure Mother   . Diabetes Brother   . Heart failure Brother     Social History Social History   Tobacco Use  . Smoking status: Never Smoker  .  Smokeless tobacco: Never Used  Substance Use Topics  . Alcohol use: No  . Drug use: No     Allergies   Ciprofloxacin; Fluconazole; Fluconazole in dextrose; Metronidazole; Naproxen sodium; and Naproxen sodium   Review of Systems Review of Systems  Gastrointestinal: Negative for abdominal pain.  All other systems reviewed and are negative.    Physical Exam Triage Vital Signs ED Triage Vitals  Enc Vitals Group     BP 04/19/17 1520 (!) 145/79     Pulse Rate 04/19/17 1520 (!) 52     Resp 04/19/17 1520 18     Temp 04/19/17 1520 (!) 97.3 F (36.3 C)     Temp Source 04/19/17 1520 Oral       SpO2 04/19/17 1520 96 %     Weight 04/19/17 1521 (!) 331 lb (150.1 kg)     Height 04/19/17 1521 5\' 9"  (1.753 m)     Head Circumference --      Peak Flow --      Pain Score 04/19/17 1521 6     Pain Loc --      Pain Edu? --      Excl. in GC? --    No data found.  Updated Vital Signs BP (!) 145/79 (BP Location: Right Arm)   Pulse (!) 52   Temp (!) 97.3 F (36.3 C) (Oral)   Resp 18   Ht 5\' 9"  (1.753 m)   Wt (!) 331 lb (150.1 kg)   SpO2 96%   BMI 48.88 kg/m   Visual Acuity Right Eye Distance:   Left Eye Distance:   Bilateral Distance:    Right Eye Near:   Left Eye Near:    Bilateral Near:     Physical Exam  Constitutional: She appears well-developed and well-nourished. No distress.  HENT:  Head: Normocephalic.  Eyes: Pupils are equal, round, and reactive to light.  Cardiovascular: Normal rate.  Pulmonary/Chest: Effort normal.  Abdominal: Soft.  Musculoskeletal:       Left hip: She exhibits decreased range of motion, decreased strength and tenderness. She exhibits no bony tenderness, no swelling, no crepitus and no deformity.       Legs: There is distinct tenderness to palpation over left hip flexors.  Patient unable to actively flex her left hip.  Left hip has good passive range of motion.  Distal neurovascular function is intact.   Neurological: She is alert.  Skin: Skin is warm and dry.  Nursing note and vitals reviewed.    UC Treatments / Results  Labs (all labs ordered are listed, but only abnormal results are displayed) Labs Reviewed - No data to display  EKG None Radiology Dg Hip Unilat With Pelvis 2-3 Views Left  Result Date: 04/19/2017 CLINICAL DATA:  Patient felt a popping sensation in the LEFT hip yesterday and today while walking, associated with immediate sharp pain which localizes to the LATERAL hip. No discrete injury. EXAM: DG HIP (WITH OR WITHOUT PELVIS) 2-3V LEFT COMPARISON:  None. FINDINGS: No evidence of acute fracture or dislocation.  Well-preserved joint space. Well-preserved bone mineral density. No intrinsic osseous abnormality. Included AP pelvis demonstrates a symmetric normal-appearing contralateral RIGHT hip joint. Sacroiliac joints intact. Symphysis pubis intact with degenerative changes. IMPRESSION: 1. Normal LEFT hip. 2. Degenerative changes involving the symphysis pubis. Electronically Signed   By: Hulan Saashomas  Lawrence M.D.   On: 04/19/2017 16:16    Procedures Procedures (including critical care time)  Medications Ordered in UC Medications - No data to display  Initial Impression / Assessment and Plan / UC Course  I have reviewed the triage vital signs and the nursing notes.  Pertinent labs & imaging results that were available during my care of the patient were reviewed by me and considered in my medical decision making (see chart for details).    Apply ice pack for 20 to 30 minutes, 3 to 4 times daily  Continue until pain and swelling decrease.  May take Tylenol for pain.  Use walker or cane for walking. Because of patient's body habitus, she will probably need professional PT to facilitate recovery. Followup with Dr[facility]   Final Clinical Impressions(s) / UC Diagnoses   Final diagnoses:  Pain aggravated by changing postions  Strain of hip flexor, left, initial encounter    ED Discharge Orders    None         Lattie Haw, MD 04/20/17 [phone]

## 2017-04-19 NOTE — ED Triage Notes (Signed)
Patient states that she was walking yesterday and today and both days she felt a pop in the left hip and she had immediate sharp constant aching pain which is worse with movement.

## 2017-04-19 NOTE — Discharge Instructions (Signed)
Apply ice pack for 20 to 30 minutes, 3 to 4 times daily  Continue until pain and swelling decrease.  May take Tylenol for pain.  Use walker or cane for walking.

## 2017-05-07 ENCOUNTER — Other Ambulatory Visit: Payer: Self-pay | Admitting: *Deleted

## 2017-05-07 DIAGNOSIS — F3342 Major depressive disorder, recurrent, in full remission: Secondary | ICD-10-CM

## 2017-05-07 MED ORDER — SERTRALINE HCL 50 MG PO TABS: 50.0000 mg | ORAL_TABLET | Freq: Every day | ORAL | 1 refills | Status: DC

## 2017-05-10 ENCOUNTER — Other Ambulatory Visit: Payer: Self-pay | Admitting: Physician Assistant

## 2017-05-10 DIAGNOSIS — R600 Localized edema: Secondary | ICD-10-CM

## 2017-05-15 ENCOUNTER — Other Ambulatory Visit: Payer: BLUE CROSS/BLUE SHIELD

## 2017-05-15 ENCOUNTER — Ambulatory Visit
Admission: RE | Admit: 2017-05-15 | Discharge: 2017-05-15 | Disposition: A | Discharge status: Home / Self Care | Payer: BLUE CROSS/BLUE SHIELD | Source: Ambulatory Visit | Attending: Family Medicine | Admitting: Family Medicine

## 2017-05-15 ENCOUNTER — Other Ambulatory Visit: Payer: Self-pay | Admitting: Family Medicine

## 2017-05-15 DIAGNOSIS — M5136 Other intervertebral disc degeneration, lumbar region: Secondary | ICD-10-CM

## 2017-05-15 NOTE — Discharge Instructions (Signed)

## 2017-06-11 ENCOUNTER — Ambulatory Visit: Payer: BLUE CROSS/BLUE SHIELD | Admitting: Family Medicine

## 2017-06-11 ENCOUNTER — Encounter: Payer: Self-pay | Admitting: Family Medicine

## 2017-06-11 VITALS — BP 153/81 | HR 51 | Wt 331.0 lb

## 2017-06-11 DIAGNOSIS — M5136 Other intervertebral disc degeneration, lumbar region: Secondary | ICD-10-CM | POA: Diagnosis not present

## 2017-06-11 DIAGNOSIS — T466X5A Adverse effect of antihyperlipidemic and antiarteriosclerotic drugs, initial encounter: Secondary | ICD-10-CM | POA: Diagnosis not present

## 2017-06-11 DIAGNOSIS — M791 Myalgia, unspecified site: Secondary | ICD-10-CM

## 2017-06-11 DIAGNOSIS — G894 Chronic pain syndrome: Secondary | ICD-10-CM

## 2017-06-11 MED ORDER — PITAVASTATIN CALCIUM 2 MG PO TABS: 2.0000 mg | ORAL_TABLET | Freq: Every day | ORAL | 1 refills | Status: DC

## 2017-06-11 NOTE — Patient Instructions (Signed)
Thank you for coming in today. I will re-order the ablation.  I will send a message to Dr Benard Rink.   We will switch from Liptior to Livalo.  You should be able to get it not super expensive.   You should be able to get a coupon for [employer].  Make sure your costs are not super high before you are due for refill.   Consider cymbalta for pain as well as depression/anxiety.   Pitavastatin oral tablets What is this medicine? PITAVASTATIN (pit A va STAT in) is known as a HMG-CoA reductase inhibitor or 'statin'. It lowers the level of cholesterol and triglycerides in the blood. Diet and lifestyle changes are often used with this drug. This medicine may be used for other purposes; ask your health care provider or pharmacist if you have questions. COMMON BRAND NAME(S): Livalo What should I tell my health care provider before I take this medicine? They need to know if you have any of these conditions: - frequently drink alcoholic beverages - kidney disease - liver disease - muscle aches or weakness - other medical condition - an unusual or allergic reaction to pitavastatin, other medicines, foods, dyes, or preservatives - pregnant or trying to get pregnant - breast-feeding How should I use this medicine? Take this medicine by mouth with a glass of water. Follow the directions on the prescription label. You can take this medicine with or without food. Take your doses at regular intervals. Do not take your medicine more often than directed. Talk to your pediatrician regarding the use of this medicine in children. Special care may be needed. Overdosage: If you think you have taken too much of this medicine contact a poison control center or emergency room at once. NOTE: This medicine is only for you. Do not share this medicine with others. What if I miss a dose? If you miss a dose, take it as soon as you can. If it is almost time for your next dose, take only that dose. Do not take double or extra  doses. What may interact with this medicine? Do not take this medicine with any of the following medications: - cyclosporine - gemfibrozil - herbal medicines like red yeast rice This medicine may also interact with the following medications: - alcohol - antiviral medicines for HIV or AIDS - erythromycin - other medicines for cholesterol - rifampin - warfarin This list may not describe all possible interactions. Give your health care provider a list of all the medicines, herbs, non-prescription drugs, or dietary supplements you use. Also tell them if you smoke, drink alcohol, or use illegal drugs. Some items may interact with your medicine. What should I watch for while using this medicine? Visit your doctor or health care professional for regular check-ups. You may need regular tests to make sure your liver is working properly. Tell your doctor or health care professional right away if you get any unexplained muscle pain, tenderness, or weakness, especially if you also have a fever and tiredness. Your doctor or health care professional may tell you to stop taking this medicine if you develop muscle problems. If your muscle problems do not go away after stopping this medicine, contact your health care professional. This medicine may affect blood sugar levels. If you have diabetes, check with your doctor or health care professional before you change your diet or the dose of your diabetic medicine. This drug is only part of a total heart-health program. Your doctor or a dietician can suggest a low-cholesterol  and low-fat diet to help. Avoid alcohol and smoking, and keep a proper exercise schedule. Do not become pregnant while taking this medicine. Women should inform their doctor if they wish to become pregnant or think they might be pregnant. There is a potential for serious side effects to an unborn child. Do not breast-feed an infant while taking this medicine. Serious side effects to an unborn  child or to an infant are possible. Talk to your doctor or pharmacist for more information. What side effects may I notice from receiving this medicine? Side effects that you should report to your doctor or health care professional as soon as possible: - allergic reactions like skin rash, itching or hives, swelling of the face, lips, or tongue - dark urine - fever - joint pain - muscle cramps or pain - redness, blistering, peeling or loosening of the skin, including inside the mouth - trouble passing urine or change in the amount of urine - unusually weak or tired - yellowing of the eyes or skin Side effects that usually do not require medical attention (report to your doctor or health care professional if they continue or are bothersome): - constipation - heartburn - nausea This list may not describe all possible side effects. Call your doctor for medical advice about side effects. You may report side effects to FDA at 1-800-FDA-1088. Where should I keep my medicine? Keep out of the reach of children. Store at room temperature between 15 and 30 degrees C (59 and 86 degrees F). Protect from light. Throw away any unused medicine after the expiration date. NOTE: This sheet is a summary. It may not cover all possible information. If you have questions about this medicine, talk to your doctor, pharmacist, or health care provider.  2018 Elsevier/Gold Standard (2015-02-02 12:24:24)   Duloxetine delayed-release capsules What is this medicine? DULOXETINE (doo LOX e teen) is used to treat depression, anxiety, and different types of chronic pain. This medicine may be used for other purposes; ask your health care provider or pharmacist if you have questions. COMMON BRAND NAME(S): Cymbalta, Irenka What should I tell my health care provider before I take this medicine? They need to know if you have any of these conditions: -bipolar disorder or a family history of bipolar  disorder -glaucoma -kidney disease -liver disease -suicidal thoughts or a previous suicide attempt -taken medicines called MAOIs like Carbex, Eldepryl, Marplan, Nardil, and Parnate within 14 days -an unusual reaction to duloxetine, other medicines, foods, dyes, or preservatives -pregnant or trying to get pregnant -breast-feeding How should I use this medicine? Take this medicine by mouth with a glass of water. Follow the directions on the prescription label. Do not cut, crush or chew this medicine. You can take this medicine with or without food. Take your medicine at regular intervals. Do not take your medicine more often than directed. Do not stop taking this medicine suddenly except upon the advice of your doctor. Stopping this medicine too quickly may cause serious side effects or your condition may worsen. A special MedGuide will be given to you by the pharmacist with each prescription and refill. Be sure to read this information carefully each time. Talk to your pediatrician regarding the use of this medicine in children. While this drug may be prescribed for children as young as 61 years of age for selected conditions, precautions do apply. Overdosage: If you think you have taken too much of this medicine contact a poison control center or emergency room at once. NOTE:  This medicine is only for you. Do not share this medicine with others. What if I miss a dose? If you miss a dose, take it as soon as you can. If it is almost time for your next dose, take only that dose. Do not take double or extra doses. What may interact with this medicine? Do not take this medicine with any of the following medications: -desvenlafaxine -levomilnacipran -linezolid -MAOIs like Carbex, Eldepryl, Marplan, Nardil, and Parnate -methylene blue (injected into a vein) -milnacipran -thioridazine -venlafaxine This medicine may also interact with the following medications: -alcohol -amphetamines -aspirin and  aspirin-like medicines -certain antibiotics like ciprofloxacin and enoxacin -certain medicines for blood pressure, heart disease, irregular heart beat -certain medicines for depression, anxiety, or psychotic disturbances -certain medicines for migraine headache like almotriptan, eletriptan, frovatriptan, naratriptan, rizatriptan, sumatriptan, zolmitriptan -certain medicines that treat or prevent blood clots like warfarin, enoxaparin, and dalteparin -cimetidine -fentanyl -lithium -NSAIDS, medicines for pain and inflammation, like ibuprofen or naproxen -phentermine -procarbazine -rasagiline -sibutramine -St. John's wort -theophylline -tramadol -tryptophan This list may not describe all possible interactions. Give your health care provider a list of all the medicines, herbs, non-prescription drugs, or dietary supplements you use. Also tell them if you smoke, drink alcohol, or use illegal drugs. Some items may interact with your medicine. What should I watch for while using this medicine? Tell your doctor if your symptoms do not get better or if they get worse. Visit your doctor or health care professional for regular checks on your progress. Because it may take several weeks to see the full effects of this medicine, it is important to continue your treatment as prescribed by your doctor. Patients and their families should watch out for new or worsening thoughts of suicide or depression. Also watch out for sudden changes in feelings such as feeling anxious, agitated, panicky, irritable, hostile, aggressive, impulsive, severely restless, overly excited and hyperactive, or not being able to sleep. If this happens, especially at the beginning of treatment or after a change in dose, call your health care professional. Alicia Davies may get drowsy or dizzy. Do not drive, use machinery, or do anything that needs mental alertness until you know how this medicine affects you. Do not stand or sit up quickly,  especially if you are an older patient. This reduces the risk of dizzy or fainting spells. Alcohol may interfere with the effect of this medicine. Avoid alcoholic drinks. This medicine can cause an increase in blood pressure. This medicine can also cause a sudden drop in your blood pressure, which may make you feel faint and increase the chance of a fall. These effects are most common when you first start the medicine or when the dose is increased, or during use of other medicines that can cause a sudden drop in blood pressure. Check with your doctor for instructions on monitoring your blood pressure while taking this medicine. Your mouth may get dry. Chewing sugarless gum or sucking hard candy, and drinking plenty of water may help. Contact your doctor if the problem does not go away or is severe. What side effects may I notice from receiving this medicine? Side effects that you should report to your doctor or health care professional as soon as possible: -allergic reactions like skin rash, itching or hives, swelling of the face, lips, or tongue -anxious -breathing problems -confusion -changes in vision -chest pain -confusion -elevated mood, decreased need for sleep, racing thoughts, impulsive behavior -eye pain -fast, irregular heartbeat -feeling faint or lightheaded, falls -feeling  agitated, angry, or irritable -hallucination, loss of contact with reality -high blood pressure -loss of balance or coordination -palpitations -redness, blistering, peeling or loosening of the skin, including inside the mouth -restlessness, pacing, inability to keep still -seizures -stiff muscles -suicidal thoughts or other mood changes -trouble passing urine or change in the amount of urine -trouble sleeping -unusual bleeding or bruising -unusually weak or tired -vomiting -yellowing of the eyes or skin Side effects that usually do not require medical attention (report to your doctor or health care  professional if they continue or are bothersome): -change in sex drive or performance -change in appetite or weight -constipation -dizziness -dry mouth -headache -increased sweating -nausea -tired This list may not describe all possible side effects. Call your doctor for medical advice about side effects. You may report side effects to FDA at 1-800-FDA-1088. Where should I keep my medicine? Keep out of the reach of children. Store at room temperature between 20 and 25 degrees C (68 to 77 degrees F). Throw away any unused medicine after the expiration date. NOTE: This sheet is a summary. It may not cover all possible information. If you have questions about this medicine, talk to your doctor, pharmacist, or health care provider.  2018 Elsevier/Gold Standard (2015-06-01 18:16:03)

## 2017-06-11 NOTE — Progress Notes (Signed)
Alicia Davies is a 62 y.o. female who presents to [facility]: Primary Care Sports Medicine today for follow-up back pain and discuss statin myalgia.  Alicia Davies was seen in November and December 2018 in February 2019 for chronic back pain.  She had pain response to facet injections that did not last.  She had medial branch blocks in preparation for nerve ablation in May.  She notes that around the same time she was getting these medial branch block she was dealing with statin myalgia and had worsening whole body pain.  She notes that immediately after the procedure she did have improved pain.  When the radiology group called her back to ask about pain response she was a bit confused and noted that she was not feeling much better.  She notes that she in fact did have greater than 50% improvement in her back pain following the medial branch block but not her whole body pain.  She is interested in proceeding with nerve ablation if possible.  Statin myalgia.  Patient was taking pravastatin to manage her CVD risk.  She notes that she was experiencing pain in her thighs and back with this issue.  Her cardiologist stopped the pravastatin and started Lipitor.  When she stopped the pravastatin before she started her Lipitor her pain significantly improved.  She has been taking the Lipitor now for a week and a half and notes that her thigh pain and back pain has worsened again.   ROS as above:  Exam:  BP (!) 153/81   Pulse (!) 51   Wt (!) 331 lb (150.1 kg)   BMI 48.88 kg/m  Gen: Well NAD HEENT: EOMI,  MMM Lungs: Normal work of breathing.  Abd:  Nondistended,  Exts: Brisk capillary refill, warm and well perfused.  L-spine: Tender to palpation.  Antalgic gait.   Lab and Radiology Results Lumbar spine MRI and medial branch block radiology reviewed.  Images reviewed  Assessment and Plan: 62 y.o. female with  Back  pain.  Patient has degenerative disc disease on her L-spine as well as facet DJD.  She had greater than 50% pain response following medial branch block.  I do believe she is a reasonable candidate for medial branch ablation and will reorder this procedure.  Statin myalgia: Intolerant to pravastatin and atorvastatin.  Discussed treatment options.  Will start Livalo.  If intolerant of that next step would be Repatha.  Coupon to [employer] provided.  Follow-up with PCP for this issue in the future.  Chronic pain: Discussed all the turn it is to pain control as well.  Consider Cymbalta in the future if not doing well.  We may switch away from Lexapro to Cymbalta.  Handout provided.  Recheck 1 month.   No orders of the defined types were placed in this encounter.  Meds ordered this encounter  Medications  . Pitavastatin Calcium (LIVALO) 2 MG TABS    Sig: Take 1 tablet (2 mg total) by mouth daily.    Dispense:  30 tablet    Refill:  1    Patient has tried and failed pravastatin, liptor.     Historical information moved to improve visibility of documentation.  Past Medical History:  Diagnosis Date  . Chronic pain 12/02/2016  . DDD (degenerative disc disease), lumbar 09/05/2016  . Depression 07/21/2013  . Essential hypertension, benign 07/21/2013  . Hyperlipidemia 07/21/2013  . Hyperparathyroidism   . Kidney stones   . Morbid obesity (HCC)  07/14/2015  . OSA (obstructive sleep apnea) 09/29/2013   Currently on CPAP therapy.  [facility].    Past Surgical History:  Procedure Laterality Date  . ABDOMINAL HYSTERECTOMY    . APPENDECTOMY    . CHOLECYSTECTOMY    . FOOT SURGERY    . KNEE ARTHROSCOPY     Right and lft.   [name] PARATHYROIDECTOMY  04/2010   done by Dr Gerrit Friends  . SHOULDER SURGERY     Social History   Tobacco Use  . Smoking status: Never Smoker  . Smokeless tobacco: Never Used  Substance Use Topics  . Alcohol use: No   family history includes Diabetes in her brother and mother;  Heart failure in her brother and mother.  Medications: Current Outpatient Medications  Medication Sig Dispense Refill  . furosemide (LASIX) 20 MG tablet Take 1 tablet (20 mg total) by mouth daily as needed. 30 tablet 0  . sertraline (ZOLOFT) 50 MG tablet Take 1 tablet (50 mg total) by mouth daily. 90 tablet 1  . XARELTO 20 MG TABS tablet TK 1 T PO D UTD  0  . metoprolol succinate (TOPROL-XL) 50 MG 24 hr tablet Take 50 mg by mouth daily.  4  . Pitavastatin Calcium (LIVALO) 2 MG TABS Take 1 tablet (2 mg total) by mouth daily. 30 tablet 1   No current facility-administered medications for this visit.    Allergies  Allergen Reactions  . Ciprofloxacin   . Fluconazole   . Metronidazole   . Naproxen Sodium     HIVES   . Lipitor [Atorvastatin Calcium] Other (See Comments)    Myalgia   . Pravastatin Other (See Comments)    Myalgia     Health Maintenance Health Maintenance  Topic Date Due  . Hepatitis C Screening  03-08-1955  . HIV Screening  01/04/1973  . PAP SMEAR  01/05/1979  . MAMMOGRAM  12/13/2016  . INFLUENZA VACCINE  08/14/2017  . TETANUS/TDAP  01/18/2020  . COLONOSCOPY  01/21/2020    Discussed warning signs or symptoms. Please see discharge instructions. Patient expresses understanding.

## 2017-06-20 ENCOUNTER — Other Ambulatory Visit: Payer: Self-pay | Admitting: Physician Assistant

## 2017-06-20 DIAGNOSIS — I10 Essential (primary) hypertension: Secondary | ICD-10-CM | POA: Diagnosis not present

## 2017-06-20 DIAGNOSIS — I4892 Unspecified atrial flutter: Secondary | ICD-10-CM | POA: Diagnosis not present

## 2017-06-20 DIAGNOSIS — R600 Localized edema: Secondary | ICD-10-CM

## 2017-06-20 DIAGNOSIS — E785 Hyperlipidemia, unspecified: Secondary | ICD-10-CM | POA: Diagnosis not present

## 2017-06-25 ENCOUNTER — Other Ambulatory Visit: Payer: Self-pay | Admitting: Physician Assistant

## 2017-06-25 DIAGNOSIS — R600 Localized edema: Secondary | ICD-10-CM

## 2017-06-26 ENCOUNTER — Ambulatory Visit
Admission: RE | Admit: 2017-06-26 | Discharge: 2017-06-26 | Disposition: A | Discharge status: Home / Self Care | Payer: BLUE CROSS/BLUE SHIELD | Source: Ambulatory Visit | Attending: Family Medicine | Admitting: Family Medicine

## 2017-06-26 ENCOUNTER — Other Ambulatory Visit: Payer: Self-pay | Admitting: Family Medicine

## 2017-06-26 DIAGNOSIS — M545 Low back pain: Secondary | ICD-10-CM | POA: Diagnosis not present

## 2017-06-26 DIAGNOSIS — M5136 Other intervertebral disc degeneration, lumbar region: Secondary | ICD-10-CM

## 2017-06-26 MED ORDER — SODIUM CHLORIDE 0.9 % IV SOLN
Freq: Once | INTRAVENOUS | Status: AC
  Administered 2017-06-26: 11:00:00 via INTRAVENOUS

## 2017-06-26 MED ORDER — MIDAZOLAM HCL 2 MG/2ML IJ SOLN
1.0000 mg | INTRAMUSCULAR | Status: DC | PRN
  Administered 2017-06-26: 2 mg via INTRAVENOUS

## 2017-06-26 MED ORDER — FENTANYL CITRATE (PF) 100 MCG/2ML IJ SOLN
25.0000 ug | INTRAMUSCULAR | Status: DC | PRN
  Administered 2017-06-26: 50 ug via INTRAVENOUS

## 2017-06-26 NOTE — Discharge Instructions (Signed)
Radio Frequency Ablation Post Procedure Discharge Instructions ° °1. May resume a regular diet and any medications that you routinely take (including pain medications). °2. No driving day of procedure. °3. Upon discharge go home and rest for at least 4 hours.  May use an ice pack as needed to injection sites on back. °4. Remove bandades later, today. ° ° ° °Please contact our office at [phone] for the following symptoms: ° °· Fever greater than 100 degrees °· Increased swelling, pain, or redness at injection site. ° ° °Thank you for visiting [facility]. ° °YOU MAY RESTART YOUR XARELTO TODAY. °

## 2017-06-26 NOTE — Progress Notes (Signed)
Pt tolerated procedure well. Pt currently alert, oriented and speaking in complete sentences. Pt denies pain at this time. Will continue to monitor in nurses station.

## 2017-07-09 ENCOUNTER — Encounter: Payer: Self-pay | Admitting: Family Medicine

## 2017-07-09 ENCOUNTER — Ambulatory Visit: Payer: BLUE CROSS/BLUE SHIELD | Admitting: Family Medicine

## 2017-07-09 VITALS — BP 113/64 | HR 52 | Ht 69.02 in | Wt 337.0 lb

## 2017-07-09 DIAGNOSIS — E782 Mixed hyperlipidemia: Secondary | ICD-10-CM

## 2017-07-09 DIAGNOSIS — M25551 Pain in right hip: Secondary | ICD-10-CM | POA: Diagnosis not present

## 2017-07-09 DIAGNOSIS — I10 Essential (primary) hypertension: Secondary | ICD-10-CM

## 2017-07-09 DIAGNOSIS — Z1231 Encounter for screening mammogram for malignant neoplasm of breast: Secondary | ICD-10-CM

## 2017-07-09 DIAGNOSIS — M791 Myalgia, unspecified site: Secondary | ICD-10-CM | POA: Diagnosis not present

## 2017-07-09 DIAGNOSIS — Z1239 Encounter for other screening for malignant neoplasm of breast: Secondary | ICD-10-CM

## 2017-07-09 DIAGNOSIS — T466X5A Adverse effect of antihyperlipidemic and antiarteriosclerotic drugs, initial encounter: Secondary | ICD-10-CM

## 2017-07-09 DIAGNOSIS — G894 Chronic pain syndrome: Secondary | ICD-10-CM | POA: Diagnosis not present

## 2017-07-09 DIAGNOSIS — Z114 Encounter for screening for human immunodeficiency virus [HIV]: Secondary | ICD-10-CM

## 2017-07-09 DIAGNOSIS — Z1159 Encounter for screening for other viral diseases: Secondary | ICD-10-CM

## 2017-07-09 MED ORDER — PITAVASTATIN CALCIUM 2 MG PO TABS: 2.0000 mg | ORAL_TABLET | Freq: Every day | ORAL | 1 refills | Status: DC

## 2017-07-09 NOTE — Patient Instructions (Addendum)
Thank you for coming in today. Get fasting labs soon.  Work on side leg raises and the stretching.  Plan to check back in about 6 weeks.  Return sooner if needed.  Recommend recheck with [facility] for a well check in the near future.   Return sooner if needed.   Trochanteric Bursitis Trochanteric bursitis is a condition that causes hip pain. Trochanteric bursitis happens when fluid-filled sacs (bursae) in the hip get irritated. Normally these sacs absorb shock and help strong bands of tissue (tendons) in your hip glide smoothly over each other and over your hip bones. What are the causes? This condition results from increased friction between the hip bones and the tendons that go over them. This condition can happen if you:  Have weak hips.  Use your hip muscles too much (overuse).  Get hit in the hip.  What increases the risk? This condition is more likely to develop in:  Women.  Adults who are middle-aged or older.  People with arthritis or a spinal condition.  People with weak buttocks muscles (gluteal muscles).  People who have one leg that is shorter than the other.  People who participate in certain kinds of athletic activities, such as: ? Running sports, especially long-distance running. ? Contact sports, like football or martial arts. ? Sports in which falls may occur, like skiing.  What are the signs or symptoms? The main symptom of this condition is pain and tenderness over the point of your hip. The pain may be:  Sharp and intense.  Dull and achy.  Felt on the outside of your thigh.  It may increase when you:  Lie on your side.  Walk or run.  Go up on stairs.  Sit.  Stand up after sitting.  Stand for long periods of time.  How is this diagnosed? This condition may be diagnosed based on:  Your symptoms.  Your medical history.  A physical exam.  Imaging tests, such as: ? X-rays to check your bones. ? An MRI or ultrasound to check your  tendons and muscles.  During your physical exam, your health care provider will check the movement and strength of your hip. He or she may press on the point of your hip to check for pain. How is this treated? This condition may be treated by:  Resting.  Reducing your activity.  Avoiding activities that cause pain.  Using crutches, a cane, or a walker to decrease the strain on your hip.  Taking medicine to help with swelling.  Having medicine injected into the bursae to help with swelling.  Using ice, heat, and massage therapy for pain relief.  Physical therapy exercises for strength and flexibility.  Surgery (rare).  Follow these instructions at home: Activity  Rest.  Avoid activities that cause pain.  Return to your normal activities as told by your health care provider. Ask your health care provider what activities are safe for you. Managing pain, stiffness, and swelling  Take over-the-counter and prescription medicines only as told by your health care provider.  If directed, apply heat to the injured area as told by your health care provider. ? Place a towel between your skin and the heat source. ? Leave the heat on for 20-30 minutes. ? Remove the heat if your skin turns bright red. This is especially important if you are unable to feel pain, heat, or cold. You may have a greater risk of getting burned.  If directed, apply ice to the injured area: ?  Put ice in a plastic bag. ? Place a towel between your skin and the bag. ? Leave the ice on for 20 minutes, 2-3 times a day. General instructions  If the affected leg is one that you use for driving, ask your health care provider when it is safe to drive.  Use crutches, a cane, or a walker as told by your health care provider.  If one of your legs is shorter than the other, get fitted for a shoe insert.  Lose weight if you are overweight. How is this prevented?  Wear supportive footwear that is appropriate for  your sport.  If you have hip pain, start any new exercise or sport slowly.  Maintain physical fitness, including: ? Strength. ? Flexibility. Contact a health care provider if:  Your pain does not improve with 2-4 weeks. Get help right away if:  You develop severe pain.  You have a fever.  You develop increased redness over your hip.  You have a change in your bowel function or bladder function.  You cannot control the muscles in your feet. This information is not intended to replace advice given to you by your health care provider. Make sure you discuss any questions you have with your health care provider. Document Released: 02/08/2004 Document Revised: 09/06/2015 Document Reviewed: 12/16/2014 Elsevier Interactive Patient Education  [employer]2018 Elsevier Inc[employer]

## 2017-07-09 NOTE — Progress Notes (Signed)
Alicia Davies is a 62 y.o. female who presents to [facility] today for follow-up back pain and statin myopathy and hyperlipidemia.  Alicia Davies is been seen several times most recently May 29 for back pain.  She had failed conservative management and in the interim has had facet medial branch ablation.  She notes significant improvement in pain.  She does continue to experience back pain but notes overall is much improved.  Additionally she notes that in the interim her Lipitor was discontinued and she was started on Livalo.  She notes that her overall body aches have significantly decreased off of Lipitor.  She thinks that she is tolerating the Livalo quite well with only minimal pain that she is not sure is coming from the statin or not.  Additionally Alicia Davies notes pain in her right hip.  She points to the lateral hip in the anterior hip.  She notes pain is worse with hip flexion and with standing from a seated position and laying on her right side.  She denies significant radiating pain weakness or numbness.  She denies any recent injury to her right hip.  No fevers or chills.   ROS as above:  Exam:  BP 113/64   Pulse (!) 52   Ht 5' 9.02" (1.753 m)   Wt (!) 337 lb (152.9 kg)   SpO2 97%   BMI 49.74 kg/m  Gen: Well NAD HEENT: EOMI,  MMM Lungs: Normal work of breathing. CTABL Heart: RRR no MRG Abd: NABS, Soft. Nondistended, Nontender Exts: Brisk capillary refill, warm and well perfused.  Right hip normal-appearing normal motion.  Pain with internal rotation and flexion. Tender palpation greater trochanter.  Hip abduction strength decreased 4/5.    Lab and Radiology Results CT scan images pelvis 2015 independently personally reviewed showing mild right hip DJD.    Assessment and Plan: 62 y.o. female with  Back pain: Considerable improvement following facet nerve  ablation.  Continue core strengthening exercises and recheck as needed.  Hip pain: Likely multifactorial.  Suspect trochanteric bursitis/hip abductor tendinopathy as the majority of her pain.  Additionally she does have evidence of hip impingement or DJD symptoms.  Plan for hip abductor strengthening and stretching program.  Recheck in 6 weeks if not better next it would be x-ray and potentially injection.  Hyperlipidemia/statin myopathy.  Considerable improvement off of Lipitor.  Tolerating Livalo well.  Plan to check basic fasting labs to follow-up lipids and metabolic panel.  Health maintenance: As were getting labs discussed with patient plan to obtain hepatitis C and HIV labs as part of routine screening.  Additionally will order mammogram.  Ideally patient will transition back to PCP for further primary care needs.  Recommend schedule wellness visit with Tandy Gaw PA in the near future.   Orders Placed This Encounter  Procedures  . MM 3D SCREEN BREAST BILATERAL    Standing Status:   Future    Standing Expiration Date:   09/09/2018    Order Specific Question:   Reason for Exam (SYMPTOM  OR DIAGNOSIS REQUIRED)    Answer:   screen breast cancer    Order Specific Question:   Is the patient pregnant?    Answer:   No    Order Specific Question:   Preferred imaging location?    Answer:   [facility]  . COMPLETE METABOLIC PANEL WITH GFR  . CBC  . Lipid Panel w/reflex Direct LDL  . Hepatitis  C antibody  . HIV antibody   Meds ordered this encounter  Medications  . Pitavastatin Calcium (LIVALO) 2 MG TABS    Sig: Take 1 tablet (2 mg total) by mouth daily.    Dispense:  90 tablet    Refill:  1    Patient has tried and failed pravastatin, liptor.     Historical information moved to improve visibility of documentation.  Past Medical History:  Diagnosis Date  . Chronic pain 12/02/2016  . DDD (degenerative disc disease), lumbar 09/05/2016  . Depression 07/21/2013  .  Essential hypertension, benign 07/21/2013  . Hyperlipidemia 07/21/2013  . Hyperparathyroidism   . Kidney stones   . Morbid obesity (HCC) 07/14/2015  . OSA (obstructive sleep apnea) 09/29/2013   Currently on CPAP therapy.  4[facility].    Past Surgical History:  Procedure Laterality Date  . ABDOMINAL HYSTERECTOMY    . APPENDECTOMY    . CHOLECYSTECTOMY    . FOOT SURGERY    . KNEE ARTHROSCOPY     Right and lft.   [name]. PARATHYROIDECTOMY  04/2010   done by Dr Gerrit FriendsGerkin  . SHOULDER SURGERY     Social History   Tobacco Use  . Smoking status: Never Smoker  . Smokeless tobacco: Never Used  Substance Use Topics  . Alcohol use: No   family history includes Diabetes in her brother and mother; Heart failure in her brother and mother.  Medications: Current Outpatient Medications  Medication Sig Dispense Refill  . furosemide (LASIX) 20 MG tablet TAKE 1 TABLET(20 MG) BY MOUTH DAILY AS NEEDED 30 tablet 0  . metoprolol succinate (TOPROL-XL) 50 MG 24 hr tablet Take 50 mg by mouth daily.  4  . Pitavastatin Calcium (LIVALO) 2 MG TABS Take 1 tablet (2 mg total) by mouth daily. 90 tablet 1  . sertraline (ZOLOFT) 50 MG tablet Take 1 tablet (50 mg total) by mouth daily. 90 tablet 1  . XARELTO 20 MG TABS tablet TK 1 T PO D UTD  0   No current facility-administered medications for this visit.    Allergies  Allergen Reactions  . Ciprofloxacin   . Fluconazole   . Metronidazole   . Naproxen Sodium     HIVES   . Lipitor [Atorvastatin Calcium] Other (See Comments)    Myalgia   . Pravastatin Other (See Comments)    Myalgia      Discussed warning signs or symptoms. Please see discharge instructions. Patient expresses understanding.

## 2017-07-29 ENCOUNTER — Other Ambulatory Visit: Payer: Self-pay | Admitting: Physician Assistant

## 2017-07-29 DIAGNOSIS — R600 Localized edema: Secondary | ICD-10-CM

## 2017-08-25 ENCOUNTER — Encounter: Payer: Self-pay | Admitting: Family Medicine

## 2017-08-25 ENCOUNTER — Ambulatory Visit: Payer: BLUE CROSS/BLUE SHIELD | Admitting: Family Medicine

## 2017-08-25 VITALS — BP 147/75 | HR 47 | Wt 340.0 lb

## 2017-08-25 DIAGNOSIS — M545 Low back pain, unspecified: Secondary | ICD-10-CM

## 2017-08-25 DIAGNOSIS — G894 Chronic pain syndrome: Secondary | ICD-10-CM

## 2017-08-25 DIAGNOSIS — M25551 Pain in right hip: Secondary | ICD-10-CM

## 2017-08-25 DIAGNOSIS — M791 Myalgia, unspecified site: Secondary | ICD-10-CM

## 2017-08-25 DIAGNOSIS — G8929 Other chronic pain: Secondary | ICD-10-CM

## 2017-08-25 DIAGNOSIS — E782 Mixed hyperlipidemia: Secondary | ICD-10-CM | POA: Diagnosis not present

## 2017-08-25 DIAGNOSIS — T466X5A Adverse effect of antihyperlipidemic and antiarteriosclerotic drugs, initial encounter: Secondary | ICD-10-CM

## 2017-08-25 NOTE — Progress Notes (Signed)
Alicia Davies is a 62 y.o. female who [facility] today for follow-up back pain, hip pain, and statin myalgia.  Alicia Davies has been seen multiple times in 2019 for back and hip pain.  Her back pain was improved following lumbar facet radiofrequency ablation in June.  She notes her back pain is still present but much improved.  She is quite happy with how things are going.  Hip pain: Alicia Davies was seen most recently in late June for her hip pain.  She had pain in the lateral and anterior hip thought to be related to trochanteric bursitis as well as some acetabular impingement.  She was prescribed home exercise program and has been intermittently doing her hip abductor stretching and strengthening exercises.  She notes this is not helped and she is about the same as she was previously.  She has pain in the lateral hip when she lays on her right side stand from a seated position with prolonged standing.  She does also note some anterior hip pain especially when getting in and out of the car.  Denies any significant radiating pain or numbness.  Additionally she notes muscle pain.  She had trials of pravastatin as well as atorvastatin and had intolerance due to muscle pains and aches.  She was tried on Livalo at the last check and notes that on this medication her muscle cramping and aching returned.  She stopped the Livalo the cramping and pain improved and when she restarted again her pain worsened again.  She is still taking Livalo.  Her CVD risk is due to hyperlipidemia as well as hypertension and atrial flutter with RVR history.   ROS as above:  Exam:  BP (!) 147/75   Pulse (!) 47   Wt (!) 340 lb (154.2 kg)   BMI 50.19 kg/m  Gen: Well NAD HEENT: EOMI,  MMM Lungs: Normal work of breathing. CTABL Heart: RRR no MRG Abd: NABS, Soft. Nondistended, Nontender Exts: Brisk  capillary refill, warm and well perfused.  MSK: Right hip normal-appearing tender to palpation greater trochanter.  Hip abduction strength diminished 4/5.  Range of motion reduced to rotation and flexion.   Lab and Radiology Results EXAM: DG HIP (WITH OR WITHOUT PELVIS) 2-3V LEFT  COMPARISON:  None.  FINDINGS: No evidence of acute fracture or dislocation. Well-preserved joint space. Well-preserved bone mineral density. No intrinsic osseous abnormality.  Included AP pelvis demonstrates a symmetric normal-appearing contralateral RIGHT hip joint. Sacroiliac joints intact. Symphysis pubis intact with degenerative changes.  IMPRESSION: 1. Normal LEFT hip. 2. Degenerative changes involving the symphysis pubis.   Electronically Signed   By: Alicia Davies M.D.   On: 04/19/2017 16:16 I personally (independently) visualized and performed the interpretation of the images attached in this note.   Hip greater trochanteric injection: right greater trochanter Consent obtained and timeout performed. Area of maximum tenderness palpated and identified. Skin cleaned with alcohol, cold spray applied. A spinal needle was used to access the greater trochanteric bursa. 80 mg of Depo-Medrol, and 3 mL of Marcaine were used to inject the trochanteric bursa. Patient tolerated the procedure well.    Assessment and Plan: 62 y.o. female with  Back pain: Improved following radio frequency ablation.  Plan for watchful waiting with continued core strengthening.  Hip pain: Partially due to trochanteric bursitis.  Plan for greater trochanter injection and continued home exercise program.  Recheck in 6 weeks.  If not improving  consider diagnostic and therapeutic femoral acetabular injection.  Statin myalgia: Intolerant of statins.  Plan to recheck lipids.  If CV risk high enough to consider Repatha.  Defer this decision to PCP.    Historical information moved to improve visibility of  documentation.  Past Medical History:  Diagnosis Date  . Chronic pain 12/02/2016  . DDD (degenerative disc disease), lumbar 09/05/2016  . Depression 07/21/2013  . Essential hypertension, benign 07/21/2013  . Hyperlipidemia 07/21/2013  . Hyperparathyroidism   . Kidney stones   . Morbid obesity (HCC) 07/14/2015  . OSA (obstructive sleep apnea) 09/29/2013   Currently on CPAP therapy.  4[facility].    Past Surgical History:  Procedure Laterality Date  . ABDOMINAL HYSTERECTOMY    . APPENDECTOMY    . CHOLECYSTECTOMY    . FOOT SURGERY    . KNEE ARTHROSCOPY     Right and lft.   [name]. PARATHYROIDECTOMY  04/2010   done by Dr Alicia Davies  . SHOULDER SURGERY     Social History   Tobacco Use  . Smoking status: Never Smoker  . Smokeless tobacco: Never Used  Substance Use Topics  . Alcohol use: No   family history includes Diabetes in her brother and mother; Heart failure in her brother and mother.  Medications: Current Outpatient Medications  Medication Sig Dispense Refill  . furosemide (LASIX) 20 MG tablet TAKE 1 TABLET(20 MG) BY MOUTH DAILY AS NEEDED 30 tablet 0  . metoprolol succinate (TOPROL-XL) 50 MG 24 hr tablet Take 50 mg by mouth daily.  4  . sertraline (ZOLOFT) 50 MG tablet Take 1 tablet (50 mg total) by mouth daily. 90 tablet 1  . XARELTO 20 MG TABS tablet TK 1 T PO D UTD  0   No current facility-administered medications for this visit.    Allergies  Allergen Reactions  . Livalo [Pitavastatin] Other (See Comments)    Myalgia  . Ciprofloxacin   . Fluconazole   . Metronidazole   . Naproxen Sodium     HIVES   . Lipitor [Atorvastatin Calcium] Other (See Comments)    Myalgia   . Pravastatin Other (See Comments)    Myalgia      Discussed warning signs or symptoms. Please see discharge instructions. Patient expresses understanding.

## 2017-08-25 NOTE — Patient Instructions (Addendum)
Thank you for coming in today. STOP Livalo.  If you still need cholesterol lowering we can use Reaptha.   Call or go to the ER if you develop a large red swollen joint with extreme pain or oozing puss.  Work on hip exercises.  Recheck in 6 weeks.     Evolocumab injection What is this medicine? EVOLOCUMAB (e voe LOK ue mab) is known as a PCSK9 inhibitor. It is used to lower the level of cholesterol in the blood. It may be used alone or in combination with other cholesterol-lowering drugs. This drug may also be used to reduce the risk of heart attack, stroke, and certain types of heart surgery in patients with heart disease. This medicine may be used for other purposes; ask your health care provider or pharmacist if you have questions. COMMON BRAND NAME(S): REPATHA What should I tell my health care provider before I take this medicine? They need to know if you have any of these conditions: -an unusual or allergic reaction to evolocumab, other medicines, foods, dyes, or preservatives -pregnant or trying to get pregnant -breast-feeding How should I use this medicine? This medicine is for injection under the skin. You will be taught how to prepare and give this medicine. Use exactly as directed. Take your medicine at regular intervals. Do not take your medicine more often than directed. It is important that you put your used needles and syringes in a special sharps container. Do not put them in a trash can. If you do not have a sharps container, call your pharmacist or health care provider to get one. Talk to your pediatrician regarding the use of this medicine in children. While this drug may be prescribed for children as young as 13 years for selected conditions, precautions do apply. Overdosage: If you think you have taken too much of this medicine contact a poison control center or emergency room at once. NOTE: This medicine is only for you. Do not share this medicine with others. What if I  miss a dose? If you miss a dose, take it as soon as you can if there are more than 7 days until the next scheduled dose, or skip the missed dose and take the next dose according to your original schedule. Do not take double or extra doses. What may interact with this medicine? Interactions are not expected. This list may not describe all possible interactions. Give your health care provider a list of all the medicines, herbs, non-prescription drugs, or dietary supplements you use. Also tell them if you smoke, drink alcohol, or use illegal drugs. Some items may interact with your medicine. What should I watch for while using this medicine? You may need blood work while you are taking this medicine. What side effects may I notice from receiving this medicine? Side effects that you should report to your doctor or health care professional as soon as possible: -allergic reactions like skin rash, itching or hives, swelling of the face, lips, or tongue -signs and symptoms of infection like fever or chills; cough; sore throat; pain or trouble passing urine Side effects that usually do not require medical attention (report to your doctor or health care professional if they continue or are bothersome): -diarrhea -nausea -muscle pain -pain, redness, or irritation at site where injected This list may not describe all possible side effects. Call your doctor for medical advice about side effects. You may report side effects to FDA at 1-800-FDA-1088. Where should I keep my medicine? Keep out  of the reach of children. You will be instructed on how to store this medicine. Throw away any unused medicine after the expiration date on the label. NOTE: This sheet is a summary. It may not cover all possible information. If you have questions about this medicine, talk to your doctor, pharmacist, or health care provider.  2018 Elsevier/Gold Standard (2015-12-18 13:21:53)

## 2017-08-26 LAB — HEPATITIS C ANTIBODY
Hepatitis C Ab: NONREACTIVE
SIGNAL TO CUT-OFF: 0.02 (ref <1.00)

## 2017-08-26 LAB — CBC
HCT: 40.3 % (ref 35.0–45.0)
Hemoglobin: 13.2 g/dL (ref 11.7–15.5)
MCH: 30.1 pg (ref 27.0–33.0)
MCHC: 32.8 g/dL (ref 32.0–36.0)
MCV: 92 fL (ref 80.0–100.0)
MPV: 13.2 fL — ABNORMAL HIGH (ref 7.5–12.5)
PLATELETS: 219 10*3/uL (ref 140–400)
RBC: 4.38 10*6/uL (ref 3.80–5.10)
RDW: 13.2 % (ref 11.0–15.0)
WBC: 8.3 10*3/uL (ref 3.8–10.8)

## 2017-08-26 LAB — COMPLETE METABOLIC PANEL WITH GFR
AG RATIO: 1.5 (calc) (ref 1.0–2.5)
ALBUMIN MSPROF: 4 g/dL (ref 3.6–5.1)
ALT: 11 U/L (ref 6–29)
AST: 13 U/L (ref 10–35)
Alkaline phosphatase (APISO): 78 U/L (ref 33–130)
BILIRUBIN TOTAL: 0.5 mg/dL (ref 0.2–1.2)
BUN / CREAT RATIO: 20 (calc) (ref 6–22)
BUN: 24 mg/dL (ref 7–25)
CALCIUM: 9.4 mg/dL (ref 8.6–10.4)
CHLORIDE: 107 mmol/L (ref 98–110)
CO2: 25 mmol/L (ref 20–32)
Creat: 1.21 mg/dL — ABNORMAL HIGH (ref 0.50–1.05)
GFR, EST AFRICAN AMERICAN: 57 mL/min/{1.73_m2} — AB (ref >=60)
GFR, EST NON AFRICAN AMERICAN: 49 mL/min/{1.73_m2} — AB (ref >=60)
GLOBULIN: 2.7 g/dL (ref 1.9–3.7)
Glucose, Bld: 130 mg/dL — ABNORMAL HIGH (ref 65–99)
POTASSIUM: 4.6 mmol/L (ref 3.5–5.3)
Sodium: 140 mmol/L (ref 135–146)
TOTAL PROTEIN: 6.7 g/dL (ref 6.1–8.1)

## 2017-08-26 LAB — LIPID PANEL W/REFLEX DIRECT LDL
Cholesterol: 152 mg/dL (ref <200)
HDL: 34 mg/dL — ABNORMAL LOW (ref >50)
LDL Cholesterol (Calc): 91 mg/dL (calc)
NON-HDL CHOLESTEROL (CALC): 118 mg/dL (ref <130)
Total CHOL/HDL Ratio: 4.5 (calc) (ref <5.0)
Triglycerides: 172 mg/dL — ABNORMAL HIGH (ref <150)

## 2017-08-26 LAB — HIV ANTIBODY (ROUTINE TESTING W REFLEX): HIV 1&2 Ab, 4th Generation: NONREACTIVE

## 2017-08-28 ENCOUNTER — Other Ambulatory Visit: Payer: Self-pay | Admitting: Physician Assistant

## 2017-08-28 DIAGNOSIS — R600 Localized edema: Secondary | ICD-10-CM

## 2017-10-01 ENCOUNTER — Other Ambulatory Visit: Payer: Self-pay | Admitting: Physician Assistant

## 2017-10-01 DIAGNOSIS — R600 Localized edema: Secondary | ICD-10-CM

## 2017-10-06 ENCOUNTER — Encounter: Payer: Self-pay | Admitting: Family Medicine

## 2017-10-06 ENCOUNTER — Ambulatory Visit: Payer: BLUE CROSS/BLUE SHIELD | Admitting: Family Medicine

## 2017-10-06 VITALS — BP 144/55 | HR 47 | Wt 344.0 lb

## 2017-10-06 DIAGNOSIS — M545 Low back pain, unspecified: Secondary | ICD-10-CM

## 2017-10-06 DIAGNOSIS — Z23 Encounter for immunization: Secondary | ICD-10-CM

## 2017-10-06 DIAGNOSIS — M25551 Pain in right hip: Secondary | ICD-10-CM | POA: Diagnosis not present

## 2017-10-06 DIAGNOSIS — G8929 Other chronic pain: Secondary | ICD-10-CM

## 2017-10-06 NOTE — Patient Instructions (Addendum)
Thank you for coming in today. Attend PT.  Recheck in 6 weeks.  OK to use TENS unit.    Trochanteric Bursitis Rehab Ask your health care provider which exercises are safe for you. Do exercises exactly as told by your health care provider and adjust them as directed. It is normal to feel mild stretching, pulling, tightness, or discomfort as you do these exercises, but you should stop right away if you feel sudden pain or your pain gets worse.Do not begin these exercises until told by your health care provider. Stretching exercises These exercises warm up your muscles and joints and improve the movement and flexibility of your hip. These exercises also help to relieve pain and stiffness. Exercise A: Iliotibial band stretch  1. Lie on your side with your left / right leg in the top position. 2. Bend your left / right knee and grab your ankle. 3. Slowly bring your knee back so your thigh is behind your body. 4. Slowly lower your knee toward the floor until you feel a gentle stretch on the outside of your left / right thigh. If you do not feel a stretch and your knee will not fall farther, place the heel of your other foot on top of your outer knee and pull your thigh down farther. 5. Hold this position for __________ seconds. 6. Slowly return to the starting position. Repeat __________ times. Complete this exercise __________ times a day. Strengthening exercises These exercises build strength and endurance in your hip and pelvis. Endurance is the ability to use your muscles for a long time, even after they get tired. Exercise B: Bridge ( hip extensors) 1. Lie on your back on a firm surface with your knees bent and your feet flat on the floor. 2. Tighten your buttocks muscles and lift your buttocks off the floor until your trunk is level with your thighs. You should feel the muscles working in your buttocks and the back of your thighs. If this exercise is too easy, try doing it with your arms  crossed over your chest. 3. Hold this position for __________ seconds. 4. Slowly return to the starting position. 5. Let your muscles relax completely between repetitions. Repeat __________ times. Complete this exercise __________ times a day. Exercise C: Squats ( knee extensors and  quadriceps) 1. Stand in front of a table, with your feet and knees pointing straight ahead. You may rest your hands on the table for balance but not for support. 2. Slowly bend your knees and lower your hips like you are going to sit in a chair. ? Keep your weight over your heels, not over your toes. ? Keep your lower legs upright so they are parallel with the table legs. ? Do not let your hips go lower than your knees. ? Do not bend lower than told by your health care provider. ? If your hip pain increases, do not bend as low. 3. Hold this position for __________ seconds. 4. Slowly push with your legs to return to standing. Do not use your hands to pull yourself to standing. Repeat __________ times. Complete this exercise __________ times a day. Exercise D: Hip hike 1. Stand sideways on a bottom step. Stand on your left / right leg with your other foot unsupported next to the step. You can hold onto the railing or wall if needed for balance. 2. Keeping your knees straight and your torso square, lift your left / right hip up toward the ceiling. 3. Hold this  position for __________ seconds. 4. Slowly let your left / right hip lower toward the floor, past the starting position. Your foot should get closer to the floor. Do not lean or bend your knees. Repeat __________ times. Complete this exercise __________ times a day. Exercise E: Single leg stand 1. Stand near a counter or door frame that you can hold onto for balance as needed. It is helpful to stand in front of a mirror for this exercise so you can watch your hip. 2. Squeeze your left / right buttock muscles then lift up your other foot. Do not let your left  / right hip push out to the side. 3. Hold this position for __________ seconds. Repeat __________ times. Complete this exercise __________ times a day. This information is not intended to replace advice given to you by your health care provider. Make sure you discuss any questions you have with your health care provider. Document Released: 02/08/2004 Document Revised: 09/07/2015 Document Reviewed: 12/16/2014 Elsevier Interactive Patient Education  [employer].

## 2017-10-06 NOTE — Progress Notes (Signed)
Alicia Davies is a 62 y.o. female who presents to [facility] today for right lateral hip pain.  Alicia Davies notes continued right lateral hip pain.  She is had pain previously and was seen and March May and June for back and hip pain.  She was thought to have some component of trochanteric bursitis.  She is been trying home exercise program which has been helpful but she continues to experience pain in the lateral hip.  Pain is worse with standing from seated position when laying on her right side.  She denies significant radiating pain.  Additionally she notes recurring low back pain.  She was diagnosed with chronic low back pain and had significant benefit following medial branch nerve ablation of her bilateral L4-5 and L5-S1.  This was done in June.  She notes the pain is starting to creep back but is still significant improved where it was preprocedure.  Notes some pain in the low back worse with activity better with rest.    ROS:  As above  Exam:  BP (!) 144/55   Pulse (!) 47   Wt (!) 344 lb (156 kg)   BMI 50.78 kg/m  General: Well Developed, well nourished, and in no acute distress.  Neuro/Psych: Alert and oriented x3, extra-ocular muscles intact, able to move all 4 extremities, sensation grossly intact. Skin: Warm and dry, no rashes noted.  Respiratory: Not using accessory muscles, speaking in full sentences, trachea midline.  Cardiovascular: Pulses palpable, no extremity edema. Abdomen: Does not appear distended. MSK:  Right hip normal-appearing normal motion. Tender palpation greater trochanter. Hip abduction strength diminished 4/5 Left hip normal-appearing nontender normal motion normal strength. L-spine: Nontender to midline decreased lumbar motion.     Assessment and Plan: 62 y.o. female with  Right lateral hip pain: Trochanteric bursitis.  Plan for dedicated formal physical therapy.  Not improving next step would be injection.   Recheck in about 6 weeks.  Lumbar spine: Doing well however slight recurrence.  Recheck 6 weeks.  At that point if the pain is still returning we will go ahead and refer back and reorder lumbar facet blocks and ablation as this has worked well in the past and it will be about 6 months at that time.   Flu vaccine given today prior to discharge.   Orders Placed This Encounter  Procedures  . Flu Vaccine QUAD 36+ mos IM  . Ambulatory referral to Physical Therapy    Referral Priority:   Routine    Referral Type:   Physical Medicine    Referral Reason:   Specialty Services Required    Requested Specialty:   Physical Therapy   No orders of the defined types were placed in this encounter.   Historical information moved to improve visibility of documentation.  Past Medical History:  Diagnosis Date  . Chronic pain 12/02/2016  . DDD (degenerative disc disease), lumbar 09/05/2016  . Depression 07/21/2013  . Essential hypertension, benign 07/21/2013  . Hyperlipidemia 07/21/2013  . Hyperparathyroidism   . Kidney stones   . Morbid obesity (HCC) 07/14/2015  . OSA (obstructive sleep apnea) 09/29/2013   Currently on CPAP therapy.  [facility].    Past Surgical History:  Procedure Laterality Date  . ABDOMINAL HYSTERECTOMY    . APPENDECTOMY    . CHOLECYSTECTOMY    . FOOT SURGERY    . KNEE ARTHROSCOPY     Right and lft.   [name] PARATHYROIDECTOMY  04/2010   done  by Dr Gerrit FriendsGerkin  . SHOULDER SURGERY     Social History   Tobacco Use  . Smoking status: Never Smoker  . Smokeless tobacco: Never Used  Substance Use Topics  . Alcohol use: No   family history includes Diabetes in her brother and mother; Heart failure in her brother and mother.  Medications: Current Outpatient Medications  Medication Sig Dispense Refill  . furosemide (LASIX) 20 MG tablet TAKE 1 TABLET(20 MG) BY MOUTH DAILY AS NEEDED 30 tablet 0  . metoprolol succinate (TOPROL-XL) 50 MG 24 hr tablet Take 50 mg by mouth daily.  4  .  sertraline (ZOLOFT) 50 MG tablet Take 1 tablet (50 mg total) by mouth daily. 90 tablet 1  . XARELTO 20 MG TABS tablet TK 1 T PO D UTD  0   No current facility-administered medications for this visit.    Allergies  Allergen Reactions  . Livalo [Pitavastatin] Other (See Comments)    Myalgia  . Ciprofloxacin   . Fluconazole   . Metronidazole   . Naproxen Sodium     HIVES   . Lipitor [Atorvastatin Calcium] Other (See Comments)    Myalgia   . Pravastatin Other (See Comments)    Myalgia       Discussed warning signs or symptoms. Please see discharge instructions. Patient expresses understanding.

## 2017-10-21 ENCOUNTER — Ambulatory Visit: Payer: BLUE CROSS/BLUE SHIELD | Admitting: Physical Therapy

## 2017-10-24 ENCOUNTER — Ambulatory Visit: Payer: BLUE CROSS/BLUE SHIELD | Admitting: Physician Assistant

## 2017-10-24 ENCOUNTER — Encounter: Payer: Self-pay | Admitting: Physician Assistant

## 2017-10-24 VITALS — BP 121/74 | HR 44 | Temp 98.4°F | Ht 69.02 in | Wt 344.0 lb

## 2017-10-24 DIAGNOSIS — R197 Diarrhea, unspecified: Secondary | ICD-10-CM

## 2017-10-24 MED ORDER — RIFAXIMIN 550 MG PO TABS
550.0000 mg | ORAL_TABLET | Freq: Three times a day (TID) | ORAL | 0 refills | Status: DC
Start: 2017-10-24 — End: 2018-05-29

## 2017-10-24 NOTE — Progress Notes (Signed)
   Subjective:    Patient ID: Alicia Davies, female    DOB: April 11, 1955, 62 y.o.   MRN: [id number]  HPI  Pt is a 62 yo obese female with HTN, chronic back pain, CKD who presents to the clinic to discuss diarrhea. She has a hx diarrhea but for the last 2 months has been 3-5 times a day. She denies any fever, chills, abdominal pain, flank pain, nausea, body aches. She denies any travel or eaten new foods. She denies any melena or hematochezia. She is having a lot of urgency. She has had a few accidents. She has more episodes after eating. Last colonoscopy 2012.   .. Active Ambulatory Problems    Diagnosis Date Noted  . B12 DEFICIENCY 12/11/2009  . UNSPECIFIED VITAMIN D DEFICIENCY 12/05/2009  . HYPERCALCEMIA 12/06/2009  . MENOPAUSAL SYNDROME 12/05/2009  . FATIGUE 12/05/2009  . Primary hyperparathyroidism (HCC) 01/29/2010  . POSTMENOPAUSAL STATUS 01/17/2010  . Benign cyst of left breast 10/28/2012  . Depression 07/21/2013  . Essential hypertension, benign 07/21/2013  . Hyperlipidemia 07/21/2013  . OSA (obstructive sleep apnea) 09/29/2013  . S/P right knee arthroscopy 02/28/2014  . Hyperglycemia 06/10/2014  . Seborrheic keratoses 07/14/2015  . Morbid obesity (HCC) 07/14/2015  . CKD (chronic kidney disease) stage 3, GFR 30-59 ml/min (HCC) 10/15/2015  . DDD (degenerative disc disease), lumbar 09/05/2016  . Chronic pain 12/02/2016  . Atrial flutter with rapid ventricular response (HCC) 04/07/2017  . Myalgia due to statin 06/11/2017   Resolved Ambulatory Problems    Diagnosis Date Noted  . Obesity, unspecified 12/05/2009  . Right knee pain 08/24/2010  . Severe obstructive sleep apnea 10/12/2012  . Severe obesity (BMI >= 40) (HCC) 07/21/2013  . Swelling of lower extremity 07/21/2013  . Muscle spasm 03/22/2014  . Piriformis syndrome of left side 07/14/2015  . Viral upper respiratory infection 01/26/2016   Past Medical History:  Diagnosis Date  . Hyperparathyroidism   . Kidney stones       Review of Systems See HPI.     Objective:   Physical Exam  Constitutional: She is oriented to person, place, and time. She appears well-developed and well-nourished.  HENT:  Head: Normocephalic and atraumatic.  Cardiovascular: Normal rate and regular rhythm.  Pulmonary/Chest: Effort normal.  Abdominal: Soft. Bowel sounds are normal. There is no tenderness.  Neurological: She is alert and oriented to person, place, and time.  Psychiatric: She has a normal mood and affect. Her behavior is normal.          Assessment & Plan:  Marland KitchenMarland KitchenDiagnoses and all orders for this visit:  Diarrhea, unspecified type -     rifaximin (XIFAXAN) 550 MG TABS tablet; Take 1 tablet (550 mg total) by mouth 3 (three) times daily. For 14 days.  no red flag signs today.  Symptoms sound consistent with IBS diarrhea. Pt has been on a lot of antibiotics recently. I would like for her to try xifaxan to reset gut bacteria content. If not affordable then would try bentyl and levbid with probiotic. Follow up as needed.

## 2017-10-27 ENCOUNTER — Other Ambulatory Visit: Payer: Self-pay | Admitting: Physician Assistant

## 2017-10-27 ENCOUNTER — Encounter: Payer: Self-pay | Admitting: Physician Assistant

## 2017-10-27 DIAGNOSIS — F3342 Major depressive disorder, recurrent, in full remission: Secondary | ICD-10-CM

## 2017-11-17 ENCOUNTER — Ambulatory Visit: Payer: BLUE CROSS/BLUE SHIELD | Admitting: Family Medicine

## 2017-11-17 ENCOUNTER — Encounter: Payer: Self-pay | Admitting: Family Medicine

## 2017-11-17 VITALS — BP 147/83 | HR 50 | Wt 345.0 lb

## 2017-11-17 DIAGNOSIS — M545 Low back pain, unspecified: Secondary | ICD-10-CM

## 2017-11-17 DIAGNOSIS — G894 Chronic pain syndrome: Secondary | ICD-10-CM

## 2017-11-17 DIAGNOSIS — M47816 Spondylosis without myelopathy or radiculopathy, lumbar region: Secondary | ICD-10-CM

## 2017-11-17 DIAGNOSIS — M7061 Trochanteric bursitis, right hip: Secondary | ICD-10-CM | POA: Diagnosis not present

## 2017-11-17 DIAGNOSIS — G8929 Other chronic pain: Secondary | ICD-10-CM

## 2017-11-17 NOTE — Progress Notes (Signed)
Alicia Davies is a 62 y.o. female who presents to [facility] today for follow-up back pain and hip pain.  Alicia Davies returns to clinic today complaining of right hip pain.  Pain located in the right lateral hip thought to be trochanteric bursitis.  She is tried home exercise program which has not worked.  She was unable to complete formal physical therapy as it is quite expensive for her.  Pain is worse when she lays on her side at night and when she stands from a seated position.  She is ready to proceed with injection.  Additionally she notes returning chronic low back pain.  She had episodes of pain previously and did very well with lumbar radiofrequency ablation to her left facet joints.  She has this worked until recently and she like to proceed with repeat injection and ablation if possible.     ROS:  As above  Exam:  BP (!) 147/83   Pulse (!) 50   Wt (!) 345 lb (156.5 kg)   BMI 50.92 kg/m  General: Well Developed, well nourished, and in no acute distress.  Neuro/Psych: Alert and oriented x3, extra-ocular muscles intact, able to move all 4 extremities, sensation grossly intact. Skin: Warm and dry, no rashes noted.  Respiratory: Not using accessory muscles, speaking in full sentences, trachea midline.  Cardiovascular: Pulses palpable, no extremity edema. Abdomen: Does not appear distended. MSK:  L-spine nontender decreased motion antalgic gait. Right hip normal-appearing tender palpation trochanteric bursa.  Decreased hip abduction strength 4/5.  Antalgic gait.  Greater trochanteric bursa injection: right Consent obtained and timeout performed.  Patient laying on side with affected hip up.   Area located and marked.   Skin cleaned with rubbing alcohol and chlorhexidine, and cold spray applied Using a spinal needle the greater trochanteric bursa was accessed.   80 mg of depomedrol and 4 mL of Marcaine were injected in a wheel pattern.     Immediate improvement following injection:  Patient tolerated procedure well with no weakness or numbness or bleeding.       Assessment and Plan: 62 y.o. female with  Right lateral hip pain due to trochanteric bursitis.  Plan for injection and home exercise program.  Hopeful that once back gets a bit better controlled hip will also follow along.  Recheck in a few weeks if not improving.  Low back pain: Return of chronic low back pain following medial branch ablation about 6 months ago.  Plan to repeat ablation and recheck as needed.   Orders Placed This Encounter  Procedures  . DG Facet Jt Neuro Destruct Sing L/S w/Img Guide    Order Specific Question:   Reason for exam:    Answer:   radiofrequency ablation of: LEFT L4-5 and LEFT L5-S1 facets    Order Specific Question:   Is the patient pregnant?    Answer:   No    Order Specific Question:   Preferred imaging location?    Answer:   [facility]  . DG Facet Jt Neuro Destruct Ea Add C/T w/Img Guide    Standing Status:   Future    Standing Expiration Date:   01/18/2019    Order Specific Question:   Reason for Exam (SYMPTOM  OR DIAGNOSIS REQUIRED)    Answer:   Left L4-L5 and L5-S1    Order Specific Question:   Is the patient pregnant?    Answer:   No    Order Specific Question:  Preferred Imaging Location?    Answer:   [facility]    Order Specific Question:   Radiology Contrast Protocol - do NOT remove file path    Answer:   [email or web address]   No orders of the defined types were placed in this encounter.   Historical information moved to improve visibility of documentation.  Past Medical History:  Diagnosis Date  . Chronic pain 12/02/2016  . DDD (degenerative disc disease), lumbar 09/05/2016  . Depression 07/21/2013  . Essential hypertension, benign 07/21/2013  . Hyperlipidemia 07/21/2013  . Hyperparathyroidism   . Kidney stones   . Morbid obesity (HCC) 07/14/2015  . OSA  (obstructive sleep apnea) 09/29/2013   Currently on CPAP therapy.  [facility].    Past Surgical History:  Procedure Laterality Date  . ABDOMINAL HYSTERECTOMY    . APPENDECTOMY    . CHOLECYSTECTOMY    . FOOT SURGERY    . KNEE ARTHROSCOPY     Right and lft.   [name] PARATHYROIDECTOMY  04/2010   done by Dr Gerrit Friends  . SHOULDER SURGERY     Social History   Tobacco Use  . Smoking status: Never Smoker  . Smokeless tobacco: Never Used  Substance Use Topics  . Alcohol use: No   family history includes Diabetes in her brother and mother; Heart failure in her brother and mother.  Medications: Current Outpatient Medications  Medication Sig Dispense Refill  . furosemide (LASIX) 20 MG tablet TAKE 1 TABLET(20 MG) BY MOUTH DAILY AS NEEDED 30 tablet 0  . metoprolol succinate (TOPROL-XL) 50 MG 24 hr tablet Take 50 mg by mouth daily.  4  . rifaximin (XIFAXAN) 550 MG TABS tablet Take 1 tablet (550 mg total) by mouth 3 (three) times daily. For 14 days. 42 tablet 0  . sertraline (ZOLOFT) 50 MG tablet TAKE 1 TABLET(50 MG) BY MOUTH DAILY 90 tablet 0  . XARELTO 20 MG TABS tablet TK 1 T PO D UTD  0   No current facility-administered medications for this visit.    Allergies  Allergen Reactions  . Livalo [Pitavastatin] Other (See Comments)    Myalgia  . Ciprofloxacin   . Fluconazole   . Metronidazole   . Naproxen Sodium     HIVES   . Lipitor [Atorvastatin Calcium] Other (See Comments)    Myalgia   . Pravastatin Other (See Comments)    Myalgia       Discussed warning signs or symptoms. Please see discharge instructions. Patient expresses understanding.

## 2017-11-17 NOTE — Patient Instructions (Signed)
Thank you for coming in today. Call or go to the ER if you develop a large red swollen joint with extreme pain or oozing puss.   You should hear about the back injection soon.  Let me know if you are not doing well.  Call or go to the ER if you develop a large red swollen joint with extreme pain or oozing puss.

## 2017-11-18 ENCOUNTER — Other Ambulatory Visit: Payer: Self-pay | Admitting: Family Medicine

## 2017-11-18 DIAGNOSIS — G8929 Other chronic pain: Secondary | ICD-10-CM

## 2017-11-18 DIAGNOSIS — G894 Chronic pain syndrome: Secondary | ICD-10-CM

## 2017-11-18 DIAGNOSIS — M545 Low back pain, unspecified: Secondary | ICD-10-CM

## 2017-11-18 DIAGNOSIS — M47816 Spondylosis without myelopathy or radiculopathy, lumbar region: Secondary | ICD-10-CM

## 2017-11-25 NOTE — Discharge Instructions (Signed)

## 2017-11-26 ENCOUNTER — Ambulatory Visit
Admission: RE | Admit: 2017-11-26 | Discharge: 2017-11-26 | Disposition: A | Discharge status: Home / Self Care | Payer: BLUE CROSS/BLUE SHIELD | Source: Ambulatory Visit | Attending: Family Medicine | Admitting: Family Medicine

## 2017-11-26 ENCOUNTER — Other Ambulatory Visit: Payer: Self-pay | Admitting: Family Medicine

## 2017-11-26 DIAGNOSIS — G894 Chronic pain syndrome: Secondary | ICD-10-CM

## 2017-11-26 DIAGNOSIS — M545 Low back pain, unspecified: Secondary | ICD-10-CM

## 2017-11-26 DIAGNOSIS — G8929 Other chronic pain: Secondary | ICD-10-CM

## 2017-11-26 DIAGNOSIS — M47816 Spondylosis without myelopathy or radiculopathy, lumbar region: Secondary | ICD-10-CM

## 2017-12-01 NOTE — Discharge Instructions (Signed)
Radio Frequency Ablation Post Procedure Discharge Instructions ° °1. May resume a regular diet and any medications that you routinely take (including pain medications). °2. No driving day of procedure. °3. Upon discharge go home and rest for at least 4 hours.  May use an ice pack as needed to injection sites on back. °4. Remove bandades later, today. ° ° ° °Please contact our office at [phone] for the following symptoms: ° °· Fever greater than 100 degrees °· Increased swelling, pain, or redness at injection site. ° ° °Thank you for visiting [facility]. ° °YOU MAY RESTART YOUR XARELTO TODAY. °

## 2017-12-02 ENCOUNTER — Telehealth: Payer: Self-pay | Admitting: Family Medicine

## 2017-12-02 ENCOUNTER — Other Ambulatory Visit: Payer: Self-pay | Admitting: Radiology

## 2017-12-02 ENCOUNTER — Encounter: Payer: Self-pay | Admitting: Radiology

## 2017-12-02 ENCOUNTER — Other Ambulatory Visit: Payer: Self-pay | Admitting: Family Medicine

## 2017-12-02 ENCOUNTER — Ambulatory Visit
Admission: RE | Admit: 2017-12-02 | Discharge: 2017-12-02 | Disposition: A | Discharge status: Home / Self Care | Payer: BLUE CROSS/BLUE SHIELD | Source: Ambulatory Visit | Attending: Family Medicine | Admitting: Family Medicine

## 2017-12-02 DIAGNOSIS — M545 Low back pain, unspecified: Secondary | ICD-10-CM

## 2017-12-02 DIAGNOSIS — G894 Chronic pain syndrome: Secondary | ICD-10-CM

## 2017-12-02 DIAGNOSIS — M47816 Spondylosis without myelopathy or radiculopathy, lumbar region: Secondary | ICD-10-CM

## 2017-12-02 DIAGNOSIS — G8929 Other chronic pain: Secondary | ICD-10-CM

## 2017-12-02 MED ORDER — FENTANYL CITRATE (PF) 100 MCG/2ML IJ SOLN: 25.0000 ug | INTRAMUSCULAR | Status: DC | PRN

## 2017-12-02 MED ORDER — MIDAZOLAM HCL 2 MG/2ML IJ SOLN: 1.0000 mg | INTRAMUSCULAR | Status: DC | PRN

## 2017-12-02 MED ORDER — SODIUM CHLORIDE 0.9 % IV SOLN: Freq: Once | INTRAVENOUS | Status: DC

## 2017-12-02 NOTE — Progress Notes (Signed)
Patient states she has been off Xarelto for at least the past two days.

## 2017-12-02 NOTE — Telephone Encounter (Signed)
Spoke with Dr Benard Rinkurnes.  Pt with RVR on table.  Recommend against proceding with RFA. Follow up with Cards.  Return to clinic if not well . Go to ED if worse.

## 2017-12-02 NOTE — Progress Notes (Signed)
Upon preparing patient for RFA, pt's heart rate was found to be in A Flutter with a rate of 140-180. Pt states she has been taking her metoprolol at home. Dr Benard Rinkurnes elected to cancel the procedure for today. Pt to follow up with cardiologist.

## 2017-12-02 NOTE — Progress Notes (Signed)
Patient was scheduled for Radiofrequency Ablation.  IV was started, Consent signed and patient placed on table.  Monitoring was placed in preparation for conscious sedation. Atrial Flutter with rate averaging 140 was observed.  Procedure cancelled.  Attempts [facility], Dr. Eugenie NorrieGomadam was not able to confirm this patient was in their records.  We contacted the patient approximately 1300 hours and encouraged her to proceed to any [facility], where CareEverywhere documents that she is indeed under the care of [facility].

## 2017-12-03 ENCOUNTER — Telehealth: Payer: Self-pay | Admitting: Physician Assistant

## 2017-12-03 ENCOUNTER — Ambulatory Visit: Payer: BLUE CROSS/BLUE SHIELD | Admitting: Physician Assistant

## 2017-12-03 ENCOUNTER — Encounter: Payer: Self-pay | Admitting: Physician Assistant

## 2017-12-03 VITALS — BP 154/63 | HR 46 | Ht 69.02 in | Wt 351.0 lb

## 2017-12-03 DIAGNOSIS — I4892 Unspecified atrial flutter: Secondary | ICD-10-CM

## 2017-12-03 DIAGNOSIS — I10 Essential (primary) hypertension: Secondary | ICD-10-CM

## 2017-12-03 DIAGNOSIS — R001 Bradycardia, unspecified: Secondary | ICD-10-CM | POA: Insufficient documentation

## 2017-12-03 MED ORDER — LISINOPRIL 5 MG PO TABS: 5.0000 mg | ORAL_TABLET | Freq: Every day | ORAL | 2 refills | Status: DC

## 2017-12-03 NOTE — Progress Notes (Signed)
Subjective:    Patient ID: Alicia BossDebra Davies, female    DOB: 12/06/1955, 62 y.o.   MRN: [id number]  HPI  Pt is a 62 yo female with HTN, HLD, OSA, paroxysmal atrial flutter who presents to the clinic to follow up after trying to have her radiofrequency ablation procedure for her back pain and having to cancel due to HR. She has had ablation procedure  She was seen last by cardiology, Dr. Eugenie NorrieGomadam, in June 2019. She was switched from diltiazem to metoprolol due to swelling in lower extremity.   Cardiology Plan in June: - continue toprol for rate control and xarelto for stroke prevention - continue PO lasix for lower extremity edema - continue livalo for cholesterol - mediterranean diet and regular exercise - advised weight loss  Today she is a little tired but feels fine. By the time she got home her pulse was back to baseline and in rhythm. She was late to take her metoprolol yesterday but not aware of any other triggers.    .. Active Ambulatory Problems    Diagnosis Date Noted  . B12 DEFICIENCY 12/11/2009  . UNSPECIFIED VITAMIN D DEFICIENCY 12/05/2009  . HYPERCALCEMIA 12/06/2009  . MENOPAUSAL SYNDROME 12/05/2009  . FATIGUE 12/05/2009  . Primary hyperparathyroidism (HCC) 01/29/2010  . POSTMENOPAUSAL STATUS 01/17/2010  . Benign cyst of left breast 10/28/2012  . Depression 07/21/2013  . Essential hypertension, benign 07/21/2013  . Hyperlipidemia 07/21/2013  . OSA (obstructive sleep apnea) 09/29/2013  . S/P right knee arthroscopy 02/28/2014  . Hyperglycemia 06/10/2014  . Seborrheic keratoses 07/14/2015  . Morbid obesity (HCC) 07/14/2015  . CKD (chronic kidney disease) stage 3, GFR 30-59 ml/min (HCC) 10/15/2015  . DDD (degenerative disc disease), lumbar 09/05/2016  . Chronic pain 12/02/2016  . Atrial flutter with rapid ventricular response (HCC) 04/07/2017  . Myalgia due to statin 06/11/2017  . Sinus bradycardia 12/03/2017   Resolved Ambulatory Problems    Diagnosis Date Noted   . Obesity, unspecified 12/05/2009  . Right knee pain 08/24/2010  . Severe obstructive sleep apnea 10/12/2012  . Severe obesity (BMI >= 40) (HCC) 07/21/2013  . Swelling of lower extremity 07/21/2013  . Muscle spasm 03/22/2014  . Piriformis syndrome of left side 07/14/2015  . Viral upper respiratory infection 01/26/2016   Past Medical History:  Diagnosis Date  . Hyperparathyroidism   . Kidney stones   . Paroxysmal atrial flutter (HCC) 03/2017      Review of Systems See HPI>     Objective:   Physical Exam  Constitutional: She is oriented to person, place, and time. She appears well-developed and well-nourished.  HENT:  Head: Normocephalic and atraumatic.  Cardiovascular: Normal rate and regular rhythm.  Sinus bradycardia.   Pulmonary/Chest: Effort normal and breath sounds normal.  Neurological: She is alert and oriented to person, place, and time.  Psychiatric: She has a normal mood and affect. Her behavior is normal.          Assessment & Plan:  [name].[name].Diagnoses and all orders for this visit:  Atrial flutter with rapid ventricular response (HCC)  Essential hypertension, benign -     lisinopril (PRINIVIL,ZESTRIL) 5 MG tablet; Take 1 tablet (5 mg total) by mouth daily.  Sinus bradycardia   Pt is not due to see cardiology until January. I will call them and see if can get her in sooner. Right now she is self-cardio converted. She is back to baseline sinus bradycardia. On EKG today shows sinus bradycardia with sinus arrhthymia. Continue on metoprolol. Try  to take same time every day. Avoid caffeine.watch out for any other triggers to convert to atrial flutter. Certainly keep check on HR since relatively asymptomatic may have to check pulse to know that she is an atrial flutter.   In my opinion as long as not in atrial flutter ok to have radiofrequency ablation procedure.  BP elevated today. Elevated for the last 3 visits. Will add lisinopril low dose 5mg  daily. Follow up  with cardiology for management.   Follow up as needed or in 3 months.   [name].Spent 30 minutes with patient and greater than 50 percent of visit spent counseling patient regarding treatment plan.

## 2017-12-03 NOTE — Telephone Encounter (Signed)
Can we call cardiology, Dr. Eugenie NorrieGomadam, see if we can get patient in sooner to follow up after another atrial flutter episode. She is scheduled for January 2020.   Also ask cardiology if ok with her proceding with her radiofrequency ablation procedure for her back as long as not in atrial flutter. I need to give clearance.

## 2017-12-03 NOTE — Telephone Encounter (Signed)
Patient has a follow up in December with Cardiology.

## 2017-12-17 NOTE — Addendum Note (Signed)
Addended by: Mallie SnooksERSKINE, Tysha Grismore R on: 12/17/2017 10:47 AM   Modules accepted: Orders

## 2017-12-25 ENCOUNTER — Encounter: Payer: Self-pay | Admitting: Family Medicine

## 2017-12-25 DIAGNOSIS — M7061 Trochanteric bursitis, right hip: Secondary | ICD-10-CM

## 2017-12-31 ENCOUNTER — Encounter: Payer: Self-pay | Admitting: Family Medicine

## 2018-01-07 ENCOUNTER — Encounter: Payer: Self-pay | Admitting: Physician Assistant

## 2018-01-09 ENCOUNTER — Other Ambulatory Visit: Payer: Self-pay | Admitting: Physician Assistant

## 2018-01-09 DIAGNOSIS — R197 Diarrhea, unspecified: Secondary | ICD-10-CM

## 2018-01-09 NOTE — Progress Notes (Signed)
amb  

## 2018-01-12 DIAGNOSIS — R197 Diarrhea, unspecified: Secondary | ICD-10-CM | POA: Diagnosis not present

## 2018-01-12 DIAGNOSIS — I4892 Unspecified atrial flutter: Secondary | ICD-10-CM | POA: Diagnosis not present

## 2018-01-12 DIAGNOSIS — R152 Fecal urgency: Secondary | ICD-10-CM | POA: Diagnosis not present

## 2018-01-22 DIAGNOSIS — R001 Bradycardia, unspecified: Secondary | ICD-10-CM | POA: Diagnosis not present

## 2018-01-22 DIAGNOSIS — R0602 Shortness of breath: Secondary | ICD-10-CM | POA: Diagnosis not present

## 2018-01-24 ENCOUNTER — Other Ambulatory Visit: Payer: Self-pay | Admitting: Physician Assistant

## 2018-01-24 DIAGNOSIS — F3342 Major depressive disorder, recurrent, in full remission: Secondary | ICD-10-CM

## 2018-01-27 DIAGNOSIS — M25551 Pain in right hip: Secondary | ICD-10-CM | POA: Diagnosis not present

## 2018-02-09 DIAGNOSIS — M25551 Pain in right hip: Secondary | ICD-10-CM | POA: Diagnosis not present

## 2018-02-09 DIAGNOSIS — S76311A Strain of muscle, fascia and tendon of the posterior muscle group at thigh level, right thigh, initial encounter: Secondary | ICD-10-CM | POA: Diagnosis not present

## 2018-02-09 DIAGNOSIS — M24151 Other articular cartilage disorders, right hip: Secondary | ICD-10-CM | POA: Diagnosis not present

## 2018-02-10 DIAGNOSIS — M25551 Pain in right hip: Secondary | ICD-10-CM | POA: Diagnosis not present

## 2018-02-16 DIAGNOSIS — K589 Irritable bowel syndrome without diarrhea: Secondary | ICD-10-CM | POA: Diagnosis not present

## 2018-02-16 DIAGNOSIS — R152 Fecal urgency: Secondary | ICD-10-CM | POA: Diagnosis not present

## 2018-02-27 DIAGNOSIS — M545 Low back pain: Secondary | ICD-10-CM | POA: Diagnosis not present

## 2018-03-05 ENCOUNTER — Other Ambulatory Visit: Payer: Self-pay | Admitting: Physician Assistant

## 2018-03-05 DIAGNOSIS — I10 Essential (primary) hypertension: Secondary | ICD-10-CM

## 2018-03-12 DIAGNOSIS — M5136 Other intervertebral disc degeneration, lumbar region: Secondary | ICD-10-CM | POA: Diagnosis not present

## 2018-03-12 DIAGNOSIS — M5126 Other intervertebral disc displacement, lumbar region: Secondary | ICD-10-CM | POA: Diagnosis not present

## 2018-03-12 DIAGNOSIS — M47816 Spondylosis without myelopathy or radiculopathy, lumbar region: Secondary | ICD-10-CM | POA: Diagnosis not present

## 2018-03-12 DIAGNOSIS — M419 Scoliosis, unspecified: Secondary | ICD-10-CM | POA: Diagnosis not present

## 2018-03-17 DIAGNOSIS — Z9989 Dependence on other enabling machines and devices: Secondary | ICD-10-CM | POA: Diagnosis not present

## 2018-03-17 DIAGNOSIS — I4892 Unspecified atrial flutter: Secondary | ICD-10-CM | POA: Diagnosis not present

## 2018-03-17 DIAGNOSIS — G4733 Obstructive sleep apnea (adult) (pediatric): Secondary | ICD-10-CM | POA: Diagnosis not present

## 2018-03-31 DIAGNOSIS — M461 Sacroiliitis, not elsewhere classified: Secondary | ICD-10-CM | POA: Diagnosis not present

## 2018-04-26 ENCOUNTER — Other Ambulatory Visit: Payer: Self-pay | Admitting: Physician Assistant

## 2018-04-26 DIAGNOSIS — F3342 Major depressive disorder, recurrent, in full remission: Secondary | ICD-10-CM

## 2018-05-12 ENCOUNTER — Telehealth: Payer: Self-pay | Admitting: Neurology

## 2018-05-12 DIAGNOSIS — I10 Essential (primary) hypertension: Secondary | ICD-10-CM | POA: Diagnosis not present

## 2018-05-12 DIAGNOSIS — I4892 Unspecified atrial flutter: Secondary | ICD-10-CM | POA: Diagnosis not present

## 2018-05-12 DIAGNOSIS — G4733 Obstructive sleep apnea (adult) (pediatric): Secondary | ICD-10-CM | POA: Diagnosis not present

## 2018-05-12 DIAGNOSIS — Z9989 Dependence on other enabling machines and devices: Secondary | ICD-10-CM | POA: Diagnosis not present

## 2018-05-12 NOTE — Telephone Encounter (Signed)
Patient left vm stating she has been having some jaw pain that is starting to effect her eating and has developed a clicking sound. She wanted to know whether she should see [place] or the dentist. I spoke with Marylene Land, triage nurse, and she advise I could schedule virtual visit with [facility] for evaluation of tmj. Appt made with patient.

## 2018-05-14 ENCOUNTER — Ambulatory Visit (INDEPENDENT_AMBULATORY_CARE_PROVIDER_SITE_OTHER): Payer: BLUE CROSS/BLUE SHIELD | Admitting: Physician Assistant

## 2018-05-14 ENCOUNTER — Encounter: Payer: Self-pay | Admitting: Physician Assistant

## 2018-05-14 VITALS — HR 54 | Ht 69.0 in | Wt 340.0 lb

## 2018-05-14 DIAGNOSIS — I4892 Unspecified atrial flutter: Secondary | ICD-10-CM

## 2018-05-14 DIAGNOSIS — I10 Essential (primary) hypertension: Secondary | ICD-10-CM | POA: Diagnosis not present

## 2018-05-14 DIAGNOSIS — M26609 Unspecified temporomandibular joint disorder, unspecified side: Secondary | ICD-10-CM | POA: Diagnosis not present

## 2018-05-14 MED ORDER — PREDNISONE 50 MG PO TABS: ORAL_TABLET | ORAL | 0 refills | Status: DC

## 2018-05-14 NOTE — Progress Notes (Signed)
-  metoprolol decreased to 12.5 mg once a day by cardiologist due to low HR.. med list updated   -jaw pain started last week, sore, difficult to chew. Heard popping on L side when closed mouth x1 day. No tooth pain.   -takes some Tylenol for SX, helps a little  -more issues in AM and during/after eating   -has "awkward feeling of trying to close mouth like after you go to the dentist"

## 2018-05-14 NOTE — Progress Notes (Signed)
Patient ID: Alicia Davies, female   DOB: 26-Jan-1955, 63 y.o.   MRN: [id number] .[name].Virtual Visit via Video Note  I connected with Alicia Davies on 05/18/18 at 10:50 AM EDT by a video enabled telemedicine application and verified that I am speaking with the correct person using two identifiers.  Location: Patient: home Provider: home   I discussed the limitations of evaluation and management by telemedicine and the availability of in person appointments. The patient expressed understanding and agreed to proceed.  History of Present Illness: Pt is a 63 yo obese female with Atrial Flutter with RVR, HTN, OSA, MDD who calls in with left upper jaw pain for 1 week.   She does not remember any injury or hyper extending the jaw. Pain makes it difficult to chew. At times she hears some popping on the left side. Denies any sinus pressure, ear pain, or tooth pain. No fever, chills, body aches. She takes some tylenol for pain and does help. She was told by cardiologist not to take NsAIDs. She tends to have more pain in the morning and after eating. She does not know if she grinds her teeth. She does use a CPAP all night long. She often has the discomort feeling when your mouth has been open a while and you have to shut it.   Cardiology decreased metroprolol to 12.5mg  due to bradycardia.   .. Active Ambulatory Problems    Diagnosis Date Noted  . B12 DEFICIENCY 12/11/2009  . UNSPECIFIED VITAMIN D DEFICIENCY 12/05/2009  . HYPERCALCEMIA 12/06/2009  . MENOPAUSAL SYNDROME 12/05/2009  . FATIGUE 12/05/2009  . Primary hyperparathyroidism (HCC) 01/29/2010  . POSTMENOPAUSAL STATUS 01/17/2010  . Benign cyst of left breast 10/28/2012  . Depression 07/21/2013  . Essential hypertension, benign 07/21/2013  . Hyperlipidemia 07/21/2013  . OSA (obstructive sleep apnea) 09/29/2013  . S/P right knee arthroscopy 02/28/2014  . Hyperglycemia 06/10/2014  . Seborrheic keratoses 07/14/2015  . Morbid obesity (HCC) 07/14/2015   . CKD (chronic kidney disease) stage 3, GFR 30-59 ml/min (HCC) 10/15/2015  . DDD (degenerative disc disease), lumbar 09/05/2016  . Chronic pain 12/02/2016  . Atrial flutter with rapid ventricular response (HCC) 04/07/2017  . Myalgia due to statin 06/11/2017  . Sinus bradycardia 12/03/2017  . TMJ (temporomandibular joint syndrome) 05/14/2018   Resolved Ambulatory Problems    Diagnosis Date Noted  . Obesity, unspecified 12/05/2009  . Right knee pain 08/24/2010  . Severe obstructive sleep apnea 10/12/2012  . Severe obesity (BMI >= 40) (HCC) 07/21/2013  . Swelling of lower extremity 07/21/2013  . Muscle spasm 03/22/2014  . Piriformis syndrome of left side 07/14/2015  . Viral upper respiratory infection 01/26/2016   Past Medical History:  Diagnosis Date  . Hyperparathyroidism   . Kidney stones   . Paroxysmal atrial flutter (HCC) 03/2017   Reviewed med, allergy, problem list.         Observations/Objective: No acute distress.  Normal appearance.  No facial asymmetry.  Pain with ROM of lower jaw to exercises.   .. Today's Vitals   05/14/18 1036  Pulse: (!) 54  SpO2: 97%  Weight: (!) 340 lb (154.2 kg)  Height: 5\' 9"  (1.753 m)   Body mass index is 50.21 kg/m.   Assessment and Plan: [name].[name].Alicia Davies was seen today for jaw pain.  Diagnoses and all orders for this visit:  TMJ (temporomandibular joint syndrome) -     predniSONE (DELTASONE) 50 MG tablet; One tab PO daily for 5 days.  Essential hypertension, benign  Atrial flutter  with rapid ventricular response (HCC)   Issues seems consistent with TMJ of left side. Pt not supposed to take NsAIDs. For now stick with tylenol as needed.explained to patient importance of jaw rest.  Keep to a soft foods diet to give jaw some rest for next week or so. Use ice/heat compresses as needed. Will mail or access on EMR exercise to start for TMJ. May need to consider a dental appliance if grinding teeth at night. Try to avoid but if pain  persistent or range of motion continues to decrease not improve try burst of prednisone. Follow up in 1-2 weeks as needed or sooner if any changes.   Follow Up Instructions:    I discussed the assessment and treatment plan with the patient. The patient was provided an opportunity to ask questions and all were answered. The patient agreed with the plan and demonstrated an understanding of the instructions.   The patient was advised to call back or seek an in-person evaluation if the symptoms worsen or if the condition fails to improve as anticipated.  I provided 15 minutes of non-face-to-face time during this encounter.   Tandy Gaw, PA-C

## 2018-05-14 NOTE — Patient Instructions (Signed)
Jaw Range of Motion Exercises  Jaw range of motion exercises are exercises that help your jaw move better. Exercises that help you have good posture (postural exercises) also help relieve jaw discomfort. These are often done along with range of motion exercises. These exercises can help prevent or improve:   Difficulty opening your mouth.   Pain in your jaw while it is open or closed.   Temporomandibular joint (TMJ) pain.   Headache caused by jaw tension.  Take other actions to prevent or relieve jaw pain, such as:   Avoiding things that cause or increase jaw pain. This may include:  ? Chewing gum or eating hard foods.  ? Clenching your jaw or teeth, grinding your teeth, or keeping tension in your jaw muscles.  ? Opening your mouth wide, such as for a big yawn.  ? Leaning on your jaw, such as resting your jaw in your hand while leaning on a desk.   Putting ice on your jaw.  ? Put ice in a plastic bag.  ? Place a towel between your skin and the bag.  ? Leave the ice on for 10-15 minutes, 2-3 times a day.  Only do jaw exercises that your health care provider approves of. Only move your jaw as far as it can comfortably go in each direction. Do not move your jaw into positions that cause pain.  Range of motion exercises  Repeat each of these exercises 8 times, 1-2 times a day, or as told by your health care provider.  Exercise A: Forward protrusion  1. Push your jaw forward. Hold this position for 1-2 seconds.  2. Allow your jaw to return to its normal position and rest it there for 1-2 seconds.  Exercise B: Controlled opening  1. Stand or sit in front of a mirror. Place your tongue on the roof of your mouth, just behind your top teeth.  2. Keeping your tongue on the roof of your mouth, slowly open and close your mouth.  3. While you open and close your mouth, watch your jaw in the mirror. Try to keep your jaw from moving to one side or the other.  Exercise C: Right and left motion  1. Move your jaw right. Hold  this position for 1-2 seconds. Allow your jaw to return to its normal position, and rest it there for 1-2 seconds.  2. Move your jaw left. Hold this position for 1-2 seconds. Allow your jaw to return to its normal position, and rest it there for 1-2 seconds.  Postural exercises  Exercise A: Chin tucks  1. You can do this exercise sitting, standing, or lying down.  2. Move your head straight back, keeping your head level. You can guide the movement by placing your fingers on your chin to push your jaw back in an even motion. You should be able to feel a double chin form at the end of the motion.  3. Hold this position for 5 seconds. Repeat 10-15 times.  Exercise B: Shoulder blade squeeze  1. Sit or stand.  2. Bend your elbows to about 90 degrees, which is the shape of a capital letter "L." Keep your upper arms by your body.  3. Squeeze your shoulder blades down and back, as though you were trying to touch your elbows behind you. Do not shrug your shoulders or move your head.  4. Hold this position for 5 seconds. Repeat 10-15 times.  Exercise C: Chest stretch  1. Stand facing a   corner.  2. Put both of your hands and your forearms on the wall, with your arms wide apart.  3. Make sure your arms are at a 90-degree angle to your body. This means that you should hold your arms straight out from your body, level with the floor.  4. Step in toward the corner. Do not lean in.  5. Hold this position for 30 seconds. Repeat 3 times.  Contact a health care provider if you have:   Jaw pain that is new or gets worse.   Clicking or popping sounds while doing the exercises.  Get help right away if:   Your jaw is stuck in one place and you cannot move it.   You cannot open or close your mouth.  This information is not intended to replace advice given to you by your health care provider. Make sure you discuss any questions you have with your health care provider.  Document Released: 12/14/2007 Document Revised: 11/27/2016 Document  Reviewed: 11/27/2016  Elsevier Interactive Patient Education  2019 [employer]  Temporomandibular Joint Syndrome    Temporomandibular joint syndrome (TMJ syndrome) is a condition that causes pain in the temporomandibular joints. These joints are located near your ears and allow your jaw to open and close. For people with TMJ syndrome, chewing, biting, or other movements of the jaw can be difficult or painful.  TMJ syndrome is often mild and goes away within a few weeks. However, sometimes the condition becomes a long-term (chronic) problem.  What are the causes?  This condition may be caused by:   Grinding your teeth or clenching your jaw. Some people do this when they are under stress.   Arthritis.   Injury to the jaw.   Head or neck injury.   Teeth or dentures that are not aligned well.  In some cases, the cause of TMJ syndrome may not be known.  What are the signs or symptoms?  The most common symptom of this condition is an aching pain on the side of the head in the area of the TMJ. Other symptoms may include:   Pain when moving your jaw, such as when chewing or biting.   Being unable to open your jaw all the way.   Making a clicking sound when you open your mouth.   Headache.   Earache.   Neck or shoulder pain.  How is this diagnosed?  This condition may be diagnosed based on:   Your symptoms and medical history.   A physical exam. Your health care provider may check the range of motion of your jaw.   Imaging tests, such as X-rays or an MRI.  You may also need to see your dentist, who will determine if your teeth and jaw are lined up correctly.  How is this treated?  TMJ syndrome often goes away on its own. If treatment is needed, the options may include:   Eating soft foods and applying ice or heat.   Medicines to relieve pain or inflammation.   Medicines or massage to relax the muscles.   A splint, bite plate, or mouthpiece to prevent teeth grinding or jaw clenching.   Relaxation  techniques or counseling to help reduce stress.   A therapy for pain in which an electrical current is applied to the nerves through the skin (transcutaneous electrical nerve stimulation).   Acupuncture. This is sometimes helpful to relieve pain.   Jaw surgery. This is rarely needed.  Follow these instructions at home:      Eating and drinking   Eat a soft diet if you are having trouble chewing.   Avoid foods that require a lot of chewing. Do not chew gum.  General instructions   Take over-the-counter and prescription medicines only as told by your health care provider.   If directed, put ice on the painful area.  ? Put ice in a plastic bag.  ? Place a towel between your skin and the bag.  ? Leave the ice on for 20 minutes, 2-3 times a day.   Apply a warm, wet cloth (warm compress) to the painful area as directed.   Massage your jaw area and do any jaw stretching exercises as told by your health care provider.   If you were given a splint, bite plate, or mouthpiece, wear it as told by your health care provider.   Keep all follow-up visits as told by your health care provider. This is important.  Contact a health care provider if:   You are having trouble eating.   You have new or worsening symptoms.  Get help right away if:   Your jaw locks open or closed.  Summary   Temporomandibular joint syndrome (TMJ syndrome) is a condition that causes pain in the temporomandibular joints. These joints are located near your ears and allow your jaw to open and close.   TMJ syndrome is often mild and goes away within a few weeks. However, sometimes the condition becomes a long-term (chronic) problem.   Symptoms include an aching pain on the side of the head in the area of the TMJ, pain when chewing or biting, and being unable to open your jaw all the way. You may also make a clicking sound when you open your mouth.   TMJ syndrome often goes away on its own. If treatment is needed, it may include medicines to  relieve pain, reduce inflammation, or relax the muscles. A splint, bite plate, or mouthpiece may also be used to prevent teeth grinding or jaw clenching.  This information is not intended to replace advice given to you by your health care provider. Make sure you discuss any questions you have with your health care provider.  Document Released: 09/25/2000 Document Revised: 02/11/2017 Document Reviewed: 02/11/2017  Elsevier Interactive Patient Education  2019 [employer]

## 2018-05-18 ENCOUNTER — Encounter: Payer: Self-pay | Admitting: Physician Assistant

## 2018-05-18 NOTE — Progress Notes (Signed)
TMJ exercises mailed to patient.

## 2018-05-19 ENCOUNTER — Encounter: Payer: Self-pay | Admitting: Physician Assistant

## 2018-05-26 DIAGNOSIS — R152 Fecal urgency: Secondary | ICD-10-CM | POA: Diagnosis not present

## 2018-05-26 DIAGNOSIS — R197 Diarrhea, unspecified: Secondary | ICD-10-CM | POA: Diagnosis not present

## 2018-05-26 DIAGNOSIS — R159 Full incontinence of feces: Secondary | ICD-10-CM | POA: Diagnosis not present

## 2018-05-27 ENCOUNTER — Other Ambulatory Visit: Payer: Self-pay | Admitting: Physician Assistant

## 2018-05-27 DIAGNOSIS — F3342 Major depressive disorder, recurrent, in full remission: Secondary | ICD-10-CM

## 2018-05-27 NOTE — Telephone Encounter (Signed)
Please call patient and schedule virtual/office visit with Ranson Belluomini for follow up prior to refills. Thanks!  

## 2018-05-29 ENCOUNTER — Encounter: Payer: Self-pay | Admitting: Physician Assistant

## 2018-05-29 ENCOUNTER — Ambulatory Visit (INDEPENDENT_AMBULATORY_CARE_PROVIDER_SITE_OTHER): Payer: BLUE CROSS/BLUE SHIELD | Admitting: Physician Assistant

## 2018-05-29 VITALS — HR 68 | Ht 69.0 in | Wt 340.0 lb

## 2018-05-29 DIAGNOSIS — M26609 Unspecified temporomandibular joint disorder, unspecified side: Secondary | ICD-10-CM

## 2018-05-29 DIAGNOSIS — F3342 Major depressive disorder, recurrent, in full remission: Secondary | ICD-10-CM

## 2018-05-29 MED ORDER — SERTRALINE HCL 50 MG PO TABS: 50.0000 mg | ORAL_TABLET | Freq: Every day | ORAL | 3 refills | Status: DC

## 2018-05-29 NOTE — Progress Notes (Signed)
Patient ID: Alicia Davies, female   DOB: [birth year], 63 y.o.   MRN: [id number] .Marland KitchenVirtual Visit via Video Note  I connected with Drisana Hui on 05/29/18 at 10:50 AM EDT by a video enabled telemedicine application and verified that I am speaking with the correct person using two identifiers.  Location: Patient: home Provider: home   I discussed the limitations of evaluation and management by telemedicine and the availability of in person appointments. The patient expressed understanding and agreed to proceed.  History of Present Illness: Pt is a 63 yo female with MCC, Atrial Flutter with RVR, HTN, OSA who calls in for follow up on left TMJ and refills on zoloft.   Pt is doing great with zoloft. No concerns. Normal mood. No problems with anxiety or depression. No side effects.   Her left TMJ continues to be painful. She went to dentist and they suggested mouthguard. She has not gotten one yet. They did give her medrol dose pack. She has had 2 doses. She maybe has some improvement this morning. She did not start valium at night because of making her too tired. Cannot do NSAIDs. She did start some of exercises we sent her. She has follow up with dentist.   .. Active Ambulatory Problems    Diagnosis Date Noted  . B12 DEFICIENCY 12/11/2009  . UNSPECIFIED VITAMIN D DEFICIENCY 12/05/2009  . HYPERCALCEMIA 12/06/2009  . MENOPAUSAL SYNDROME 12/05/2009  . FATIGUE 12/05/2009  . Primary hyperparathyroidism (HCC) 01/29/2010  . POSTMENOPAUSAL STATUS 01/17/2010  . Benign cyst of left breast 10/28/2012  . Depression 07/21/2013  . Essential hypertension, benign 07/21/2013  . Hyperlipidemia 07/21/2013  . OSA (obstructive sleep apnea) 09/29/2013  . S/P right knee arthroscopy 02/28/2014  . Hyperglycemia 06/10/2014  . Seborrheic keratoses 07/14/2015  . Morbid obesity (HCC) 07/14/2015  . CKD (chronic kidney disease) stage 3, GFR 30-59 ml/min (HCC) 10/15/2015  . DDD (degenerative disc disease), lumbar  09/05/2016  . Chronic pain 12/02/2016  . Atrial flutter with rapid ventricular response (HCC) 04/07/2017  . Myalgia due to statin 06/11/2017  . Sinus bradycardia 12/03/2017  . TMJ (temporomandibular joint syndrome) 05/14/2018   Resolved Ambulatory Problems    Diagnosis Date Noted  . Obesity, unspecified 12/05/2009  . Right knee pain 08/24/2010  . Severe obstructive sleep apnea 10/12/2012  . Severe obesity (BMI >= 40) (HCC) 07/21/2013  . Swelling of lower extremity 07/21/2013  . Muscle spasm 03/22/2014  . Piriformis syndrome of left side 07/14/2015  . Viral upper respiratory infection 01/26/2016   Past Medical History:  Diagnosis Date  . Hyperparathyroidism   . Kidney stones   . Paroxysmal atrial flutter (HCC) 03/2017   Reviewed med, allergy, problem list.   Observations/Objective: No acute distress.  Normal mood.  Normal appearance.  Pain with movement of jaw opening and side to side.  .. Today's Vitals   05/29/18 0936  Pulse: 68  SpO2: 98%  Weight: (!) 340 lb (154.2 kg)  Height: 5\' 9"  (1.753 m)  .[name] Depression screen [facility] 2/9 05/29/2018 05/14/2018 12/03/2017 04/07/2017 09/02/2016  Decreased Interest 0 0 0 0 0  Down, Depressed, Hopeless 0 0 0 0 0  PHQ - 2 Score 0 0 0 0 0  Altered sleeping 0 0 1 - 0  Tired, decreased energy 0 0 1 - 0  Change in appetite 1 1 1  - 0  Feeling bad or failure about yourself  0 0 0 - 0  Trouble concentrating 0 0 0 - 0  Moving slowly or  fidgety/restless 0 0 0 - 0  Suicidal thoughts 0 0 0 - 0  PHQ-9 Score 1 1 3  - 0  Difficult doing work/chores Not difficult at all Not difficult at all Not difficult at all - Not difficult at all   .[name]. GAD 7 : Generalized Anxiety Score 05/29/2018 05/14/2018 12/03/2017 09/02/2016  Nervous, Anxious, on Edge 0 0 0 1  Control/stop worrying 0 0 0 1  Worry too much - different things 0 0 1 1  Trouble relaxing 0 0 0 0  Restless 0 0 0 0  Easily annoyed or irritable 1 0 0 0  Afraid - awful might happen 0 0 0 0  Total  GAD 7 Score 1 0 1 3  Anxiety Difficulty Not difficult at all Not difficult at all Not difficult at all -     Body mass index is 50.21 kg/m.   Assessment and Plan: [name].[name].Diagnoses and all orders for this visit:  TMJ (temporomandibular joint syndrome)  Recurrent major depressive disorder, in full remission (HCC) -     sertraline (ZOLOFT) 50 MG tablet; Take 1 tablet (50 mg total) by mouth daily.   Anxiety and depression appear very controlled. Refilled for one year.   Discussed continued follow up with dentist. May need imaging if symptoms persist. Discussed warm compresses and doing jaw exercises regularly. SOFT foods and no chewing gum or candy. Consider when things open back up massage/PT for jaw muscles. Tylenol for pain. Finish steroid pack.    Follow Up Instructions:    I discussed the assessment and treatment plan with the patient. The patient was provided an opportunity to ask questions and all were answered. The patient agreed with the plan and demonstrated an understanding of the instructions.   The patient was advised to call back or seek an in-person evaluation if the symptoms worsen or if the condition fails to improve as anticipated.  I provided 25 minutes of non-face-to-face time during this encounter.   Tandy GawJade Kaeo Jacome, PA-C

## 2018-05-29 NOTE — Progress Notes (Deleted)
Patient still having issues with jaw - saw dentist and put on Medrol dose pack. Other than that doing well. Just needs refills of Zoloft. PHQ9-GAD7 completed.

## 2018-06-07 ENCOUNTER — Other Ambulatory Visit: Payer: Self-pay | Admitting: Physician Assistant

## 2018-06-07 DIAGNOSIS — I10 Essential (primary) hypertension: Secondary | ICD-10-CM

## 2018-06-19 DIAGNOSIS — M79671 Pain in right foot: Secondary | ICD-10-CM | POA: Diagnosis not present

## 2018-07-01 DIAGNOSIS — I10 Essential (primary) hypertension: Secondary | ICD-10-CM | POA: Diagnosis not present

## 2018-07-01 DIAGNOSIS — G4733 Obstructive sleep apnea (adult) (pediatric): Secondary | ICD-10-CM | POA: Diagnosis not present

## 2018-07-01 DIAGNOSIS — Z9989 Dependence on other enabling machines and devices: Secondary | ICD-10-CM | POA: Diagnosis not present

## 2018-07-01 DIAGNOSIS — I4892 Unspecified atrial flutter: Secondary | ICD-10-CM | POA: Diagnosis not present

## 2018-07-08 DIAGNOSIS — M79671 Pain in right foot: Secondary | ICD-10-CM | POA: Diagnosis not present

## 2018-08-07 DIAGNOSIS — M79671 Pain in right foot: Secondary | ICD-10-CM | POA: Diagnosis not present

## 2018-08-27 DIAGNOSIS — G4733 Obstructive sleep apnea (adult) (pediatric): Secondary | ICD-10-CM | POA: Diagnosis not present

## 2018-09-11 ENCOUNTER — Other Ambulatory Visit: Payer: Self-pay | Admitting: Physician Assistant

## 2018-09-11 DIAGNOSIS — I10 Essential (primary) hypertension: Secondary | ICD-10-CM

## 2018-10-05 DIAGNOSIS — M461 Sacroiliitis, not elsewhere classified: Secondary | ICD-10-CM | POA: Diagnosis not present

## 2018-10-20 DIAGNOSIS — R001 Bradycardia, unspecified: Secondary | ICD-10-CM | POA: Diagnosis not present

## 2018-10-30 ENCOUNTER — Ambulatory Visit (INDEPENDENT_AMBULATORY_CARE_PROVIDER_SITE_OTHER): Payer: BLUE CROSS/BLUE SHIELD | Admitting: Physician Assistant

## 2018-10-30 ENCOUNTER — Other Ambulatory Visit: Payer: Self-pay

## 2018-10-30 VITALS — Ht 69.0 in | Wt 340.0 lb

## 2018-10-30 DIAGNOSIS — G4452 New daily persistent headache (NDPH): Secondary | ICD-10-CM

## 2018-10-30 DIAGNOSIS — R5383 Other fatigue: Secondary | ICD-10-CM | POA: Diagnosis not present

## 2018-10-30 MED ORDER — BACLOFEN 10 MG PO TABS: 10.0000 mg | ORAL_TABLET | Freq: Every day | ORAL | 5 refills | Status: DC

## 2018-10-30 MED ORDER — RIZATRIPTAN BENZOATE 5 MG PO TABS: 5.0000 mg | ORAL_TABLET | ORAL | 0 refills | Status: DC | PRN

## 2018-10-30 NOTE — Progress Notes (Signed)
Patient ID: Alicia Davies, female   DOB: 06-Jul-1955, 63 y.o.   MRN: [id number] .Marland KitchenVirtual Visit via Video Note  I connected with [name] on 11/02/18 at 10:50 AM EDT by a video enabled telemedicine application and verified that I am speaking with the correct person using two identifiers.  Location: Patient: home Provider: clinic   I discussed the limitations of evaluation and management by telemedicine and the availability of in person appointments. The patient expressed understanding and agreed to proceed.  History of Present Illness: Pt is a 63 yo female with atrial flutter, HTN, hyperparathyroidism, TMJ, CKD who calls into the clinic with new headache. Pt has had ongoing headache for next 1 and 1/2 week. Headache is every day. Tylenol dose not help and cannot take NsAIDS due to CKD. Headache starts left back of head and moves to the front. No vision changes. No light or sound sensitivity. No nausea or vomiting. No speech changes or upper ext strength changes. Laying down helps some. She is also really tired. She denies any new medication or dosage changes. She denies any fever, chills, sinus pressure, cough, runny nose.   The only change has been her CPAP mask. She got a new mask and has taken some nights to get adjusted to.   .. Active Ambulatory Problems    Diagnosis Date Noted  . B12 DEFICIENCY 12/11/2009  . UNSPECIFIED VITAMIN D DEFICIENCY 12/05/2009  . HYPERCALCEMIA 12/06/2009  . MENOPAUSAL SYNDROME 12/05/2009  . FATIGUE 12/05/2009  . Primary hyperparathyroidism ([place]) 01/29/2010  . POSTMENOPAUSAL STATUS 01/17/2010  . Benign cyst of left breast 10/28/2012  . Depression 07/21/2013  . Essential hypertension, benign 07/21/2013  . Hyperlipidemia 07/21/2013  . OSA (obstructive sleep apnea) 09/29/2013  . S/P right knee arthroscopy 02/28/2014  . Hyperglycemia 06/10/2014  . Seborrheic keratoses 07/14/2015  . Morbid obesity ([place]) 07/14/2015  . CKD (chronic kidney disease) stage 3, GFR  30-59 ml/min 10/15/2015  . DDD (degenerative disc disease), lumbar 09/05/2016  . Chronic pain 12/02/2016  . Atrial flutter with rapid ventricular response ([place]) 04/07/2017  . Myalgia due to statin 06/11/2017  . Sinus bradycardia 12/03/2017  . TMJ (temporomandibular joint syndrome) 05/14/2018  . New daily persistent headache 11/02/2018  . No energy 11/02/2018   Resolved Ambulatory Problems    Diagnosis Date Noted  . Obesity, unspecified 12/05/2009  . Right knee pain 08/24/2010  . Severe obstructive sleep apnea 10/12/2012  . Severe obesity (BMI >= 40) ([place]) 07/21/2013  . Swelling of lower extremity 07/21/2013  . Muscle spasm 03/22/2014  . Piriformis syndrome of left side 07/14/2015  . Viral upper respiratory infection 01/26/2016   Past Medical History:  Diagnosis Date  . Hyperparathyroidism   . Kidney stones   . Paroxysmal atrial flutter ([place]) 03/2017   Reviewed med, allergy, problem list.     Observations/Objective: No acute distress. No coughing.  Normal appearance.  NROM of head.    .. Today's Vitals   10/30/18 1031  Weight: (!) 340 lb (154.2 kg)  Height: 5\' 9"  (1.753 m)   Body mass index is 50.21 kg/m.    Assessment and Plan: Marland KitchenMarland KitchenAliya was seen today for fatigue and headache.  Diagnoses and all orders for this visit:  New daily persistent headache -     baclofen (LIORESAL) 10 MG tablet; Take 1 tablet (10 mg total) by mouth at bedtime. -     rizatriptan (MAXALT) 5 MG tablet; Take 1 tablet (5 mg total) by mouth as needed for migraine. May repeat in  2 hours if needed  No energy   Unclear etiology of headaches. No neuro or red flag symptoms. Could be her new CPAP mask. Could also be why she is so tired. Give the change another week or so. Certainly could be how she is sleeping. Baclofen given to take at bedtime. Does not sound classic of migraine. maxalt given at onset to see if could help rescue. If not improving will get some basic labs and consider CT of  head. Call with any strength/speech/vision changes.    Follow Up Instructions:    I discussed the assessment and treatment plan with the patient. The patient was provided an opportunity to ask questions and all were answered. The patient agreed with the plan and demonstrated an understanding of the instructions.   The patient was advised to call back or seek an in-person evaluation if the symptoms worsen or if the condition fails to improve as anticipated.   Tandy Gaw, PA-C

## 2018-10-30 NOTE — Progress Notes (Signed)
Fatigue Can sleep all the time and still be tired On Heart monitor til Nov 5 after having heart palpitations  HA- 1.5 weeks Constant/Not unbearable pain/just annoying No n/v No sensitivity to light/noise

## 2018-11-02 ENCOUNTER — Encounter: Payer: Self-pay | Admitting: Physician Assistant

## 2018-11-02 DIAGNOSIS — R5383 Other fatigue: Secondary | ICD-10-CM | POA: Insufficient documentation

## 2018-11-02 DIAGNOSIS — G4452 New daily persistent headache (NDPH): Secondary | ICD-10-CM | POA: Insufficient documentation

## 2018-11-16 ENCOUNTER — Telehealth: Payer: Self-pay | Admitting: Physician Assistant

## 2018-11-16 DIAGNOSIS — Z1231 Encounter for screening mammogram for malignant neoplasm of breast: Secondary | ICD-10-CM

## 2018-11-16 NOTE — Telephone Encounter (Signed)
Need flu shot and any others I may need at 7. What shots will she be due for? Thanks.

## 2018-11-17 NOTE — Telephone Encounter (Signed)
She needs flu shot and I do not see that she has had shingles vaccine. She is also due for mammogram.

## 2018-11-17 NOTE — Telephone Encounter (Signed)
Patient advised.   Mammogram ordered. Patient is going to get mammogram set up then call us to schedule flu and shingles shot on same day

## 2018-11-18 ENCOUNTER — Encounter: Payer: Self-pay | Admitting: Physician Assistant

## 2018-11-27 DIAGNOSIS — R001 Bradycardia, unspecified: Secondary | ICD-10-CM | POA: Diagnosis not present

## 2018-12-11 ENCOUNTER — Other Ambulatory Visit: Payer: Self-pay | Admitting: Physician Assistant

## 2018-12-11 DIAGNOSIS — I10 Essential (primary) hypertension: Secondary | ICD-10-CM

## 2018-12-15 DIAGNOSIS — M25552 Pain in left hip: Secondary | ICD-10-CM | POA: Diagnosis not present

## 2018-12-15 DIAGNOSIS — M25562 Pain in left knee: Secondary | ICD-10-CM | POA: Diagnosis not present

## 2018-12-16 ENCOUNTER — Ambulatory Visit (INDEPENDENT_AMBULATORY_CARE_PROVIDER_SITE_OTHER): Payer: BLUE CROSS/BLUE SHIELD | Admitting: Physician Assistant

## 2018-12-16 ENCOUNTER — Ambulatory Visit (INDEPENDENT_AMBULATORY_CARE_PROVIDER_SITE_OTHER): Payer: BLUE CROSS/BLUE SHIELD

## 2018-12-16 ENCOUNTER — Other Ambulatory Visit: Payer: Self-pay

## 2018-12-16 DIAGNOSIS — Z23 Encounter for immunization: Secondary | ICD-10-CM

## 2018-12-16 DIAGNOSIS — Z1231 Encounter for screening mammogram for malignant neoplasm of breast: Secondary | ICD-10-CM

## 2018-12-17 ENCOUNTER — Other Ambulatory Visit: Payer: Self-pay | Admitting: Physician Assistant

## 2018-12-17 DIAGNOSIS — R928 Other abnormal and inconclusive findings on diagnostic imaging of breast: Secondary | ICD-10-CM

## 2018-12-18 NOTE — Telephone Encounter (Signed)
Klee,   Mammogram did show a possible mass in left breast. Let me know if not been scheduled for ultrasound to further evaluate.   Luvenia Starch

## 2018-12-21 ENCOUNTER — Ambulatory Visit
Admission: RE | Admit: 2018-12-21 | Discharge: 2018-12-21 | Disposition: A | Discharge status: Home / Self Care | Payer: BLUE CROSS/BLUE SHIELD | Source: Ambulatory Visit | Attending: Physician Assistant | Admitting: Physician Assistant

## 2018-12-21 ENCOUNTER — Other Ambulatory Visit: Payer: Self-pay

## 2018-12-21 DIAGNOSIS — R928 Other abnormal and inconclusive findings on diagnostic imaging of breast: Secondary | ICD-10-CM

## 2018-12-21 NOTE — Progress Notes (Signed)
Great news benign left breast cyst.  Alicia Davies please add to problem list.

## 2018-12-22 ENCOUNTER — Other Ambulatory Visit: Payer: BLUE CROSS/BLUE SHIELD

## 2018-12-25 DIAGNOSIS — M25552 Pain in left hip: Secondary | ICD-10-CM | POA: Diagnosis not present

## 2019-01-15 DIAGNOSIS — Z7901 Long term (current) use of anticoagulants: Secondary | ICD-10-CM | POA: Diagnosis not present

## 2019-01-15 DIAGNOSIS — E669 Obesity, unspecified: Secondary | ICD-10-CM | POA: Diagnosis not present

## 2019-01-15 DIAGNOSIS — F329 Major depressive disorder, single episode, unspecified: Secondary | ICD-10-CM | POA: Diagnosis not present

## 2019-01-15 DIAGNOSIS — I4891 Unspecified atrial fibrillation: Secondary | ICD-10-CM | POA: Diagnosis not present

## 2019-01-15 DIAGNOSIS — Z888 Allergy status to other drugs, medicaments and biological substances status: Secondary | ICD-10-CM | POA: Diagnosis not present

## 2019-01-15 DIAGNOSIS — Z79899 Other long term (current) drug therapy: Secondary | ICD-10-CM | POA: Diagnosis not present

## 2019-01-15 DIAGNOSIS — E7800 Pure hypercholesterolemia, unspecified: Secondary | ICD-10-CM | POA: Diagnosis not present

## 2019-01-15 DIAGNOSIS — I071 Rheumatic tricuspid insufficiency: Secondary | ICD-10-CM | POA: Diagnosis not present

## 2019-01-15 DIAGNOSIS — R002 Palpitations: Secondary | ICD-10-CM | POA: Diagnosis not present

## 2019-01-15 DIAGNOSIS — I129 Hypertensive chronic kidney disease with stage 1 through stage 4 chronic kidney disease, or unspecified chronic kidney disease: Secondary | ICD-10-CM | POA: Diagnosis not present

## 2019-01-15 DIAGNOSIS — G4733 Obstructive sleep apnea (adult) (pediatric): Secondary | ICD-10-CM | POA: Diagnosis not present

## 2019-01-15 DIAGNOSIS — N1832 Chronic kidney disease, stage 3b: Secondary | ICD-10-CM | POA: Diagnosis not present

## 2019-01-15 DIAGNOSIS — Z885 Allergy status to narcotic agent status: Secondary | ICD-10-CM | POA: Diagnosis not present

## 2019-01-15 DIAGNOSIS — Z9989 Dependence on other enabling machines and devices: Secondary | ICD-10-CM | POA: Diagnosis not present

## 2019-01-15 DIAGNOSIS — Z6841 Body Mass Index (BMI) 40.0 and over, adult: Secondary | ICD-10-CM | POA: Diagnosis not present

## 2019-01-15 HISTORY — PX: CARDIOVERSION: SHX1299

## 2019-01-15 LAB — LIPID PANEL
Cholesterol: 198 (ref 0–200)
HDL: 32 — AB (ref 35–70)
LDL Cholesterol: 132
Triglycerides: 170 — AB (ref 40–160)

## 2019-01-18 DIAGNOSIS — I4892 Unspecified atrial flutter: Secondary | ICD-10-CM | POA: Diagnosis not present

## 2019-01-18 DIAGNOSIS — I4891 Unspecified atrial fibrillation: Secondary | ICD-10-CM | POA: Diagnosis not present

## 2019-01-18 DIAGNOSIS — R001 Bradycardia, unspecified: Secondary | ICD-10-CM | POA: Diagnosis not present

## 2019-01-18 DIAGNOSIS — I10 Essential (primary) hypertension: Secondary | ICD-10-CM | POA: Diagnosis not present

## 2019-01-19 DIAGNOSIS — Z01818 Encounter for other preprocedural examination: Secondary | ICD-10-CM | POA: Diagnosis not present

## 2019-01-20 DIAGNOSIS — I4891 Unspecified atrial fibrillation: Secondary | ICD-10-CM | POA: Diagnosis not present

## 2019-01-20 DIAGNOSIS — I4892 Unspecified atrial flutter: Secondary | ICD-10-CM | POA: Diagnosis not present

## 2019-01-22 DIAGNOSIS — Z7901 Long term (current) use of anticoagulants: Secondary | ICD-10-CM | POA: Diagnosis not present

## 2019-01-22 DIAGNOSIS — M549 Dorsalgia, unspecified: Secondary | ICD-10-CM | POA: Diagnosis not present

## 2019-01-22 DIAGNOSIS — M1711 Unilateral primary osteoarthritis, right knee: Secondary | ICD-10-CM | POA: Diagnosis not present

## 2019-01-22 DIAGNOSIS — I129 Hypertensive chronic kidney disease with stage 1 through stage 4 chronic kidney disease, or unspecified chronic kidney disease: Secondary | ICD-10-CM | POA: Diagnosis not present

## 2019-01-22 DIAGNOSIS — N183 Chronic kidney disease, stage 3 unspecified: Secondary | ICD-10-CM | POA: Diagnosis not present

## 2019-01-22 DIAGNOSIS — R001 Bradycardia, unspecified: Secondary | ICD-10-CM | POA: Diagnosis not present

## 2019-01-22 DIAGNOSIS — I10 Essential (primary) hypertension: Secondary | ICD-10-CM | POA: Diagnosis not present

## 2019-01-22 DIAGNOSIS — M5136 Other intervertebral disc degeneration, lumbar region: Secondary | ICD-10-CM | POA: Diagnosis not present

## 2019-01-22 DIAGNOSIS — I4891 Unspecified atrial fibrillation: Secondary | ICD-10-CM | POA: Diagnosis not present

## 2019-01-22 DIAGNOSIS — E785 Hyperlipidemia, unspecified: Secondary | ICD-10-CM | POA: Diagnosis not present

## 2019-01-22 DIAGNOSIS — Z79899 Other long term (current) drug therapy: Secondary | ICD-10-CM | POA: Diagnosis not present

## 2019-01-22 DIAGNOSIS — Z6841 Body Mass Index (BMI) 40.0 and over, adult: Secondary | ICD-10-CM | POA: Diagnosis not present

## 2019-01-22 DIAGNOSIS — E7800 Pure hypercholesterolemia, unspecified: Secondary | ICD-10-CM | POA: Diagnosis not present

## 2019-01-22 DIAGNOSIS — I4892 Unspecified atrial flutter: Secondary | ICD-10-CM | POA: Diagnosis not present

## 2019-01-22 DIAGNOSIS — G4733 Obstructive sleep apnea (adult) (pediatric): Secondary | ICD-10-CM | POA: Diagnosis not present

## 2019-01-22 DIAGNOSIS — G8929 Other chronic pain: Secondary | ICD-10-CM | POA: Diagnosis not present

## 2019-02-05 DIAGNOSIS — I4892 Unspecified atrial flutter: Secondary | ICD-10-CM | POA: Diagnosis not present

## 2019-02-05 DIAGNOSIS — I4891 Unspecified atrial fibrillation: Secondary | ICD-10-CM | POA: Diagnosis not present

## 2019-02-05 DIAGNOSIS — I10 Essential (primary) hypertension: Secondary | ICD-10-CM | POA: Diagnosis not present

## 2019-02-05 DIAGNOSIS — R001 Bradycardia, unspecified: Secondary | ICD-10-CM | POA: Diagnosis not present

## 2019-02-05 DIAGNOSIS — G4733 Obstructive sleep apnea (adult) (pediatric): Secondary | ICD-10-CM | POA: Diagnosis not present

## 2019-02-09 DIAGNOSIS — M25562 Pain in left knee: Secondary | ICD-10-CM | POA: Diagnosis not present

## 2019-02-09 DIAGNOSIS — M25552 Pain in left hip: Secondary | ICD-10-CM | POA: Diagnosis not present

## 2019-02-26 DIAGNOSIS — Z6841 Body Mass Index (BMI) 40.0 and over, adult: Secondary | ICD-10-CM | POA: Diagnosis not present

## 2019-02-26 DIAGNOSIS — M1711 Unilateral primary osteoarthritis, right knee: Secondary | ICD-10-CM | POA: Diagnosis not present

## 2019-02-26 DIAGNOSIS — I10 Essential (primary) hypertension: Secondary | ICD-10-CM | POA: Diagnosis not present

## 2019-03-08 DIAGNOSIS — I4891 Unspecified atrial fibrillation: Secondary | ICD-10-CM | POA: Diagnosis not present

## 2019-03-08 DIAGNOSIS — G4733 Obstructive sleep apnea (adult) (pediatric): Secondary | ICD-10-CM | POA: Diagnosis not present

## 2019-03-08 DIAGNOSIS — I4892 Unspecified atrial flutter: Secondary | ICD-10-CM | POA: Diagnosis not present

## 2019-03-08 DIAGNOSIS — R001 Bradycardia, unspecified: Secondary | ICD-10-CM | POA: Diagnosis not present

## 2019-03-08 DIAGNOSIS — I10 Essential (primary) hypertension: Secondary | ICD-10-CM | POA: Diagnosis not present

## 2019-03-13 ENCOUNTER — Other Ambulatory Visit: Payer: Self-pay | Admitting: Physician Assistant

## 2019-03-13 DIAGNOSIS — I10 Essential (primary) hypertension: Secondary | ICD-10-CM

## 2019-03-15 NOTE — Telephone Encounter (Signed)
Needs appointment

## 2019-03-18 ENCOUNTER — Encounter: Payer: Self-pay | Admitting: Physician Assistant

## 2019-04-12 DIAGNOSIS — G4733 Obstructive sleep apnea (adult) (pediatric): Secondary | ICD-10-CM | POA: Diagnosis not present

## 2019-04-14 DIAGNOSIS — Z01810 Encounter for preprocedural cardiovascular examination: Secondary | ICD-10-CM | POA: Diagnosis not present

## 2019-04-14 DIAGNOSIS — Z20822 Contact with and (suspected) exposure to covid-19: Secondary | ICD-10-CM | POA: Diagnosis not present

## 2019-04-14 DIAGNOSIS — E041 Nontoxic single thyroid nodule: Secondary | ICD-10-CM | POA: Diagnosis not present

## 2019-04-14 DIAGNOSIS — N6320 Unspecified lump in the left breast, unspecified quadrant: Secondary | ICD-10-CM | POA: Diagnosis not present

## 2019-04-14 DIAGNOSIS — R918 Other nonspecific abnormal finding of lung field: Secondary | ICD-10-CM | POA: Diagnosis not present

## 2019-04-14 DIAGNOSIS — I4891 Unspecified atrial fibrillation: Secondary | ICD-10-CM | POA: Diagnosis not present

## 2019-04-14 DIAGNOSIS — K760 Fatty (change of) liver, not elsewhere classified: Secondary | ICD-10-CM | POA: Diagnosis not present

## 2019-04-14 DIAGNOSIS — I4892 Unspecified atrial flutter: Secondary | ICD-10-CM | POA: Diagnosis not present

## 2019-04-14 LAB — CBC AND DIFFERENTIAL
HCT: 42 (ref 36–46)
Hemoglobin: 13.3 (ref 12.0–16.0)
WBC: 7.4

## 2019-04-14 LAB — BASIC METABOLIC PANEL
BUN: 25 — AB (ref 4–21)
CO2: 24 — AB (ref 13–22)
Chloride: 102 (ref 99–108)
Creatinine: 1.2 — AB (ref <=1.1)
Glucose: 132
Potassium: 4.2 (ref 3.4–5.3)
Sodium: 136 — AB (ref 137–147)

## 2019-04-14 LAB — CBC: RBC: 4.19 (ref 3.87–5.11)

## 2019-04-14 LAB — COMPREHENSIVE METABOLIC PANEL
Calcium: 9.2 (ref 8.7–10.7)
GFR calc Af Amer: 56
GFR calc non Af Amer: 49

## 2019-04-21 DIAGNOSIS — M1711 Unilateral primary osteoarthritis, right knee: Secondary | ICD-10-CM | POA: Diagnosis not present

## 2019-04-21 DIAGNOSIS — Z79899 Other long term (current) drug therapy: Secondary | ICD-10-CM | POA: Diagnosis not present

## 2019-04-21 DIAGNOSIS — M2669 Other specified disorders of temporomandibular joint: Secondary | ICD-10-CM | POA: Diagnosis not present

## 2019-04-21 DIAGNOSIS — Z96652 Presence of left artificial knee joint: Secondary | ICD-10-CM | POA: Diagnosis not present

## 2019-04-21 DIAGNOSIS — I088 Other rheumatic multiple valve diseases: Secondary | ICD-10-CM | POA: Diagnosis not present

## 2019-04-21 DIAGNOSIS — Z9049 Acquired absence of other specified parts of digestive tract: Secondary | ICD-10-CM | POA: Diagnosis not present

## 2019-04-21 DIAGNOSIS — G4733 Obstructive sleep apnea (adult) (pediatric): Secondary | ICD-10-CM | POA: Diagnosis not present

## 2019-04-21 DIAGNOSIS — I480 Paroxysmal atrial fibrillation: Secondary | ICD-10-CM | POA: Diagnosis not present

## 2019-04-21 DIAGNOSIS — I081 Rheumatic disorders of both mitral and tricuspid valves: Secondary | ICD-10-CM | POA: Diagnosis not present

## 2019-04-21 DIAGNOSIS — Z7901 Long term (current) use of anticoagulants: Secondary | ICD-10-CM | POA: Diagnosis not present

## 2019-04-21 DIAGNOSIS — I4891 Unspecified atrial fibrillation: Secondary | ICD-10-CM | POA: Diagnosis not present

## 2019-04-21 DIAGNOSIS — M5136 Other intervertebral disc degeneration, lumbar region: Secondary | ICD-10-CM | POA: Diagnosis not present

## 2019-04-21 DIAGNOSIS — I129 Hypertensive chronic kidney disease with stage 1 through stage 4 chronic kidney disease, or unspecified chronic kidney disease: Secondary | ICD-10-CM | POA: Diagnosis not present

## 2019-04-21 DIAGNOSIS — I4892 Unspecified atrial flutter: Secondary | ICD-10-CM | POA: Diagnosis not present

## 2019-04-21 DIAGNOSIS — F329 Major depressive disorder, single episode, unspecified: Secondary | ICD-10-CM | POA: Diagnosis not present

## 2019-04-21 DIAGNOSIS — N183 Chronic kidney disease, stage 3 unspecified: Secondary | ICD-10-CM | POA: Diagnosis not present

## 2019-04-21 DIAGNOSIS — R001 Bradycardia, unspecified: Secondary | ICD-10-CM | POA: Diagnosis not present

## 2019-04-21 HISTORY — PX: ATRIAL FIBRILLATION ABLATION: EP1191

## 2019-04-22 ENCOUNTER — Telehealth: Payer: Self-pay | Admitting: Neurology

## 2019-04-22 DIAGNOSIS — G4733 Obstructive sleep apnea (adult) (pediatric): Secondary | ICD-10-CM | POA: Diagnosis not present

## 2019-04-22 DIAGNOSIS — I480 Paroxysmal atrial fibrillation: Secondary | ICD-10-CM | POA: Diagnosis not present

## 2019-04-22 DIAGNOSIS — F329 Major depressive disorder, single episode, unspecified: Secondary | ICD-10-CM | POA: Diagnosis not present

## 2019-04-22 DIAGNOSIS — M1711 Unilateral primary osteoarthritis, right knee: Secondary | ICD-10-CM | POA: Diagnosis not present

## 2019-04-22 DIAGNOSIS — M5136 Other intervertebral disc degeneration, lumbar region: Secondary | ICD-10-CM | POA: Diagnosis not present

## 2019-04-22 DIAGNOSIS — I4891 Unspecified atrial fibrillation: Secondary | ICD-10-CM | POA: Diagnosis not present

## 2019-04-22 DIAGNOSIS — I129 Hypertensive chronic kidney disease with stage 1 through stage 4 chronic kidney disease, or unspecified chronic kidney disease: Secondary | ICD-10-CM | POA: Diagnosis not present

## 2019-04-22 DIAGNOSIS — Z9049 Acquired absence of other specified parts of digestive tract: Secondary | ICD-10-CM | POA: Diagnosis not present

## 2019-04-22 DIAGNOSIS — Z79899 Other long term (current) drug therapy: Secondary | ICD-10-CM | POA: Diagnosis not present

## 2019-04-22 DIAGNOSIS — Z6841 Body Mass Index (BMI) 40.0 and over, adult: Secondary | ICD-10-CM | POA: Diagnosis not present

## 2019-04-22 DIAGNOSIS — I081 Rheumatic disorders of both mitral and tricuspid valves: Secondary | ICD-10-CM | POA: Diagnosis not present

## 2019-04-22 DIAGNOSIS — N183 Chronic kidney disease, stage 3 unspecified: Secondary | ICD-10-CM | POA: Diagnosis not present

## 2019-04-22 DIAGNOSIS — I4892 Unspecified atrial flutter: Secondary | ICD-10-CM | POA: Diagnosis not present

## 2019-04-22 DIAGNOSIS — R001 Bradycardia, unspecified: Secondary | ICD-10-CM | POA: Diagnosis not present

## 2019-04-22 DIAGNOSIS — Z96652 Presence of left artificial knee joint: Secondary | ICD-10-CM | POA: Diagnosis not present

## 2019-04-22 DIAGNOSIS — M2669 Other specified disorders of temporomandibular joint: Secondary | ICD-10-CM | POA: Diagnosis not present

## 2019-04-22 DIAGNOSIS — Z7901 Long term (current) use of anticoagulants: Secondary | ICD-10-CM | POA: Diagnosis not present

## 2019-04-22 NOTE — Telephone Encounter (Signed)
Patient left VM stating she has requested refill on Lasix and hasn't heard anything. Reviewing records it looks like the last time this was filled was 09/2017 #30 with no RF. I tried to call patient, and left vm stating if she needs medication again she will need to make follow up appt. She is to call back with any questions.

## 2019-04-23 DIAGNOSIS — I10 Essential (primary) hypertension: Secondary | ICD-10-CM | POA: Diagnosis not present

## 2019-04-23 DIAGNOSIS — G4730 Sleep apnea, unspecified: Secondary | ICD-10-CM | POA: Diagnosis not present

## 2019-04-23 DIAGNOSIS — I4891 Unspecified atrial fibrillation: Secondary | ICD-10-CM | POA: Diagnosis not present

## 2019-04-27 ENCOUNTER — Encounter: Payer: Self-pay | Admitting: Physician Assistant

## 2019-04-27 ENCOUNTER — Other Ambulatory Visit: Payer: Self-pay

## 2019-04-27 ENCOUNTER — Ambulatory Visit (INDEPENDENT_AMBULATORY_CARE_PROVIDER_SITE_OTHER): Payer: BC Managed Care – PPO

## 2019-04-27 ENCOUNTER — Ambulatory Visit (INDEPENDENT_AMBULATORY_CARE_PROVIDER_SITE_OTHER): Payer: BC Managed Care – PPO | Admitting: Physician Assistant

## 2019-04-27 VITALS — BP 133/64 | HR 60 | Ht 69.0 in | Wt 330.0 lb

## 2019-04-27 DIAGNOSIS — R600 Localized edema: Secondary | ICD-10-CM

## 2019-04-27 DIAGNOSIS — Z79899 Other long term (current) drug therapy: Secondary | ICD-10-CM | POA: Diagnosis not present

## 2019-04-27 DIAGNOSIS — N6002 Solitary cyst of left breast: Secondary | ICD-10-CM

## 2019-04-27 DIAGNOSIS — E042 Nontoxic multinodular goiter: Secondary | ICD-10-CM | POA: Diagnosis not present

## 2019-04-27 DIAGNOSIS — I10 Essential (primary) hypertension: Secondary | ICD-10-CM | POA: Diagnosis not present

## 2019-04-27 DIAGNOSIS — E041 Nontoxic single thyroid nodule: Secondary | ICD-10-CM

## 2019-04-27 DIAGNOSIS — F3342 Major depressive disorder, recurrent, in full remission: Secondary | ICD-10-CM

## 2019-04-27 DIAGNOSIS — I4891 Unspecified atrial fibrillation: Secondary | ICD-10-CM

## 2019-04-27 DIAGNOSIS — N1831 Chronic kidney disease, stage 3a: Secondary | ICD-10-CM

## 2019-04-27 MED ORDER — FUROSEMIDE 40 MG PO TABS: 40.0000 mg | ORAL_TABLET | Freq: Every day | ORAL | 1 refills | Status: DC

## 2019-04-27 NOTE — Progress Notes (Signed)
Subjective:    Patient ID: Alicia Davies, female    DOB: [birth year], 64 y.o.   MRN: [id number]  HPI  Patient is a 64 year old obese female with atrial fibrillation, hypertension, OSA, CKD 3 who presents to the clinic for follow up.   On 4/7 she had ablation of SA node. She is doing well. On xarelto, amiodarone, diltiazem. Followed by cardiology. She does have some extensive bruising from procedure.   CT done of neck questions 2cm  right thyroid nodule and left 39mm breast nodule.   She conitnues to have some bilateral lower ext edema. On lasix 20mg  daily. Not moving much after procedure. No concerning SOb.   Her mood is good. Doing well on zoloft. No SI/HC.    . Active Ambulatory Problems    Diagnosis Date Noted  . B12 DEFICIENCY 12/11/2009  . UNSPECIFIED VITAMIN D DEFICIENCY 12/05/2009  . HYPERCALCEMIA 12/06/2009  . MENOPAUSAL SYNDROME 12/05/2009  . FATIGUE 12/05/2009  . Primary hyperparathyroidism (HCC) 01/29/2010  . POSTMENOPAUSAL STATUS 01/17/2010  . Benign cyst of left breast 10/28/2012  . Depression 07/21/2013  . Essential hypertension, benign 07/21/2013  . Hyperlipidemia 07/21/2013  . OSA (obstructive sleep apnea) 09/29/2013  . S/P right knee arthroscopy 02/28/2014  . Hyperglycemia 06/10/2014  . Seborrheic keratoses 07/14/2015  . Morbid obesity (HCC) 07/14/2015  . CKD (chronic kidney disease) stage 3, GFR 30-59 ml/min 10/15/2015  . DDD (degenerative disc disease), lumbar 09/05/2016  . Chronic pain 12/02/2016  . Atrial flutter with rapid ventricular response (HCC) 04/07/2017  . Myalgia due to statin 06/11/2017  . Sinus bradycardia 12/03/2017  . TMJ (temporomandibular joint syndrome) 05/14/2018  . New daily persistent headache 11/02/2018  . No energy 11/02/2018  . Lower extremity edema 04/28/2019  . Right thyroid nodule 04/28/2019   Resolved Ambulatory Problems    Diagnosis Date Noted  . Obesity, unspecified 12/05/2009  . Right knee pain 08/24/2010  . Severe  obstructive sleep apnea 10/12/2012  . Severe obesity (BMI >= 40) (HCC) 07/21/2013  . Swelling of lower extremity 07/21/2013  . Muscle spasm 03/22/2014  . Piriformis syndrome of left side 07/14/2015  . Viral upper respiratory infection 01/26/2016   Past Medical History:  Diagnosis Date  . Hyperparathyroidism   . Kidney stones   . Paroxysmal atrial flutter (HCC) 03/2017     Review of Systems See HPI.     Objective:   Physical Exam Vitals reviewed.  Constitutional:      Appearance: Normal appearance. She is obese.  HENT:     Head: Normocephalic.  Neck:     Vascular: No carotid bruit.  Cardiovascular:     Rate and Rhythm: Normal rate and regular rhythm.     Pulses: Normal pulses.  Pulmonary:     Effort: Pulmonary effort is normal.     Breath sounds: Normal breath sounds.  Musculoskeletal:     Right lower leg: Edema present.     Left lower leg: Edema present.     Comments: Bilateral non pitting ankle edema.   Skin:    Comments: Extensive bruising over left upper leg and into suprpubic space and left lower quadrant.   Neurological:     General: No focal deficit present.     Mental Status: She is alert and oriented to person, place, and time.  Psychiatric:        Mood and Affect: Mood normal.      .. Depression screen Alicia Davies 2/9 04/27/2019 10/30/2018 05/29/2018 05/14/2018 12/03/2017  Decreased Interest 0 0 0  0 0  Down, Depressed, Hopeless 0 0 0 0 0  PHQ - 2 Score 0 0 0 0 0  Altered sleeping 1 3 0 0 1  Tired, decreased energy 1 3 0 0 1  Change in appetite 1 0 1 1 1   Feeling bad or failure about yourself  0 0 0 0 0  Trouble concentrating 0 0 0 0 0  Moving slowly or fidgety/restless 0 0 0 0 0  Suicidal thoughts 0 0 0 0 0  PHQ-9 Score 3 6 1 1 3   Difficult doing work/chores Not difficult at all Not difficult at all Not difficult at all Not difficult at all Not difficult at all   .[name] GAD 7 : Generalized Anxiety Score 04/27/2019 10/30/2018 05/29/2018 05/14/2018  Nervous,  Anxious, on Edge 0 0 0 0  Control/stop worrying 0 0 0 0  Worry too much - different things 1 0 0 0  Trouble relaxing 0 0 0 0  Restless 0 0 0 0  Easily annoyed or irritable 0 0 1 0  Afraid - awful might happen 0 0 0 0  Total GAD 7 Score 1 0 1 0  Anxiety Difficulty Not difficult at all Not difficult at all Not difficult at all Not difficult at all         Assessment & Plan:  Marland KitchenMarland KitchenToccara was seen today for follow-up.  Diagnoses and all orders for this visit:  Lower extremity edema -     furosemide (LASIX) 40 MG tablet; Take 1 tablet (40 mg total) by mouth daily.  Medication management -     BASIC METABOLIC PANEL WITH GFR  Right thyroid nodule -     US THYROID  Essential hypertension, benign -     lisinopril (ZESTRIL) 5 MG tablet; Take 1 tablet (5 mg total) by mouth daily.  Atrial fibrillation with RVR (HCC)  Stage 3a chronic kidney disease  Benign cyst of left breast  Recurrent major depressive disorder, in full remission (HCC) -     sertraline (ZOLOFT) 50 MG tablet; Take 1 tablet (50 mg total) by mouth daily.   Continues to be managed by cardiology after ablation. On Xarelto.  BP to goal. Lisinopril refilled.   Continues to have some lower leg edema bilaterally. SCr 1.20 stable. Increase lasix to 40mg  for next week or so.  Will abstract labs from care everywhere.  Recheck kidney function(gave labslip) in 2 weeks.  If swelling improves a lot cut back to 1/2 tablet.  Consider compression stockings and keeping feet elevated.   Mood doing ok. Medications refilled today.   Will get thyroid ultrasound to evaluate 2cm nodule found on CT.   Pt had additional imaging done on breast and correlates to CT findings. No need for follow up at this time.   Follow up in 2 months.

## 2019-04-27 NOTE — Patient Instructions (Signed)
Edema  Edema is when you have too much fluid in your body or under your skin. Edema may make your legs, feet, and ankles swell up. Swelling is also common in looser tissues, like around your eyes. This is a common condition. It gets more common as you get older. There are many possible causes of edema. Eating too much salt (sodium) and being on your feet or sitting for a long time can cause edema in your legs, feet, and ankles. Hot weather may make edema worse. Edema is usually painless. Your skin may look swollen or shiny. Follow these instructions at home:  Keep the swollen body part raised (elevated) above the level of your heart when you are sitting or lying down.  Do not sit still or stand for a long time.  Do not wear tight clothes. Do not wear garters on your upper legs.  Exercise your legs. This can help the swelling go down.  Wear elastic bandages or support stockings as told by your doctor.  Eat a low-salt (low-sodium) diet to reduce fluid as told by your doctor.  Depending on the cause of your swelling, you may need to limit how much fluid you drink (fluid restriction).  Take over-the-counter and prescription medicines only as told by your doctor. Contact a doctor if:  Treatment is not working.  You have heart, liver, or kidney disease and have symptoms of edema.  You have sudden and unexplained weight gain. Get help right away if:  You have shortness of breath or chest pain.  You cannot breathe when you lie down.  You have pain, redness, or warmth in the swollen areas.  You have heart, liver, or kidney disease and get edema all of a sudden.  You have a fever and your symptoms get worse all of a sudden. Summary  Edema is when you have too much fluid in your body or under your skin.  Edema may make your legs, feet, and ankles swell up. Swelling is also common in looser tissues, like around your eyes.  Raise (elevate) the swollen body part above the level of your  heart when you are sitting or lying down.  Follow your doctor's instructions about diet and how much fluid you can drink (fluid restriction). This information is not intended to replace advice given to you by your health care provider. Make sure you discuss any questions you have with your health care provider. Document Revised: 01/03/2017 Document Reviewed: 01/19/2016 Elsevier Patient Education  2020 [employer]  

## 2019-04-28 ENCOUNTER — Telehealth: Payer: Self-pay | Admitting: Physician Assistant

## 2019-04-28 ENCOUNTER — Encounter: Payer: Self-pay | Admitting: Physician Assistant

## 2019-04-28 DIAGNOSIS — R600 Localized edema: Secondary | ICD-10-CM | POA: Insufficient documentation

## 2019-04-28 DIAGNOSIS — E041 Nontoxic single thyroid nodule: Secondary | ICD-10-CM | POA: Insufficient documentation

## 2019-04-28 MED ORDER — LISINOPRIL 5 MG PO TABS: 5.0000 mg | ORAL_TABLET | Freq: Every day | ORAL | 3 refills | Status: AC

## 2019-04-28 MED ORDER — SERTRALINE HCL 50 MG PO TABS: 50.0000 mg | ORAL_TABLET | Freq: Every day | ORAL | 3 refills | Status: AC

## 2019-04-28 NOTE — Telephone Encounter (Signed)
Can we abstract cmp and lipid from careeverywhere

## 2019-04-28 NOTE — Telephone Encounter (Signed)
Abstracted

## 2019-04-28 NOTE — Progress Notes (Signed)
Waleska,   You do have some nodules and cysts within the right thyroid but none of them meet biopsy criteria or follow up. GREAT news.   -Lesly Rubenstein

## 2019-05-01 DIAGNOSIS — N189 Chronic kidney disease, unspecified: Secondary | ICD-10-CM | POA: Diagnosis not present

## 2019-05-01 DIAGNOSIS — Z8679 Personal history of other diseases of the circulatory system: Secondary | ICD-10-CM | POA: Diagnosis not present

## 2019-05-01 DIAGNOSIS — M1990 Unspecified osteoarthritis, unspecified site: Secondary | ICD-10-CM | POA: Diagnosis not present

## 2019-05-01 DIAGNOSIS — Z79899 Other long term (current) drug therapy: Secondary | ICD-10-CM | POA: Diagnosis not present

## 2019-05-01 DIAGNOSIS — L7632 Postprocedural hematoma of skin and subcutaneous tissue following other procedure: Secondary | ICD-10-CM | POA: Diagnosis not present

## 2019-05-01 DIAGNOSIS — I129 Hypertensive chronic kidney disease with stage 1 through stage 4 chronic kidney disease, or unspecified chronic kidney disease: Secondary | ICD-10-CM | POA: Diagnosis not present

## 2019-05-01 DIAGNOSIS — Z888 Allergy status to other drugs, medicaments and biological substances status: Secondary | ICD-10-CM | POA: Diagnosis not present

## 2019-05-01 DIAGNOSIS — Z9989 Dependence on other enabling machines and devices: Secondary | ICD-10-CM | POA: Diagnosis not present

## 2019-05-01 DIAGNOSIS — Z87442 Personal history of urinary calculi: Secondary | ICD-10-CM | POA: Diagnosis not present

## 2019-05-01 DIAGNOSIS — R2242 Localized swelling, mass and lump, left lower limb: Secondary | ICD-10-CM | POA: Diagnosis not present

## 2019-05-01 DIAGNOSIS — S301XXA Contusion of abdominal wall, initial encounter: Secondary | ICD-10-CM | POA: Diagnosis not present

## 2019-05-01 DIAGNOSIS — Z885 Allergy status to narcotic agent status: Secondary | ICD-10-CM | POA: Diagnosis not present

## 2019-05-01 DIAGNOSIS — E7800 Pure hypercholesterolemia, unspecified: Secondary | ICD-10-CM | POA: Diagnosis not present

## 2019-05-01 DIAGNOSIS — Z7901 Long term (current) use of anticoagulants: Secondary | ICD-10-CM | POA: Diagnosis not present

## 2019-05-01 DIAGNOSIS — G4733 Obstructive sleep apnea (adult) (pediatric): Secondary | ICD-10-CM | POA: Diagnosis not present

## 2019-05-10 DIAGNOSIS — I10 Essential (primary) hypertension: Secondary | ICD-10-CM | POA: Diagnosis not present

## 2019-05-10 DIAGNOSIS — R0602 Shortness of breath: Secondary | ICD-10-CM | POA: Diagnosis not present

## 2019-05-10 DIAGNOSIS — I4891 Unspecified atrial fibrillation: Secondary | ICD-10-CM | POA: Diagnosis not present

## 2019-05-10 DIAGNOSIS — R001 Bradycardia, unspecified: Secondary | ICD-10-CM | POA: Diagnosis not present

## 2019-05-10 DIAGNOSIS — I4892 Unspecified atrial flutter: Secondary | ICD-10-CM | POA: Diagnosis not present

## 2019-05-12 DIAGNOSIS — H2513 Age-related nuclear cataract, bilateral: Secondary | ICD-10-CM | POA: Diagnosis not present

## 2019-05-12 DIAGNOSIS — H5213 Myopia, bilateral: Secondary | ICD-10-CM | POA: Diagnosis not present

## 2019-05-12 DIAGNOSIS — Z723 Lack of physical exercise: Secondary | ICD-10-CM | POA: Diagnosis not present

## 2019-05-13 ENCOUNTER — Other Ambulatory Visit: Payer: Self-pay | Admitting: Physician Assistant

## 2019-05-13 DIAGNOSIS — G4452 New daily persistent headache (NDPH): Secondary | ICD-10-CM

## 2019-05-19 DIAGNOSIS — K580 Irritable bowel syndrome with diarrhea: Secondary | ICD-10-CM | POA: Diagnosis not present

## 2019-05-19 DIAGNOSIS — Z9049 Acquired absence of other specified parts of digestive tract: Secondary | ICD-10-CM | POA: Diagnosis not present

## 2019-05-19 DIAGNOSIS — R152 Fecal urgency: Secondary | ICD-10-CM | POA: Diagnosis not present

## 2019-05-19 DIAGNOSIS — R159 Full incontinence of feces: Secondary | ICD-10-CM | POA: Diagnosis not present

## 2019-05-25 DIAGNOSIS — Z9989 Dependence on other enabling machines and devices: Secondary | ICD-10-CM | POA: Diagnosis not present

## 2019-05-25 DIAGNOSIS — I4892 Unspecified atrial flutter: Secondary | ICD-10-CM | POA: Diagnosis not present

## 2019-05-25 DIAGNOSIS — G4733 Obstructive sleep apnea (adult) (pediatric): Secondary | ICD-10-CM | POA: Diagnosis not present

## 2019-05-26 DIAGNOSIS — I10 Essential (primary) hypertension: Secondary | ICD-10-CM | POA: Diagnosis not present

## 2019-05-26 DIAGNOSIS — E559 Vitamin D deficiency, unspecified: Secondary | ICD-10-CM | POA: Diagnosis not present

## 2019-05-26 DIAGNOSIS — I4892 Unspecified atrial flutter: Secondary | ICD-10-CM | POA: Diagnosis not present

## 2019-05-26 DIAGNOSIS — Z6841 Body Mass Index (BMI) 40.0 and over, adult: Secondary | ICD-10-CM | POA: Diagnosis not present

## 2019-05-26 DIAGNOSIS — R5382 Chronic fatigue, unspecified: Secondary | ICD-10-CM | POA: Diagnosis not present

## 2019-05-26 DIAGNOSIS — I4891 Unspecified atrial fibrillation: Secondary | ICD-10-CM | POA: Diagnosis not present

## 2019-05-26 DIAGNOSIS — E041 Nontoxic single thyroid nodule: Secondary | ICD-10-CM | POA: Diagnosis not present

## 2019-05-27 ENCOUNTER — Encounter: Payer: Self-pay | Admitting: Physician Assistant

## 2019-05-27 DIAGNOSIS — G4733 Obstructive sleep apnea (adult) (pediatric): Secondary | ICD-10-CM | POA: Diagnosis not present

## 2019-05-28 ENCOUNTER — Encounter: Payer: Self-pay | Admitting: Physician Assistant

## 2019-05-28 NOTE — Telephone Encounter (Signed)
Routing to covering provider.  °

## 2019-05-28 NOTE — Telephone Encounter (Signed)
She will need to schedule a visit to discuss the results of the labs. I can see in the bariatric clinic note that they did perform labs, but the ferritin level is not listed, therefore I cannot adequately evaluate. Alicia Davies may be able to do a virtual visit with her next week.

## 2019-05-28 NOTE — Telephone Encounter (Signed)
Routing to covering provider. Pt was last seen in office on 04/27/19.

## 2019-05-31 DIAGNOSIS — M461 Sacroiliitis, not elsewhere classified: Secondary | ICD-10-CM | POA: Diagnosis not present

## 2019-06-02 DIAGNOSIS — I129 Hypertensive chronic kidney disease with stage 1 through stage 4 chronic kidney disease, or unspecified chronic kidney disease: Secondary | ICD-10-CM | POA: Diagnosis not present

## 2019-06-02 DIAGNOSIS — Z881 Allergy status to other antibiotic agents status: Secondary | ICD-10-CM | POA: Diagnosis not present

## 2019-06-02 DIAGNOSIS — Z6841 Body Mass Index (BMI) 40.0 and over, adult: Secondary | ICD-10-CM | POA: Diagnosis not present

## 2019-06-02 DIAGNOSIS — I4892 Unspecified atrial flutter: Secondary | ICD-10-CM | POA: Diagnosis not present

## 2019-06-02 DIAGNOSIS — Z96653 Presence of artificial knee joint, bilateral: Secondary | ICD-10-CM | POA: Diagnosis not present

## 2019-06-02 DIAGNOSIS — F329 Major depressive disorder, single episode, unspecified: Secondary | ICD-10-CM | POA: Diagnosis not present

## 2019-06-02 DIAGNOSIS — I4891 Unspecified atrial fibrillation: Secondary | ICD-10-CM | POA: Diagnosis not present

## 2019-06-02 DIAGNOSIS — E785 Hyperlipidemia, unspecified: Secondary | ICD-10-CM | POA: Diagnosis not present

## 2019-06-02 DIAGNOSIS — G4733 Obstructive sleep apnea (adult) (pediatric): Secondary | ICD-10-CM | POA: Diagnosis not present

## 2019-06-02 DIAGNOSIS — N183 Chronic kidney disease, stage 3 unspecified: Secondary | ICD-10-CM | POA: Diagnosis not present

## 2019-06-02 DIAGNOSIS — I10 Essential (primary) hypertension: Secondary | ICD-10-CM | POA: Diagnosis not present

## 2019-06-02 DIAGNOSIS — M5136 Other intervertebral disc degeneration, lumbar region: Secondary | ICD-10-CM | POA: Diagnosis not present

## 2019-06-02 DIAGNOSIS — Z9049 Acquired absence of other specified parts of digestive tract: Secondary | ICD-10-CM | POA: Diagnosis not present

## 2019-06-02 DIAGNOSIS — K3189 Other diseases of stomach and duodenum: Secondary | ICD-10-CM | POA: Diagnosis not present

## 2019-06-02 DIAGNOSIS — Z87442 Personal history of urinary calculi: Secondary | ICD-10-CM | POA: Diagnosis not present

## 2019-06-02 DIAGNOSIS — Z885 Allergy status to narcotic agent status: Secondary | ICD-10-CM | POA: Diagnosis not present

## 2019-06-02 DIAGNOSIS — Z7901 Long term (current) use of anticoagulants: Secondary | ICD-10-CM | POA: Diagnosis not present

## 2019-06-02 DIAGNOSIS — K219 Gastro-esophageal reflux disease without esophagitis: Secondary | ICD-10-CM | POA: Diagnosis not present

## 2019-06-03 DIAGNOSIS — Z713 Dietary counseling and surveillance: Secondary | ICD-10-CM | POA: Diagnosis not present

## 2019-06-03 DIAGNOSIS — Z6841 Body Mass Index (BMI) 40.0 and over, adult: Secondary | ICD-10-CM | POA: Diagnosis not present

## 2019-06-03 DIAGNOSIS — N183 Chronic kidney disease, stage 3 unspecified: Secondary | ICD-10-CM | POA: Diagnosis not present

## 2019-06-04 DIAGNOSIS — Z7189 Other specified counseling: Secondary | ICD-10-CM | POA: Diagnosis not present

## 2019-06-08 DIAGNOSIS — I10 Essential (primary) hypertension: Secondary | ICD-10-CM | POA: Diagnosis not present

## 2019-06-08 DIAGNOSIS — N183 Chronic kidney disease, stage 3 unspecified: Secondary | ICD-10-CM | POA: Diagnosis not present

## 2019-06-08 DIAGNOSIS — G4730 Sleep apnea, unspecified: Secondary | ICD-10-CM | POA: Diagnosis not present

## 2019-06-28 DIAGNOSIS — G4733 Obstructive sleep apnea (adult) (pediatric): Secondary | ICD-10-CM | POA: Diagnosis not present

## 2019-07-01 ENCOUNTER — Encounter: Payer: Self-pay | Admitting: Physician Assistant

## 2019-07-02 NOTE — Telephone Encounter (Signed)
Can you work on this when you have the chance?

## 2019-07-05 ENCOUNTER — Other Ambulatory Visit: Payer: Self-pay | Admitting: Neurology

## 2019-07-05 DIAGNOSIS — R600 Localized edema: Secondary | ICD-10-CM

## 2019-07-05 MED ORDER — FUROSEMIDE 40 MG PO TABS: 40.0000 mg | ORAL_TABLET | Freq: Every day | ORAL | 0 refills | Status: DC

## 2019-07-22 DIAGNOSIS — I4892 Unspecified atrial flutter: Secondary | ICD-10-CM | POA: Diagnosis not present

## 2019-07-22 DIAGNOSIS — R001 Bradycardia, unspecified: Secondary | ICD-10-CM | POA: Diagnosis not present

## 2019-07-22 DIAGNOSIS — I10 Essential (primary) hypertension: Secondary | ICD-10-CM | POA: Diagnosis not present

## 2019-07-22 DIAGNOSIS — G4733 Obstructive sleep apnea (adult) (pediatric): Secondary | ICD-10-CM | POA: Diagnosis not present

## 2019-07-22 DIAGNOSIS — I4891 Unspecified atrial fibrillation: Secondary | ICD-10-CM | POA: Diagnosis not present

## 2019-07-26 DIAGNOSIS — G4733 Obstructive sleep apnea (adult) (pediatric): Secondary | ICD-10-CM | POA: Diagnosis not present

## 2019-07-26 DIAGNOSIS — I10 Essential (primary) hypertension: Secondary | ICD-10-CM | POA: Diagnosis not present

## 2019-07-26 DIAGNOSIS — E559 Vitamin D deficiency, unspecified: Secondary | ICD-10-CM | POA: Diagnosis not present

## 2019-07-26 DIAGNOSIS — Z6841 Body Mass Index (BMI) 40.0 and over, adult: Secondary | ICD-10-CM | POA: Diagnosis not present

## 2019-07-29 DIAGNOSIS — G4733 Obstructive sleep apnea (adult) (pediatric): Secondary | ICD-10-CM | POA: Diagnosis not present

## 2019-08-03 ENCOUNTER — Other Ambulatory Visit: Payer: Self-pay | Admitting: Neurology

## 2019-08-03 DIAGNOSIS — Z713 Dietary counseling and surveillance: Secondary | ICD-10-CM | POA: Diagnosis not present

## 2019-08-03 DIAGNOSIS — R600 Localized edema: Secondary | ICD-10-CM

## 2019-08-03 DIAGNOSIS — Z6841 Body Mass Index (BMI) 40.0 and over, adult: Secondary | ICD-10-CM | POA: Diagnosis not present

## 2019-08-03 MED ORDER — FUROSEMIDE 40 MG PO TABS: 40.0000 mg | ORAL_TABLET | Freq: Every day | ORAL | 0 refills | Status: AC

## 2019-08-12 ENCOUNTER — Other Ambulatory Visit: Payer: Self-pay | Admitting: Neurology

## 2019-08-12 DIAGNOSIS — G4452 New daily persistent headache (NDPH): Secondary | ICD-10-CM

## 2019-08-12 MED ORDER — BACLOFEN 10 MG PO TABS: ORAL_TABLET | ORAL | 0 refills | Status: DC

## 2019-08-19 DIAGNOSIS — N183 Chronic kidney disease, stage 3 unspecified: Secondary | ICD-10-CM | POA: Diagnosis not present

## 2019-08-19 DIAGNOSIS — M1711 Unilateral primary osteoarthritis, right knee: Secondary | ICD-10-CM | POA: Diagnosis not present

## 2019-08-19 DIAGNOSIS — I10 Essential (primary) hypertension: Secondary | ICD-10-CM | POA: Diagnosis not present

## 2019-08-23 DIAGNOSIS — Z7189 Other specified counseling: Secondary | ICD-10-CM | POA: Diagnosis not present

## 2019-09-19 ENCOUNTER — Other Ambulatory Visit: Payer: Self-pay | Admitting: Physician Assistant

## 2019-09-19 DIAGNOSIS — G4452 New daily persistent headache (NDPH): Secondary | ICD-10-CM

## 2019-09-30 DIAGNOSIS — N183 Chronic kidney disease, stage 3 unspecified: Secondary | ICD-10-CM | POA: Diagnosis not present

## 2019-09-30 DIAGNOSIS — G4733 Obstructive sleep apnea (adult) (pediatric): Secondary | ICD-10-CM | POA: Diagnosis not present

## 2019-09-30 DIAGNOSIS — I10 Essential (primary) hypertension: Secondary | ICD-10-CM | POA: Diagnosis not present

## 2019-09-30 DIAGNOSIS — M1711 Unilateral primary osteoarthritis, right knee: Secondary | ICD-10-CM | POA: Diagnosis not present

## 2019-10-11 ENCOUNTER — Telehealth: Payer: Self-pay | Admitting: Neurology

## 2019-10-11 NOTE — Telephone Encounter (Signed)
Patient left vm stating she is having Bariatric surgery soon and needs some documentation from [facility]. To call her back at [phone].

## 2019-10-11 NOTE — Telephone Encounter (Signed)
Spoke with patient and she needs documentation from our office where she has discussed weight loss in the last year. Patient has only been seen once in the last year and weight loss not discussed. Has had a history in 2017-2018 where she was prescribed weight loss medication. Appt made to discuss and fill out forms.

## 2019-10-13 ENCOUNTER — Ambulatory Visit (INDEPENDENT_AMBULATORY_CARE_PROVIDER_SITE_OTHER): Payer: BLUE CROSS/BLUE SHIELD | Admitting: Physician Assistant

## 2019-10-13 DIAGNOSIS — Z23 Encounter for immunization: Secondary | ICD-10-CM | POA: Diagnosis not present

## 2019-10-13 DIAGNOSIS — M545 Low back pain, unspecified: Secondary | ICD-10-CM

## 2019-10-13 DIAGNOSIS — L03012 Cellulitis of left finger: Secondary | ICD-10-CM

## 2019-10-13 DIAGNOSIS — M25562 Pain in left knee: Secondary | ICD-10-CM

## 2019-10-13 DIAGNOSIS — R7301 Impaired fasting glucose: Secondary | ICD-10-CM

## 2019-10-13 DIAGNOSIS — G8929 Other chronic pain: Secondary | ICD-10-CM

## 2019-10-13 DIAGNOSIS — M25561 Pain in right knee: Secondary | ICD-10-CM

## 2019-10-13 LAB — POCT GLYCOSYLATED HEMOGLOBIN (HGB A1C): Hemoglobin A1C: 5.4 % (ref 4.0–5.6)

## 2019-10-13 NOTE — Progress Notes (Addendum)
Subjective:    Patient ID: Alicia Davies, female    DOB: 19-Apr-1955, 64 y.o.   MRN: [id number]  HPI  [name] is a pleasant 64 year old female suffering from mornid obesity, Hypertension, obstructive sleep apnea, fatigue, elevated fasting glucose, hyperlipidemia, chronic pain and lower extremity edema.   [name] is here to discuss bariatric surgery.   In the past she has tried multiple lifestyle, diet and pharmaceuticals to help her lose weight. These include Phentermine, Belviq , and Saxenda. All of these did not result in sustained long term weight loss   Phentermine tried and failed September October, November Dec of 2017 Failed again April, August and november of 2018. She was on this for a total of 7 moths without sufficient weight loss changes.   She tried Belviq 02/02/16-04/07/17 without sufficient weight loss changes  She tried Saxenda 09/02/16-12/01/16 without sufficient weight loss changes  More active now and has lost 30-40lbs since January of 2021 and states she is feeling much better but is still having health problems related to her weight. She has difficulty exercising vigorously due to bilateral knee replacements that are still painful and degenerative disk disease which limits her activity level.    Also perionychia of the middle phalange of the left hand. This started about 2 weeks ago. It has come to a head and ruptured twice but she feels like it is coming back.   Patient also still suffers from the following .[name] Active Ambulatory Problems    Diagnosis Date Noted  . B12 DEFICIENCY 12/11/2009  . UNSPECIFIED VITAMIN D DEFICIENCY 12/05/2009  . HYPERCALCEMIA 12/06/2009  . MENOPAUSAL SYNDROME 12/05/2009  . FATIGUE 12/05/2009  . Primary hyperparathyroidism (HCC) 01/29/2010  . POSTMENOPAUSAL STATUS 01/17/2010  . Benign cyst of left breast 10/28/2012  . Depression 07/21/2013  . Essential hypertension, benign 07/21/2013  . Hyperlipidemia 07/21/2013  . OSA (obstructive sleep  apnea) 09/29/2013  . S/P right knee arthroscopy 02/28/2014  . Elevated fasting glucose 06/10/2014  . Seborrheic keratoses 07/14/2015  . Morbid obesity (HCC) 07/14/2015  . CKD (chronic kidney disease) stage 3, GFR 30-59 ml/min (HCC) 10/15/2015  . DDD (degenerative disc disease), lumbar 09/05/2016  . Chronic pain 12/02/2016  . Atrial flutter with rapid ventricular response (HCC) 04/07/2017  . Myalgia due to statin 06/11/2017  . Sinus bradycardia 12/03/2017  . TMJ (temporomandibular joint syndrome) 05/14/2018  . New daily persistent headache 11/02/2018  . No energy 11/02/2018  . Lower extremity edema 04/28/2019  . Right thyroid nodule 04/28/2019  . Chronic bilateral low back pain without sciatica 10/15/2019  . Chronic pain of both knees 10/15/2019   Resolved Ambulatory Problems    Diagnosis Date Noted  . Obesity, unspecified 12/05/2009  . Right knee pain 08/24/2010  . Severe obstructive sleep apnea 10/12/2012  . Severe obesity (BMI >= 40) (HCC) 07/21/2013  . Swelling of lower extremity 07/21/2013  . Muscle spasm 03/22/2014  . Piriformis syndrome of left side 07/14/2015  . Viral upper respiratory infection 01/26/2016   Past Medical History:  Diagnosis Date  . Hyperparathyroidism   . Kidney stones   . Paroxysmal atrial flutter (HCC) 03/2017    Review of Systems  Constitutional: Negative for chills and fever.  Respiratory: Negative for cough, chest tightness and shortness of breath.   Cardiovascular: Negative for chest pain and palpitations.  Gastrointestinal: Negative for abdominal pain, constipation and diarrhea.   .     Objective:   Physical Exam Constitutional:      Appearance: Normal appearance. She  is obese.  Cardiovascular:     Rate and Rhythm: Regular rhythm.     Pulses: Normal pulses.     Heart sounds: Normal heart sounds.  Pulmonary:     Effort: Pulmonary effort is normal.     Breath sounds: Normal breath sounds.  Musculoskeletal:     Right hand: Normal.      Left hand: Swelling present.     Comments: Perionychia of the L middle phalange    Neurological:     Mental Status: She is alert.    .. Lab Results  Component Value Date   HGBA1C 5.4 10/13/2019       Assessment & Plan:  Marland KitchenMarland KitchenJamesetta was seen today for obesity.  Diagnoses and all orders for this visit:  Morbid obesity (HCC)  Flu vaccine need -     Flu Vaccine QUAD 36+ mos IM  Paronychia of finger of left hand  Chronic pain of both knees  Chronic bilateral low back pain without sciatica  Elevated fasting glucose -     POCT glycosylated hemoglobin (Hb A1C)  Need for Tdap vaccination -     Tdap vaccine greater than or equal to 7yo IM   -Patient would benefit greatly from bariatric surgery resulting in sustained weight loss. It is in my opinion that she is healthy enough for surgery and that her above mentioned health problems would improve as a result.  -she does need to be cleared by cardiology.  -she will need to stop xaralto 2-3 days before surgery and resume day after surgery.  -A1C was great today.   Discussed paronychia and expressed some pus from cuticle from open abscess. epson salt water soaks and topical bactroban(given samples in office) for next 5 days. If not improving will consider oral antibiotic to treat.   Marland KitchenHarlon Flor PA-C, have reviewed and agree with the above documentation in it's entirety.

## 2019-10-15 ENCOUNTER — Encounter: Payer: Self-pay | Admitting: Physician Assistant

## 2019-10-15 DIAGNOSIS — M545 Low back pain, unspecified: Secondary | ICD-10-CM | POA: Insufficient documentation

## 2019-10-15 DIAGNOSIS — G8929 Other chronic pain: Secondary | ICD-10-CM | POA: Insufficient documentation

## 2019-10-27 DIAGNOSIS — I4892 Unspecified atrial flutter: Secondary | ICD-10-CM | POA: Diagnosis not present

## 2019-10-27 DIAGNOSIS — I4891 Unspecified atrial fibrillation: Secondary | ICD-10-CM | POA: Diagnosis not present

## 2019-10-27 DIAGNOSIS — I10 Essential (primary) hypertension: Secondary | ICD-10-CM | POA: Diagnosis not present

## 2019-10-27 DIAGNOSIS — G4733 Obstructive sleep apnea (adult) (pediatric): Secondary | ICD-10-CM | POA: Diagnosis not present

## 2019-10-29 ENCOUNTER — Other Ambulatory Visit: Payer: Self-pay | Admitting: Physician Assistant

## 2019-10-29 DIAGNOSIS — G4452 New daily persistent headache (NDPH): Secondary | ICD-10-CM

## 2019-11-03 DIAGNOSIS — Z79899 Other long term (current) drug therapy: Secondary | ICD-10-CM | POA: Diagnosis not present

## 2019-11-09 ENCOUNTER — Encounter: Payer: Self-pay | Admitting: Physician Assistant

## 2019-11-10 MED ORDER — DOXYCYCLINE HYCLATE 100 MG PO TABS: 100.0000 mg | ORAL_TABLET | Freq: Two times a day (BID) | ORAL | 0 refills | Status: AC

## 2019-11-16 DIAGNOSIS — N183 Chronic kidney disease, stage 3 unspecified: Secondary | ICD-10-CM | POA: Diagnosis not present

## 2019-11-16 DIAGNOSIS — I10 Essential (primary) hypertension: Secondary | ICD-10-CM | POA: Diagnosis not present

## 2019-11-16 DIAGNOSIS — Z01818 Encounter for other preprocedural examination: Secondary | ICD-10-CM | POA: Diagnosis not present

## 2019-11-16 DIAGNOSIS — M1711 Unilateral primary osteoarthritis, right knee: Secondary | ICD-10-CM | POA: Diagnosis not present

## 2019-11-16 DIAGNOSIS — E559 Vitamin D deficiency, unspecified: Secondary | ICD-10-CM | POA: Diagnosis not present

## 2019-11-22 DIAGNOSIS — N183 Chronic kidney disease, stage 3 unspecified: Secondary | ICD-10-CM | POA: Diagnosis not present

## 2019-11-22 DIAGNOSIS — G4733 Obstructive sleep apnea (adult) (pediatric): Secondary | ICD-10-CM | POA: Diagnosis not present

## 2019-11-22 DIAGNOSIS — I4891 Unspecified atrial fibrillation: Secondary | ICD-10-CM | POA: Diagnosis not present

## 2019-11-24 DIAGNOSIS — Z888 Allergy status to other drugs, medicaments and biological substances status: Secondary | ICD-10-CM | POA: Diagnosis not present

## 2019-11-24 DIAGNOSIS — I129 Hypertensive chronic kidney disease with stage 1 through stage 4 chronic kidney disease, or unspecified chronic kidney disease: Secondary | ICD-10-CM | POA: Diagnosis not present

## 2019-11-24 DIAGNOSIS — E785 Hyperlipidemia, unspecified: Secondary | ICD-10-CM | POA: Diagnosis not present

## 2019-11-24 DIAGNOSIS — M1711 Unilateral primary osteoarthritis, right knee: Secondary | ICD-10-CM | POA: Diagnosis not present

## 2019-11-24 DIAGNOSIS — Z79899 Other long term (current) drug therapy: Secondary | ICD-10-CM | POA: Diagnosis not present

## 2019-11-24 DIAGNOSIS — Z6841 Body Mass Index (BMI) 40.0 and over, adult: Secondary | ICD-10-CM | POA: Diagnosis not present

## 2019-11-24 DIAGNOSIS — F32A Depression, unspecified: Secondary | ICD-10-CM | POA: Diagnosis not present

## 2019-11-24 DIAGNOSIS — M5136 Other intervertebral disc degeneration, lumbar region: Secondary | ICD-10-CM | POA: Diagnosis not present

## 2019-11-24 DIAGNOSIS — I480 Paroxysmal atrial fibrillation: Secondary | ICD-10-CM | POA: Diagnosis not present

## 2019-11-24 DIAGNOSIS — N183 Chronic kidney disease, stage 3 unspecified: Secondary | ICD-10-CM | POA: Diagnosis not present

## 2019-11-24 DIAGNOSIS — Z96653 Presence of artificial knee joint, bilateral: Secondary | ICD-10-CM | POA: Diagnosis not present

## 2019-11-24 DIAGNOSIS — Z885 Allergy status to narcotic agent status: Secondary | ICD-10-CM | POA: Diagnosis not present

## 2019-11-24 DIAGNOSIS — G4733 Obstructive sleep apnea (adult) (pediatric): Secondary | ICD-10-CM | POA: Diagnosis not present

## 2019-11-26 DIAGNOSIS — I10 Essential (primary) hypertension: Secondary | ICD-10-CM | POA: Diagnosis not present

## 2019-12-02 ENCOUNTER — Encounter: Payer: Self-pay | Admitting: Physician Assistant

## 2019-12-02 DIAGNOSIS — G4452 New daily persistent headache (NDPH): Secondary | ICD-10-CM

## 2019-12-03 MED ORDER — BACLOFEN 10 MG PO TABS: ORAL_TABLET | ORAL | 0 refills | Status: DC

## 2019-12-13 DIAGNOSIS — Z9889 Other specified postprocedural states: Secondary | ICD-10-CM | POA: Diagnosis not present

## 2019-12-13 DIAGNOSIS — N183 Chronic kidney disease, stage 3 unspecified: Secondary | ICD-10-CM | POA: Diagnosis not present

## 2019-12-13 DIAGNOSIS — Z713 Dietary counseling and surveillance: Secondary | ICD-10-CM | POA: Diagnosis not present

## 2019-12-13 DIAGNOSIS — R112 Nausea with vomiting, unspecified: Secondary | ICD-10-CM | POA: Diagnosis not present

## 2020-03-08 ENCOUNTER — Other Ambulatory Visit: Payer: Self-pay | Admitting: Physician Assistant

## 2020-03-08 DIAGNOSIS — G4452 New daily persistent headache (NDPH): Secondary | ICD-10-CM

## 2020-05-17 ENCOUNTER — Other Ambulatory Visit: Payer: Self-pay | Admitting: Neurology

## 2020-05-17 DIAGNOSIS — F3342 Major depressive disorder, recurrent, in full remission: Secondary | ICD-10-CM

## 2020-06-06 ENCOUNTER — Other Ambulatory Visit: Payer: Self-pay | Admitting: Physician Assistant

## 2020-06-06 DIAGNOSIS — F3342 Major depressive disorder, recurrent, in full remission: Secondary | ICD-10-CM

## 2020-06-06 DIAGNOSIS — G4452 New daily persistent headache (NDPH): Secondary | ICD-10-CM

## 2021-10-30 IMAGING — US US BREAST*L* LIMITED INC AXILLA
1 series · 6 of 6 positions shown · non-contrast
Comparison: Previous exam(s).

CLINICAL DATA: Recall from screening for a possible left breast
mass.

EXAM:
ULTRASOUND OF THE LEFT BREAST

[Series 1: us breast*left* limited inc axilla · 0.08mm/px · 6 of 6 slices shown]
[im 1/6]
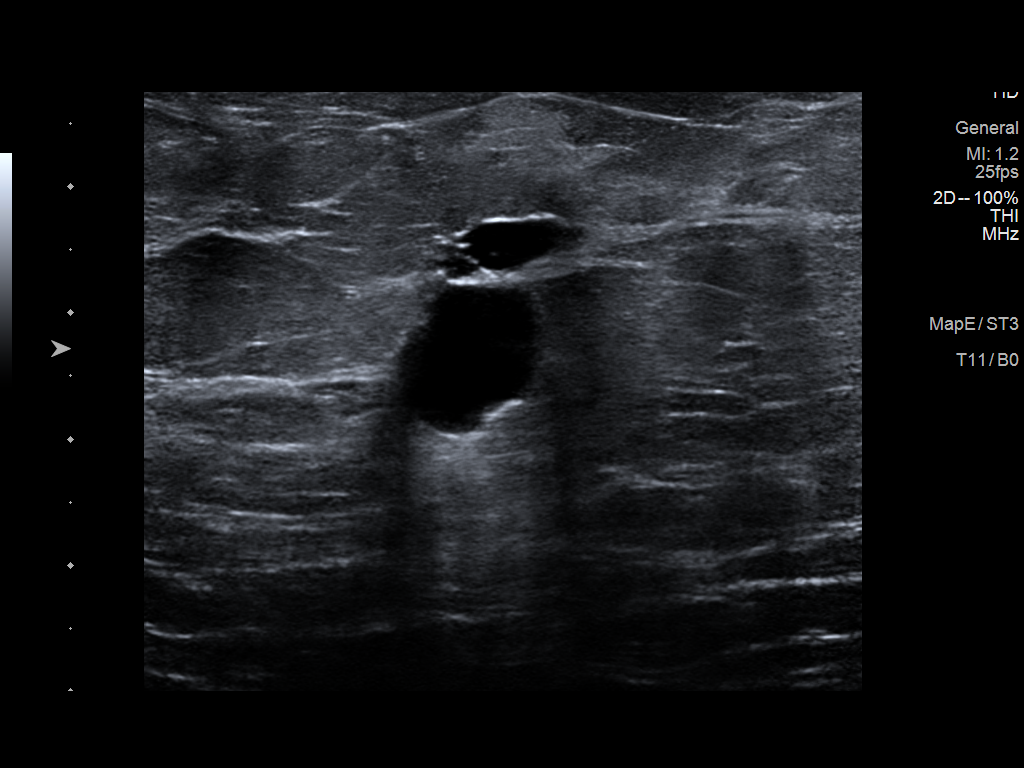
[im 2/6]
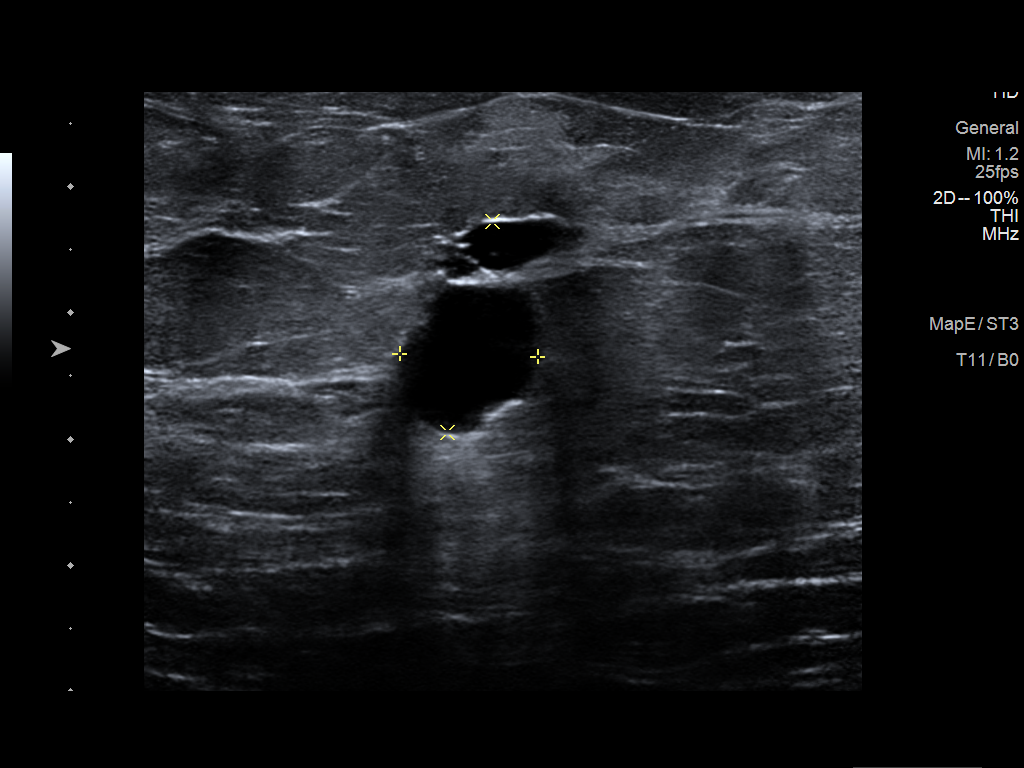
[im 3/6]
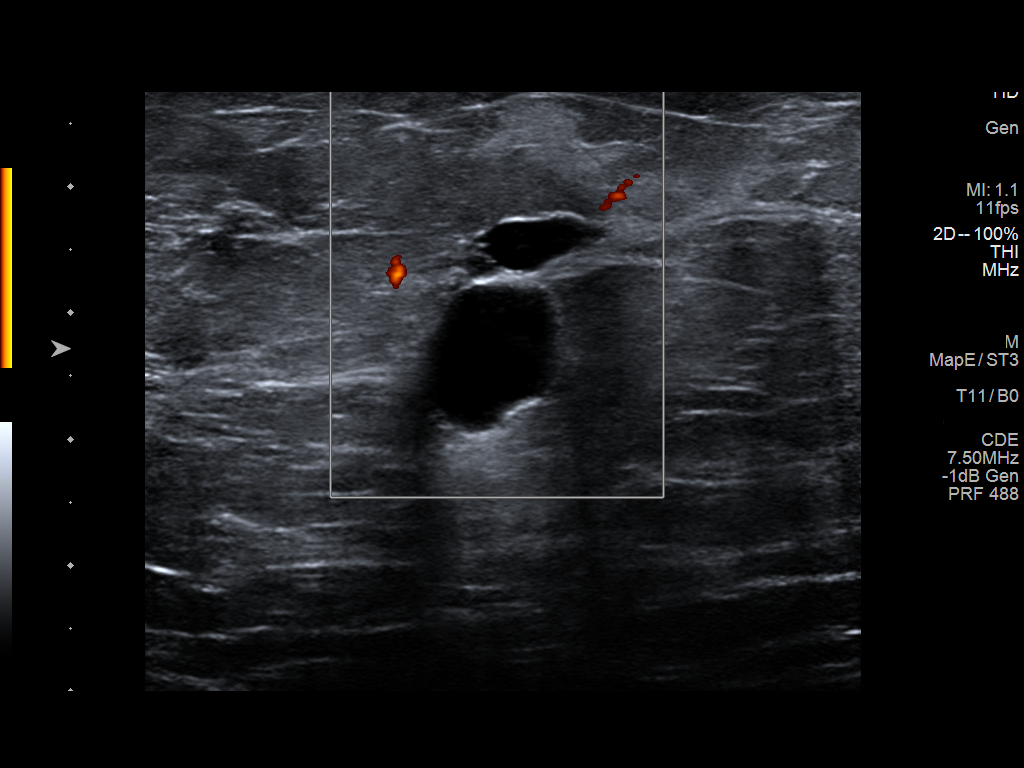
[im 4/6]
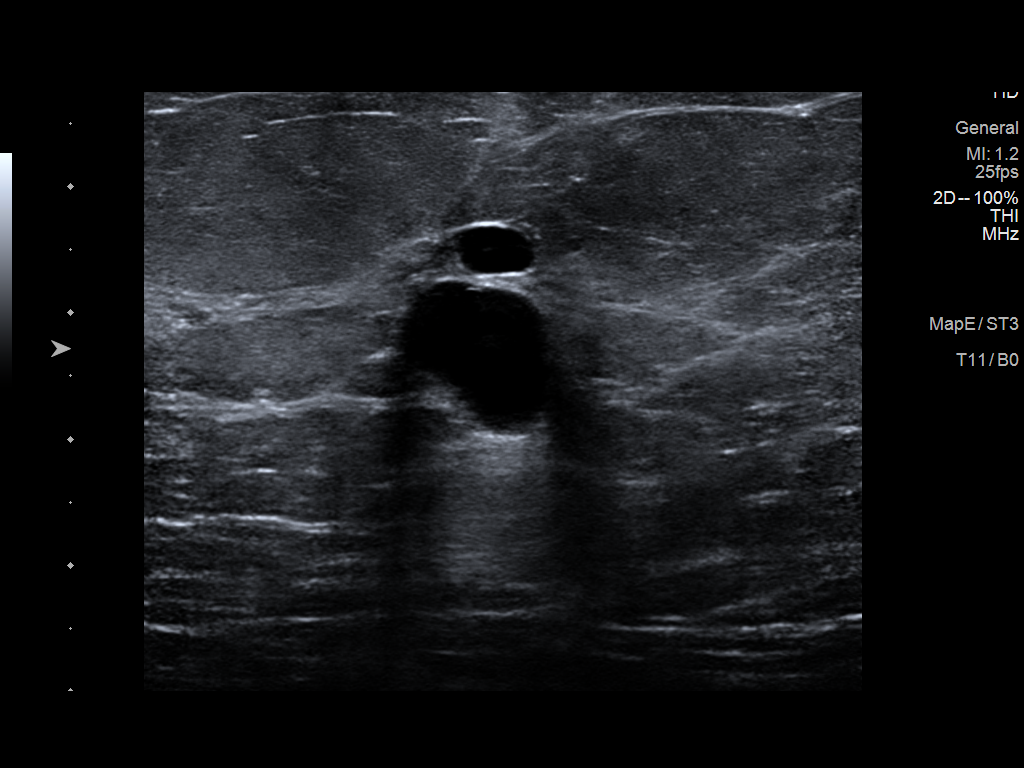
[im 5/6]
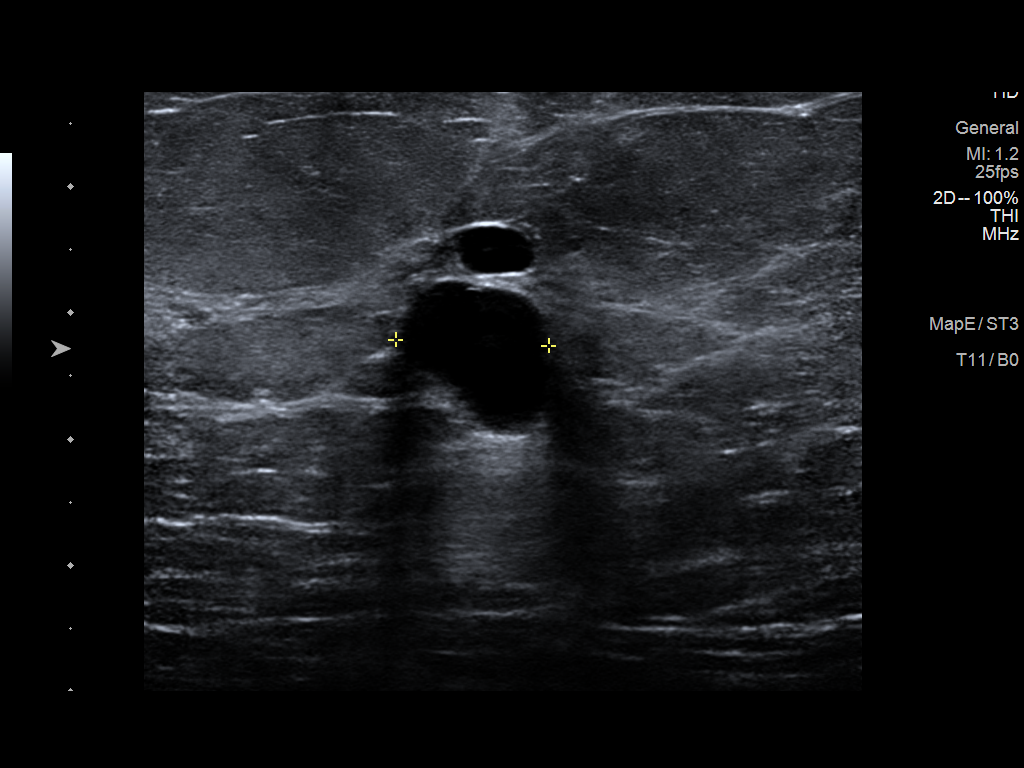
[im 6/6]
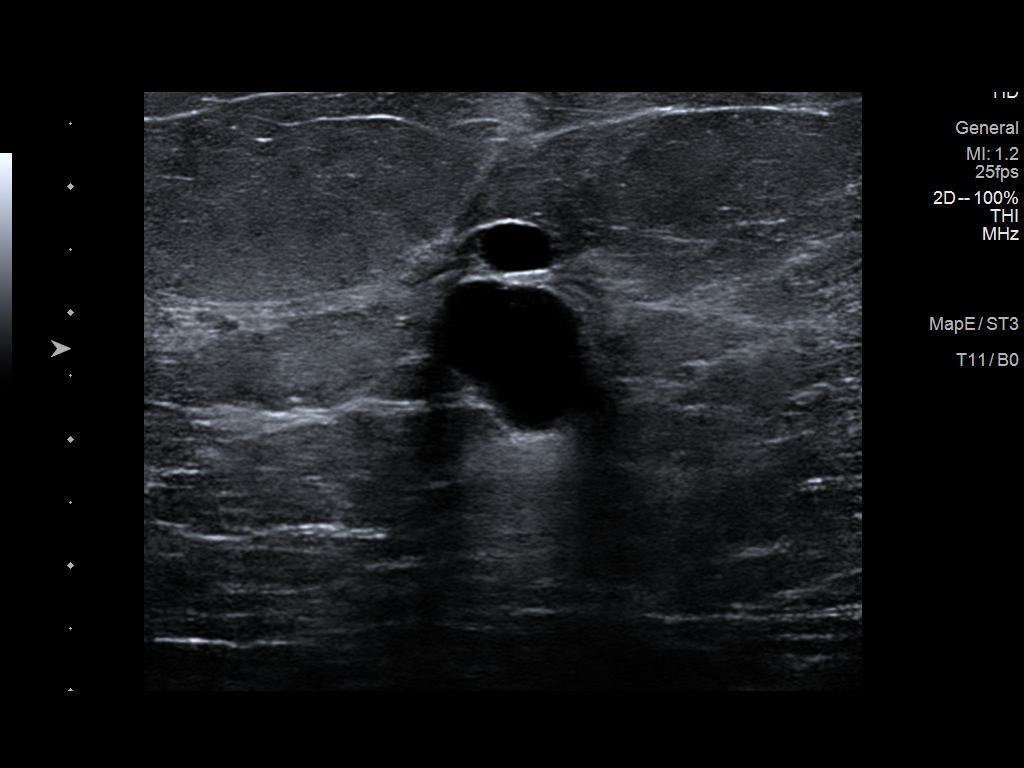

[6 of 6 positions shown; findings below may reference images not displayed]

FINDINGS: Targeted ultrasound is performed, showing 2 adjacent cysts in the
left breast at 2 o'clock, 7 cm the nipple, spanning 1.7 x 1.2 x
cm. This accounts for the mammographic mass. There are no solid
masses or suspicious lesions.
IMPRESSION: 1. No evidence of breast malignancy.
2. Benign left breast cyst.

RECOMMENDATION:
Screening mammogram in one year.(Code:47-[id number])

I have discussed the findings and recommendations with the patient.
If applicable, a reminder letter will be sent to the patient
regarding the next appointment.

BI-RADS CATEGORY  2: Benign.

## 2022-03-06 IMAGING — US US THYROID
1 series · 13 of 25 positions shown · non-contrast
Comparison: None.

CLINICAL DATA: Incidental on CT. Thyroid nodule incidentally noted
on outside CT. History previous parathyroidectomy.

EXAM:
THYROID ULTRASOUND
TECHNIQUE: Ultrasound examination of the thyroid gland and adjacent soft
tissues was performed.

[Series 1: us thyroid · 0.07mm/px · 13 of 57 slices shown]
[im 1/57]
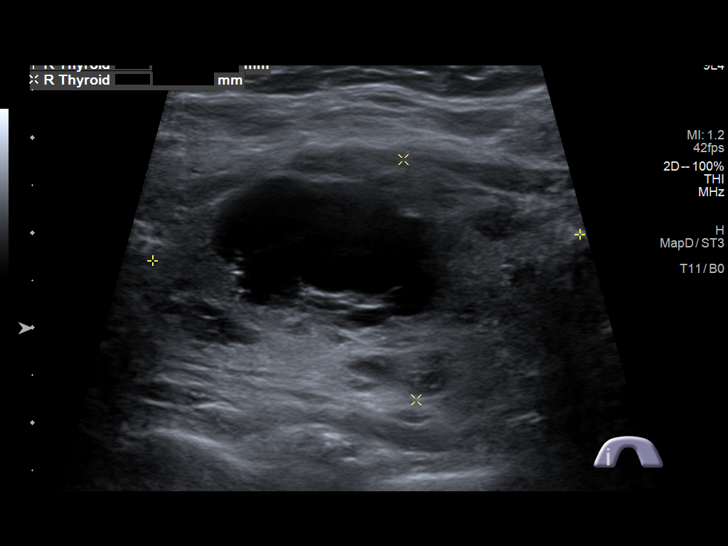
[im 5/57]
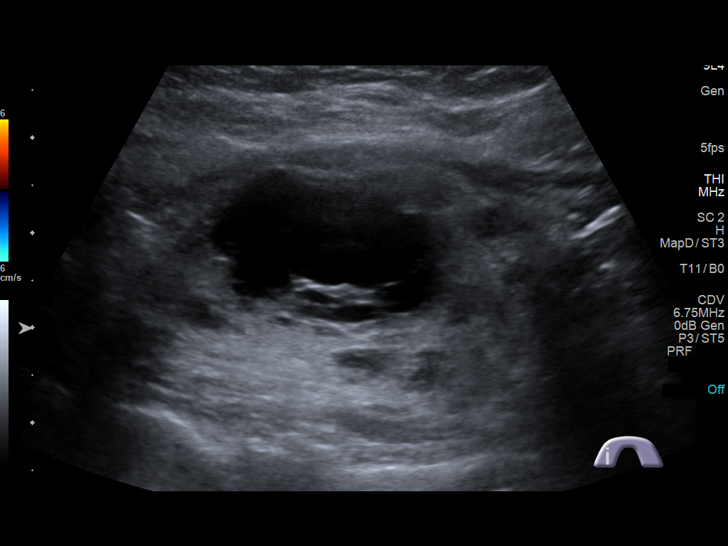
[im 10/57]
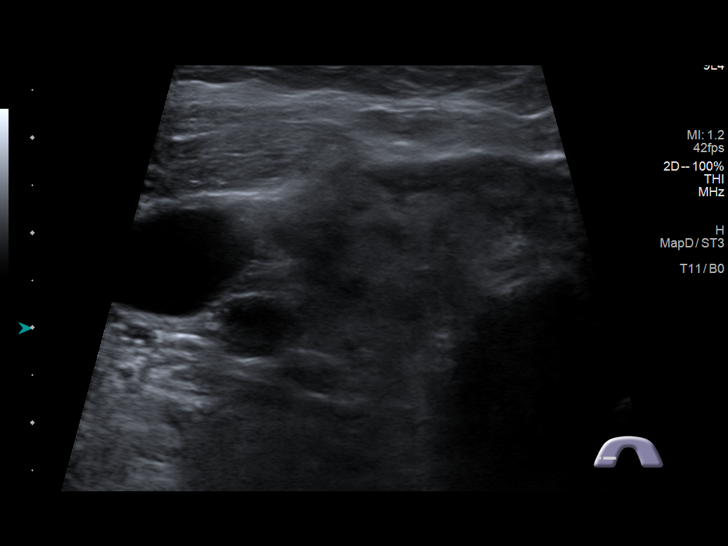
[im 15/57]
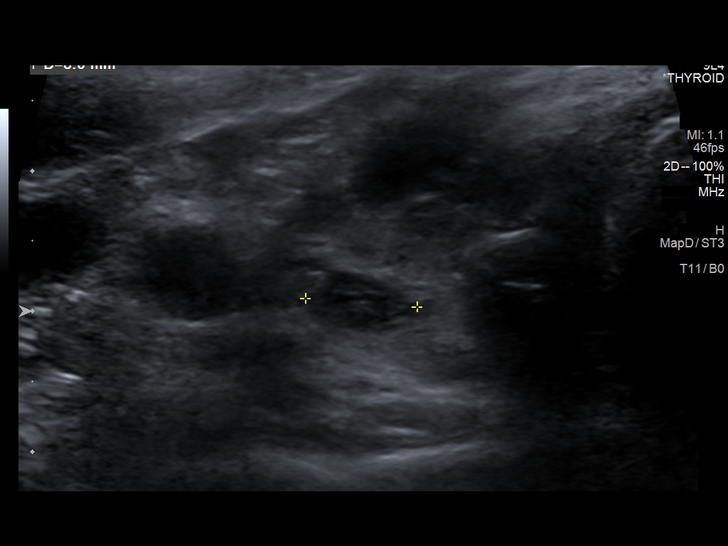
[im 19/57]
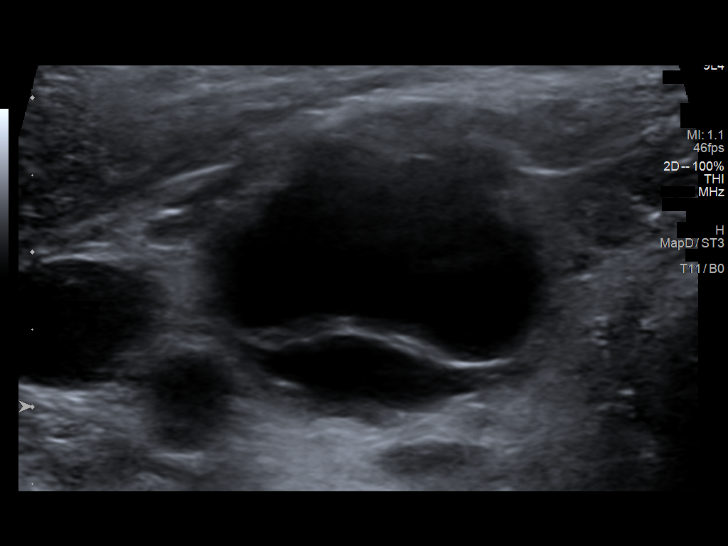
[im 24/57]
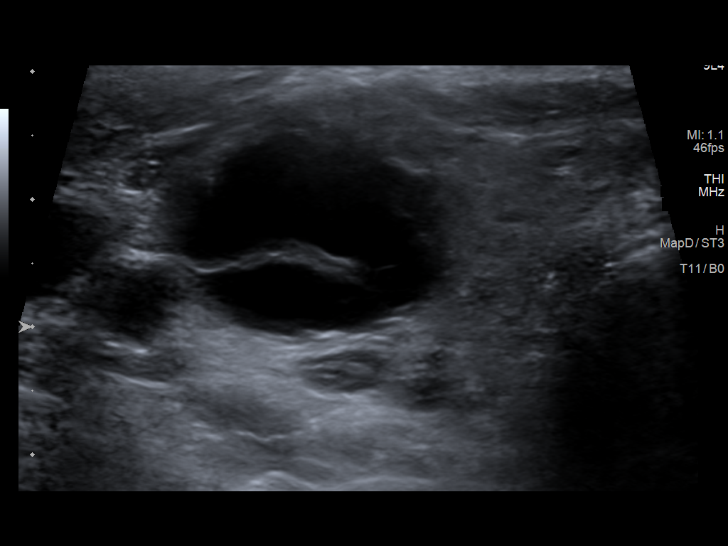
[im 29/57]
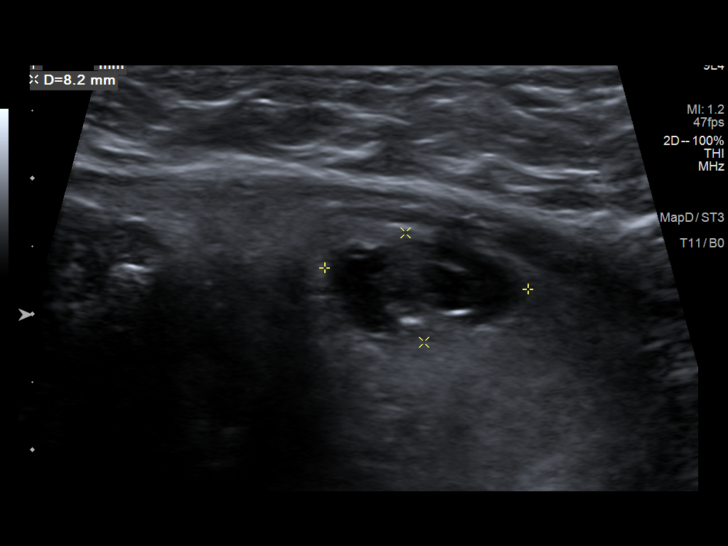
[im 33/57]
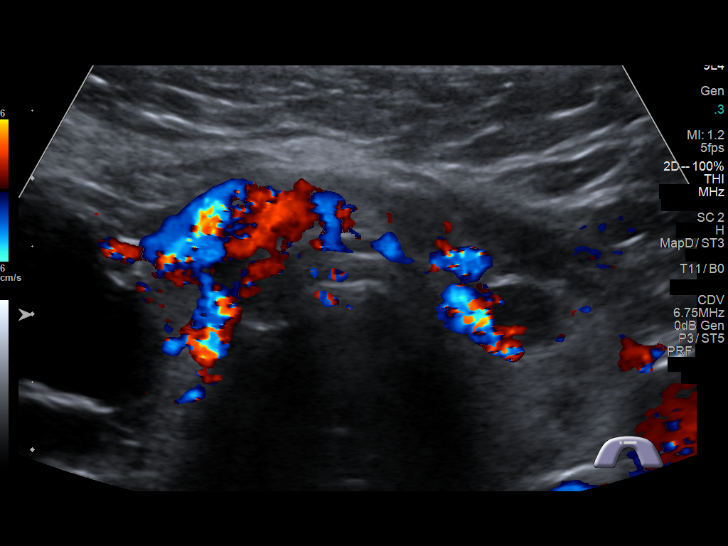
[im 38/57]
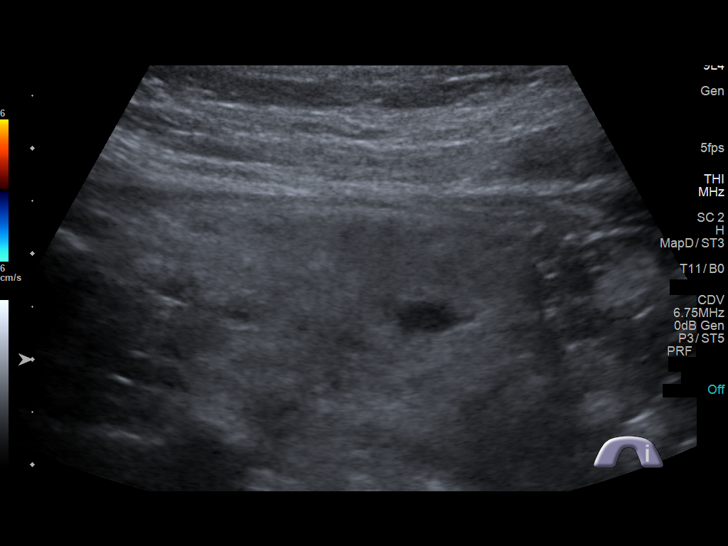
[im 43/57]
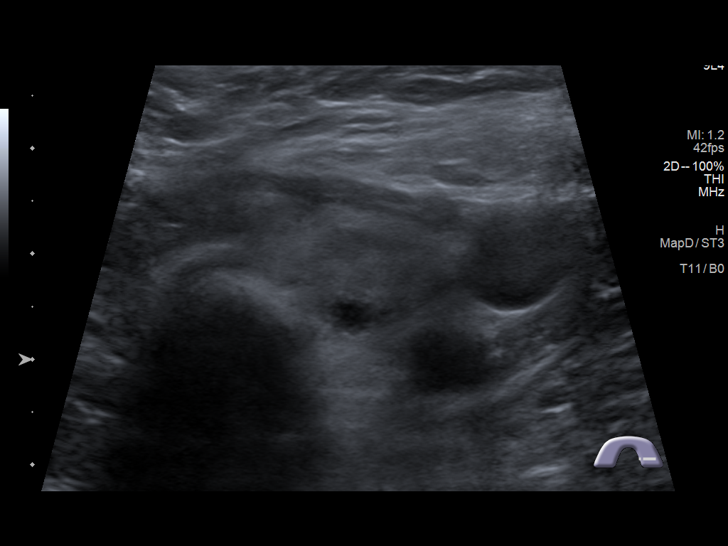
[im 47/57]
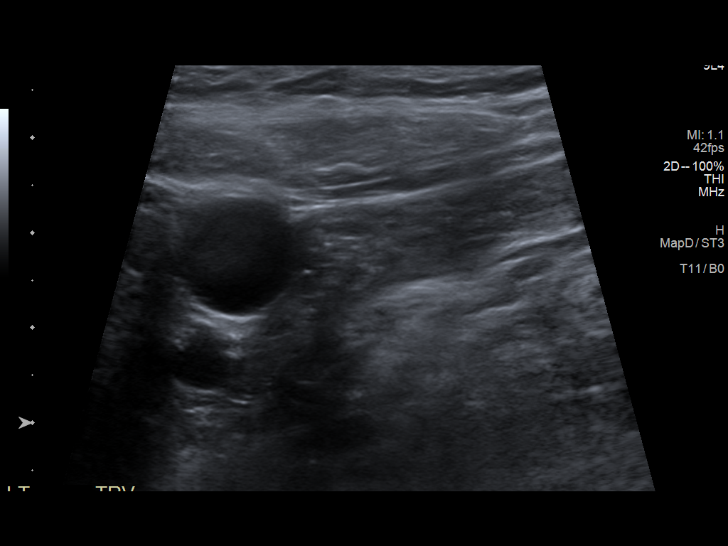
[im 52/57]
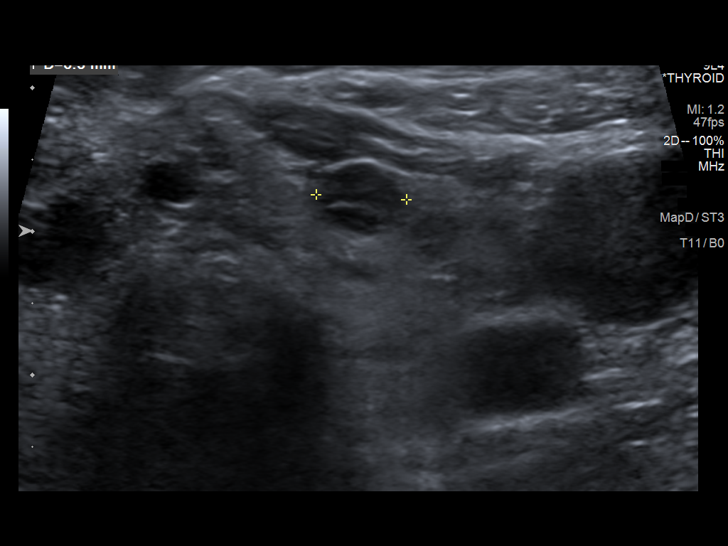
[im 57/57]
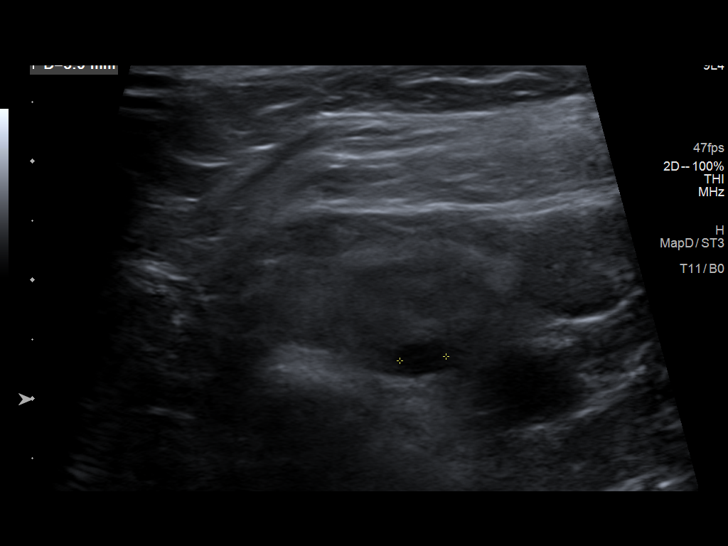

[13 of 25 positions shown; findings below may reference images not displayed]

FINDINGS: Parenchymal Echotexture: Mildly heterogenous

Isthmus: Normal in size measuring 0.7 cm in diameter

Right lobe: Normal in size measuring 4.5 x 2.5 x 3.0 cm

Left lobe: Normal in size measuring 3.9 x 2.0 x 1.7 cm

_________________________________________________________

Estimated total number of nodules >/= 1 cm: 5

Number of spongiform nodules >/=  2 cm not described below (TR1): 0

Number of mixed cystic and solid nodules >/= 1.5 cm not described
below (TR2): 0

_________________________________________________________

There is an approximately 1.0 cm spongiform/benign-appearing nodule
within the superior pole of the right lobe thyroid (labeled 1),
which does not meet criteria to recommend percutaneous sampling or
continued dedicated follow-up.

There is an approximately 2.9 x 2.3 x 1.7 cm minimally complex cyst
within the mid aspect the right lobe of the thyroid (labeled 2),
which does not meet criteria to recommend percutaneous sampling or
continued dedicated follow-up.

There is an approximately 1.0 cm spongiform/benign-appearing nodule
within the inferior pole the right lobe of the thyroid (labeled 3),
which contains an internal echogenic foci ring down artifact
compatible with benign colloid. This nodule does not meet criteria
to recommend percutaneous sampling or continued dedicated follow-up.

_________________________________________________________

There is an approximately 1.5 x 1.3 x 0.8 cm partially solid,
partially cystic nodule within the thyroid isthmus (labeled 4),
which does not meet imaging criteria to recommend percutaneous
sampling or continued dedicated follow-up.

_________________________________________________________

There is an approximately 1.1 x 0.6 x 0.5 cm partially solid,
partially cystic nodule within the superior pole the left lobe of
the thyroid (labeled 5), which does not meet criteria to recommend
percutaneous sampling or continued dedicated follow-up

There is an approximately 0.6 cm anechoic cyst within the mid,
posterior aspect the left lobe of the thyroid (labeled 6), which
does not meet imaging criteria to recommend percutaneous sampling or
continued dedicated follow-up.
IMPRESSION: 1. Findings suggestive of multinodular goiter.
2. None of the discretely measured thyroid nodules/cysts, including
the dominant approximately 2.9 cm minimally complex cyst within the
right lobe of the thyroid, meet imaging criteria to recommend
percutaneous sampling or continued dedicated follow-up.

The above is in keeping with the ACR TI-RADS recommendations - [HOSPITAL] 3997;[DATE].
